# Patient Record
Sex: Female | Born: 1965 | Race: Black or African American | Hispanic: No | Marital: Single | Smoking: Former smoker
Health system: Southern US, Community
[De-identification: ages and names within clinical notes are randomized; demographics above are authoritative.]

## PROBLEM LIST (undated history)

## (undated) DIAGNOSIS — I10 Essential (primary) hypertension: Secondary | ICD-10-CM

## (undated) DIAGNOSIS — E039 Hypothyroidism, unspecified: Secondary | ICD-10-CM

## (undated) DIAGNOSIS — E785 Hyperlipidemia, unspecified: Secondary | ICD-10-CM

## (undated) DIAGNOSIS — I639 Cerebral infarction, unspecified: Secondary | ICD-10-CM

## (undated) DIAGNOSIS — T8859XA Other complications of anesthesia, initial encounter: Secondary | ICD-10-CM

## (undated) DIAGNOSIS — J449 Chronic obstructive pulmonary disease, unspecified: Secondary | ICD-10-CM

## (undated) DIAGNOSIS — N200 Calculus of kidney: Secondary | ICD-10-CM

## (undated) DIAGNOSIS — F329 Major depressive disorder, single episode, unspecified: Secondary | ICD-10-CM

## (undated) DIAGNOSIS — Z9889 Other specified postprocedural states: Secondary | ICD-10-CM

## (undated) DIAGNOSIS — F32A Depression, unspecified: Secondary | ICD-10-CM

## (undated) DIAGNOSIS — R112 Nausea with vomiting, unspecified: Secondary | ICD-10-CM

## (undated) HISTORY — PX: CHOLECYSTECTOMY: SHX55

## (undated) HISTORY — DX: Cerebral infarction, unspecified: I63.9

## (undated) HISTORY — DX: Hyperlipidemia, unspecified: E78.5

## (undated) HISTORY — DX: Depression, unspecified: F32.A

## (undated) HISTORY — DX: Major depressive disorder, single episode, unspecified: F32.9

## (undated) HISTORY — PX: TUBAL LIGATION: SHX77

## (undated) HISTORY — PX: ABDOMINAL HYSTERECTOMY: SHX81

---

## 2001-03-28 ENCOUNTER — Emergency Department (HOSPITAL_COMMUNITY): Admission: EM | Admit: 2001-03-28 | Discharge: 2001-03-28 | Payer: Self-pay | Admitting: Emergency Medicine

## 2001-04-05 ENCOUNTER — Emergency Department (HOSPITAL_COMMUNITY): Admission: EM | Admit: 2001-04-05 | Discharge: 2001-04-05 | Payer: Self-pay | Admitting: Emergency Medicine

## 2001-04-06 ENCOUNTER — Emergency Department (HOSPITAL_COMMUNITY): Admission: EM | Admit: 2001-04-06 | Discharge: 2001-04-06 | Payer: Self-pay | Admitting: Emergency Medicine

## 2001-04-20 ENCOUNTER — Encounter: Payer: Self-pay | Admitting: Emergency Medicine

## 2001-04-20 ENCOUNTER — Emergency Department (HOSPITAL_COMMUNITY): Admission: EM | Admit: 2001-04-20 | Discharge: 2001-04-20 | Payer: Self-pay | Admitting: Emergency Medicine

## 2001-08-01 ENCOUNTER — Encounter: Payer: Self-pay | Admitting: Emergency Medicine

## 2001-08-01 ENCOUNTER — Emergency Department (HOSPITAL_COMMUNITY): Admission: EM | Admit: 2001-08-01 | Discharge: 2001-08-01 | Payer: Self-pay | Admitting: Emergency Medicine

## 2001-11-20 ENCOUNTER — Emergency Department (HOSPITAL_COMMUNITY): Admission: EM | Admit: 2001-11-20 | Discharge: 2001-11-20 | Payer: Self-pay | Admitting: Emergency Medicine

## 2001-12-02 ENCOUNTER — Encounter: Payer: Self-pay | Admitting: Internal Medicine

## 2001-12-02 ENCOUNTER — Ambulatory Visit (HOSPITAL_COMMUNITY): Admission: RE | Admit: 2001-12-02 | Discharge: 2001-12-02 | Payer: Self-pay | Admitting: Internal Medicine

## 2002-03-22 ENCOUNTER — Encounter: Payer: Self-pay | Admitting: Emergency Medicine

## 2002-03-22 ENCOUNTER — Emergency Department (HOSPITAL_COMMUNITY): Admission: EM | Admit: 2002-03-22 | Discharge: 2002-03-22 | Payer: Self-pay | Admitting: Emergency Medicine

## 2003-03-17 ENCOUNTER — Emergency Department (HOSPITAL_COMMUNITY): Admission: EM | Admit: 2003-03-17 | Discharge: 2003-03-17 | Payer: Self-pay | Admitting: Emergency Medicine

## 2003-03-17 ENCOUNTER — Encounter: Payer: Self-pay | Admitting: Emergency Medicine

## 2003-04-13 ENCOUNTER — Encounter: Payer: Self-pay | Admitting: Emergency Medicine

## 2003-04-13 ENCOUNTER — Emergency Department (HOSPITAL_COMMUNITY): Admission: EM | Admit: 2003-04-13 | Discharge: 2003-04-13 | Payer: Self-pay | Admitting: Emergency Medicine

## 2006-08-26 ENCOUNTER — Emergency Department (HOSPITAL_COMMUNITY): Admission: EM | Admit: 2006-08-26 | Discharge: 2006-08-26 | Payer: Self-pay | Admitting: Advanced Practice Midwife

## 2006-08-30 ENCOUNTER — Ambulatory Visit (HOSPITAL_COMMUNITY): Admission: RE | Admit: 2006-08-30 | Discharge: 2006-08-30 | Payer: Self-pay | Admitting: Emergency Medicine

## 2008-02-20 ENCOUNTER — Emergency Department (HOSPITAL_COMMUNITY): Admission: EM | Admit: 2008-02-20 | Discharge: 2008-02-20 | Payer: Self-pay | Admitting: Emergency Medicine

## 2008-05-21 ENCOUNTER — Emergency Department (HOSPITAL_COMMUNITY): Admission: EM | Admit: 2008-05-21 | Discharge: 2008-05-21 | Payer: Self-pay | Admitting: Emergency Medicine

## 2009-10-11 ENCOUNTER — Emergency Department (HOSPITAL_COMMUNITY): Admission: EM | Admit: 2009-10-11 | Discharge: 2009-10-11 | Payer: Self-pay | Admitting: Emergency Medicine

## 2009-11-09 ENCOUNTER — Emergency Department (HOSPITAL_COMMUNITY): Admission: EM | Admit: 2009-11-09 | Discharge: 2009-11-09 | Payer: Self-pay | Admitting: Emergency Medicine

## 2010-05-15 ENCOUNTER — Emergency Department (HOSPITAL_COMMUNITY): Admission: EM | Admit: 2010-05-15 | Discharge: 2010-05-15 | Payer: Self-pay | Admitting: Emergency Medicine

## 2010-08-28 ENCOUNTER — Emergency Department (HOSPITAL_COMMUNITY)
Admission: EM | Admit: 2010-08-28 | Discharge: 2010-08-28 | Disposition: A | Discharge status: Home / Self Care | Payer: Self-pay | Attending: Emergency Medicine | Admitting: Emergency Medicine

## 2010-08-28 DIAGNOSIS — Z87891 Personal history of nicotine dependence: Secondary | ICD-10-CM | POA: Insufficient documentation

## 2010-08-28 DIAGNOSIS — K089 Disorder of teeth and supporting structures, unspecified: Secondary | ICD-10-CM | POA: Insufficient documentation

## 2010-09-09 LAB — URINALYSIS, ROUTINE W REFLEX MICROSCOPIC
Glucose, UA: NEGATIVE mg/dL
Hgb urine dipstick: NEGATIVE
Ketones, ur: NEGATIVE mg/dL
Specific Gravity, Urine: >1.03 — ABNORMAL HIGH (ref 1.005–1.030)
pH: 5.5 (ref 5.0–8.0)

## 2010-09-09 LAB — DIFFERENTIAL
Basophils Absolute: 0.1 10*3/uL (ref 0.0–0.1)
Basophils Relative: 1 % (ref 0–1)
Eosinophils Relative: 1 % (ref 0–5)
Lymphocytes Relative: 25 % (ref 12–46)
Lymphs Abs: 2.2 10*3/uL (ref 0.7–4.0)
Monocytes Absolute: 0.7 10*3/uL (ref 0.1–1.0)
Monocytes Relative: 8 % (ref 3–12)
Neutrophils Relative %: 65 % (ref 43–77)

## 2010-09-09 LAB — CBC
HCT: 20.6 % — ABNORMAL LOW (ref 36.0–46.0)
MCV: 58.5 fL — ABNORMAL LOW (ref 78.0–100.0)
RBC: 3.52 MIL/uL — ABNORMAL LOW (ref 3.87–5.11)
WBC: 8.7 10*3/uL (ref 4.0–10.5)

## 2010-09-09 LAB — BASIC METABOLIC PANEL
CO2: 25 mEq/L (ref 19–32)
Calcium: 9.4 mg/dL (ref 8.4–10.5)
Chloride: 104 mEq/L (ref 96–112)
Glucose, Bld: 98 mg/dL (ref 70–99)

## 2010-10-18 ENCOUNTER — Emergency Department (HOSPITAL_COMMUNITY)
Admission: EM | Admit: 2010-10-18 | Discharge: 2010-10-18 | Disposition: A | Discharge status: Home / Self Care | Payer: Self-pay | Attending: Emergency Medicine | Admitting: Emergency Medicine

## 2010-10-18 DIAGNOSIS — R197 Diarrhea, unspecified: Secondary | ICD-10-CM | POA: Insufficient documentation

## 2010-10-18 DIAGNOSIS — K5289 Other specified noninfective gastroenteritis and colitis: Secondary | ICD-10-CM | POA: Insufficient documentation

## 2010-10-18 DIAGNOSIS — R112 Nausea with vomiting, unspecified: Secondary | ICD-10-CM | POA: Insufficient documentation

## 2010-10-18 LAB — BASIC METABOLIC PANEL
CO2: 24 mEq/L (ref 19–32)
Calcium: 8.8 mg/dL (ref 8.4–10.5)
Chloride: 105 mEq/L (ref 96–112)
Creatinine, Ser: 0.66 mg/dL (ref 0.4–1.2)
GFR calc Af Amer: >60 mL/min (ref >60)
GFR calc non Af Amer: >60 mL/min (ref >60)
Glucose, Bld: 94 mg/dL (ref 70–99)
Sodium: 132 mEq/L — ABNORMAL LOW (ref 135–145)

## 2010-10-18 LAB — URINALYSIS, ROUTINE W REFLEX MICROSCOPIC
Bilirubin Urine: NEGATIVE
Ketones, ur: NEGATIVE mg/dL
Nitrite: NEGATIVE
Protein, ur: NEGATIVE mg/dL
Urobilinogen, UA: 0.2 mg/dL (ref 0.0–1.0)

## 2010-10-18 LAB — URINE MICROSCOPIC-ADD ON

## 2010-10-18 LAB — CBC
HCT: 21.7 % — ABNORMAL LOW (ref 36.0–46.0)
Hemoglobin: 5.6 g/dL — CL (ref 12.0–15.0)
MCH: 16.1 pg — ABNORMAL LOW (ref 26.0–34.0)
MCV: 62.5 fL — ABNORMAL LOW (ref 78.0–100.0)
RBC: 3.47 MIL/uL — ABNORMAL LOW (ref 3.87–5.11)
RDW: 20.9 % — ABNORMAL HIGH (ref 11.5–15.5)

## 2010-10-18 LAB — DIFFERENTIAL
Basophils Absolute: 0 10*3/uL (ref 0.0–0.1)
Lymphocytes Relative: 40 % (ref 12–46)

## 2010-10-19 LAB — IRON AND TIBC

## 2010-10-20 LAB — URINE CULTURE
Colony Count: NO GROWTH
Culture  Setup Time: 201204221937

## 2010-10-23 ENCOUNTER — Inpatient Hospital Stay (HOSPITAL_COMMUNITY)
Admission: RE | Admit: 2010-10-23 | Discharge: 2010-10-24 | DRG: 812 | Disposition: A | Discharge status: Home / Self Care | Payer: Self-pay | Source: Ambulatory Visit | Attending: Obstetrics & Gynecology | Admitting: Obstetrics & Gynecology

## 2010-10-23 ENCOUNTER — Other Ambulatory Visit: Payer: Self-pay | Admitting: Obstetrics & Gynecology

## 2010-10-23 ENCOUNTER — Other Ambulatory Visit (HOSPITAL_COMMUNITY)
Admission: RE | Admit: 2010-10-23 | Discharge: 2010-10-23 | Disposition: A | Discharge status: Home / Self Care | Payer: Self-pay | Source: Ambulatory Visit | Attending: Obstetrics & Gynecology | Admitting: Obstetrics & Gynecology

## 2010-10-23 DIAGNOSIS — D259 Leiomyoma of uterus, unspecified: Secondary | ICD-10-CM | POA: Diagnosis present

## 2010-10-23 DIAGNOSIS — R87810 Cervical high risk human papillomavirus (HPV) DNA test positive: Secondary | ICD-10-CM | POA: Insufficient documentation

## 2010-10-23 DIAGNOSIS — D649 Anemia, unspecified: Principal | ICD-10-CM | POA: Diagnosis present

## 2010-10-23 DIAGNOSIS — Z124 Encounter for screening for malignant neoplasm of cervix: Secondary | ICD-10-CM | POA: Insufficient documentation

## 2010-10-23 LAB — CBC
MCH: 15.9 pg — ABNORMAL LOW (ref 26.0–34.0)
MCHC: 25.4 g/dL — ABNORMAL LOW (ref 30.0–36.0)
MCV: 62.5 fL — ABNORMAL LOW (ref 78.0–100.0)
Platelets: 371 10*3/uL (ref 150–400)

## 2010-10-23 LAB — COMPREHENSIVE METABOLIC PANEL
AST: 15 U/L (ref 0–37)
Albumin: 3.5 g/dL (ref 3.5–5.2)
Alkaline Phosphatase: 66 U/L (ref 39–117)
CO2: 24 mEq/L (ref 19–32)
Creatinine, Ser: 0.67 mg/dL (ref 0.4–1.2)
GFR calc non Af Amer: >60 mL/min (ref >60)
Glucose, Bld: 112 mg/dL — ABNORMAL HIGH (ref 70–99)
Potassium: 4 mEq/L (ref 3.5–5.1)

## 2010-10-23 LAB — DIFFERENTIAL
Basophils Absolute: 0 10*3/uL (ref 0.0–0.1)
Eosinophils Absolute: 0.2 10*3/uL (ref 0.0–0.7)
Eosinophils Relative: 3 % (ref 0–5)
Lymphocytes Relative: 31 % (ref 12–46)
Lymphs Abs: 2.3 10*3/uL (ref 0.7–4.0)

## 2010-10-23 LAB — HCG, QUANTITATIVE, PREGNANCY

## 2010-10-25 LAB — CROSSMATCH
ABO/RH(D): B POS
Unit division: 0
Unit division: 0

## 2010-10-28 ENCOUNTER — Other Ambulatory Visit: Payer: Self-pay | Admitting: Obstetrics & Gynecology

## 2010-10-28 ENCOUNTER — Encounter (HOSPITAL_COMMUNITY): Payer: Self-pay

## 2010-10-28 LAB — URINALYSIS, ROUTINE W REFLEX MICROSCOPIC
Glucose, UA: NEGATIVE mg/dL
Hgb urine dipstick: NEGATIVE
Specific Gravity, Urine: 1.01 (ref 1.005–1.030)
pH: 6 (ref 5.0–8.0)

## 2010-10-28 LAB — COMPREHENSIVE METABOLIC PANEL
ALT: 11 U/L (ref 0–35)
AST: 15 U/L (ref 0–37)
Albumin: 3.9 g/dL (ref 3.5–5.2)
Alkaline Phosphatase: 88 U/L (ref 39–117)
CO2: 25 mEq/L (ref 19–32)
Chloride: 102 mEq/L (ref 96–112)
Creatinine, Ser: 0.6 mg/dL (ref 0.4–1.2)
GFR calc Af Amer: >60 mL/min (ref >60)
GFR calc non Af Amer: >60 mL/min (ref >60)
Potassium: 3.9 mEq/L (ref 3.5–5.1)
Sodium: 137 mEq/L (ref 135–145)
Total Bilirubin: 0.8 mg/dL (ref 0.3–1.2)

## 2010-10-28 LAB — CBC
HCT: 33.1 % — ABNORMAL LOW (ref 36.0–46.0)
MCH: 19.8 pg — ABNORMAL LOW (ref 26.0–34.0)
MCV: 69.8 fL — ABNORMAL LOW (ref 78.0–100.0)
RDW: 26.7 % — ABNORMAL HIGH (ref 11.5–15.5)
WBC: 6.3 10*3/uL (ref 4.0–10.5)

## 2010-10-29 ENCOUNTER — Other Ambulatory Visit: Payer: Self-pay | Admitting: Obstetrics & Gynecology

## 2010-10-29 ENCOUNTER — Ambulatory Visit (HOSPITAL_COMMUNITY)
Admission: RE | Admit: 2010-10-29 | Discharge: 2010-10-29 | Disposition: A | Discharge status: Home / Self Care | Payer: Self-pay | Source: Ambulatory Visit | Attending: Obstetrics & Gynecology | Admitting: Obstetrics & Gynecology

## 2010-10-29 DIAGNOSIS — D509 Iron deficiency anemia, unspecified: Secondary | ICD-10-CM | POA: Insufficient documentation

## 2010-10-29 DIAGNOSIS — D259 Leiomyoma of uterus, unspecified: Secondary | ICD-10-CM | POA: Insufficient documentation

## 2010-10-29 DIAGNOSIS — N920 Excessive and frequent menstruation with regular cycle: Secondary | ICD-10-CM | POA: Insufficient documentation

## 2010-10-29 DIAGNOSIS — N838 Other noninflammatory disorders of ovary, fallopian tube and broad ligament: Secondary | ICD-10-CM | POA: Insufficient documentation

## 2010-11-02 LAB — TYPE AND SCREEN
ABO/RH(D): B POS
Antibody Screen: NEGATIVE
Unit division: 0
Unit division: 0

## 2010-11-21 NOTE — Op Note (Signed)
Barbara Snow, Barbara Snow              ACCOUNT NO.:  [id number]  MEDICAL RECORD NO.:  [id number]           PATIENT TYPE:  O  LOCATION:  [facility]YP                          FACILITY:  [facility]  PHYSICIAN:  Lazaro Arms, M.D.   DATE OF BIRTH:  13-Jun-1966  DATE OF PROCEDURE:  10/29/2010 DATE OF DISCHARGE:  10/29/2010                              OPERATIVE REPORT   PREOPERATIVE DIAGNOSES: 1. Menometrorrhagia. 2. Severe anemia requiring hospitalization a couple weeks ago with     transfusion. 3. Fibroids.  POSTOPERATIVE DIAGNOSES: 1. Menometrorrhagia. 2. Severe anemia requiring hospitalization a couple weeks ago with     transfusion. 3. Fibroids. 4. Left paratubal cyst.  PROCEDURES:  Laparoscopic supracervical hysterectomy and removal of left paratubal cyst.  SURGEON:  Lazaro Arms, MD  ASSISTANT:  Tilda Burrow, MD  ANESTHESIA:  General endotracheal.  FINDINGS:  The patient had small fibroids that were known by ultrasound from the office.  She actually had not been to the office for quite some time.  She came in with a hemoglobin of 6 and was symptomatic and I put her in the hospital overnight and gave her actually 3 units of packed red blood cells to increase her hemoglobin to an appropriate level.  I also placed her on high dose of megestrol with iron to keep her from bleeding until we got her to surgery.  Because of need for transfusion and small fibroids, I decided to proceed with a supracervical hysterectomy.  At the time of surgery, there were no unusual findings at the left paratubal cyst.  Both ovaries were otherwise normal.  DESCRIPTION OF OPERATION:  The patient was taken to the operating room, placed in supine position, underwent general endotracheal anesthesia, placed in low lithotomy position, prepped and draped in usual sterile fashion for laparoscopic surgery.  Incision was made in the umbilicus and a Veress needle was used and placed into the peritoneal cavity  with one pass without difficulty and the peritoneal cavity was insufflated. The nonbladed video laparoscope was then placed into the peritoneal cavity with one pass under direct visualization without difficulty. Incisions were made in both lower quadrants and a 12-mm trocar was placed in the left lower quadrant and a 10-mm trocar was placed in right lower quadrant, both under direct visualization without difficulty.  The Harmonic scalpel was used and counter traction employed.  The right round ligament was ligated.  The utero-ovarian ligament was ligated. The peritoneum was incised and the bladder flap was created, both anteriorly and posteriorly.  The ureter was identified and found to be well away to dissection.  The left round ligament was suture ligated with Harmonic scalpel and the uterine vessels in left were skeletonized and the vesicouterine serosal flap was created.  The uterine vessels were transected using the Harmonic scalpel bilaterally without difficulty.  The bladder again was pushed well out the area of the dissection.  Two additional pedicles were taken down, the cardinal ligament medial to the uterine vessels on both sides and then the uterus and portion of the cervix removed with the Harmonic scalpel without difficulty.  A small probably 1.5  cm cervical stump remained.  The evening hemostasis was achieved in all coagulated pedicles.  The morcellator was then placed in the left lower quadrant and the uterus was then removed without difficulty.  There was also a paratubal cyst that was removed in strips, multiple passes were taken and all of the uterus was removed.  Both ovaries were found to be normal and left in situ again all pedicles were found to be hemostatic.  Pelvis was irrigated vigorously.  Pressure was taken in all pedicles for hemostatic and a small amount of fluid was left inside the peritoneal cavity to diminish postoperative adhesion formation.  The patient  tolerated the procedure well.  She received Ancef and Toradol prophylactically preoperatively.  The instruments were removed.  The fascia was closed of all 3 incisions without difficulty and the incisions were closed with skin staples.  The patient tolerated the procedure well.  She had experienced about 50 mL of blood loss and was taken to the recovery room in good and stable condition.  All counts were correct x3.     Lazaro Arms, M.D.     [name]  D:  11/19/2010  T:  11/19/2010  Job:  [id number]  Electronically Signed by Duane Lope M.D. on 11/21/2010 09:42:27 AM

## 2010-12-31 NOTE — H&P (Signed)
Barbara Snow NO.:  [id number]  MEDICAL RECORD NO.:  [id number]  LOCATION:                               FACILITY:  [facility]  PHYSICIAN:  Lazaro Arms, M.D.   DATE OF BIRTH:  [birth year]  DATE OF ADMISSION:  10/23/2010 DATE OF DISCHARGE:  04/26/2012LH                             HISTORY & PHYSICAL   HISTORY OF PRESENT ILLNESS:  Barbara Snow is a 45 year old lady who is known to have enlarged fibroids for some time actually been in very heavy menstrual period.  She was seen over at the health department and sent over to and had a hemoglobin, was symptomatic and had a hemoglobin here in the office of 5.9 as a result sent her over to the hospital to be transfused.  The patient has not been on Megace recently and has not had follow up with [place] in some time.  PAST MEDICAL HISTORY:  Anemia, fibroids, borderline hypertension, and review of systems otherwise negative.  PAST OB HISTORY:  Vaginal deliveries.  PAST SURGICAL HISTORY:  Negative.  ALLERGIES:  None.  MEDICATIONS:  Iron.  PHYSICAL EXAMINATION:  HEENT:  Unremarkable. NECK:  Thyroid is normal. LUNGS:  Clear. HEART:  Regular rhythm with 2/6 systolic ejection murmur. ABDOMEN:  Benign with fibroids palpable 18 weeks' size. EXTREMITIES:  Warm and no edema. NEUROLOGICAL:  Grossly intact. PELVIC:  Reveals enlarged fibroid uterus.  IMPRESSION: 1. Enlarged fibroid uterus. 2. Menometrorrhagia. 3. Severe anemia, symptomatic.  PLAN:  The patient is admitted for admission for 3 units of packed red blood cells followed by Lasix and preparations will be made for her to undergo a hysterectomy in the coming weeks.  The patient and her family understand the indication for the transfusion and will proceed.     Lazaro Arms, M.D.     [name]  D:  12/30/2010  T:  12/31/2010  Job:  [id number]

## 2010-12-31 NOTE — Discharge Summary (Signed)
  Barbara Snow, Barbara Snow              ACCOUNT NO.:  [id number]  MEDICAL RECORD NO.:  [id number]  LOCATION:  A330                          FACILITY:  [facility]  PHYSICIAN:  Lazaro Arms, M.D.   DATE OF BIRTH:  09-Aug-1965  DATE OF ADMISSION:  10/23/2010 DATE OF DISCHARGE:  04/27/2012LH                              DISCHARGE SUMMARY   DISCHARGE DIAGNOSES: 1. Status post transfusion of 3 units of packed red blood cells. 2. Symptomatic anemia.  Please refer to the history and physical details from the hospital.  HOSPITAL COURSE:  The patient was admitted for transfusion of 3 units of packed red blood cells because of symptomatic anemia down to 5.5 and we gave her 3 units.  She is up to 9 now.  She feels much better. Preparations were made to  undergo hysterectomy in the coming weeks. She was discharged home on iron and Megace.     Lazaro Arms, M.D.     [name]  D:  12/30/2010  T:  12/31/2010  Job:  [id number]  Electronically Signed by Duane Lope M.D. on 12/31/2010 04:45:02 PM

## 2011-01-16 ENCOUNTER — Encounter: Payer: Self-pay | Admitting: *Deleted

## 2011-01-16 ENCOUNTER — Emergency Department (HOSPITAL_COMMUNITY)
Admission: EM | Admit: 2011-01-16 | Discharge: 2011-01-16 | Disposition: A | Discharge status: Home / Self Care | Payer: Self-pay | Attending: Emergency Medicine | Admitting: Emergency Medicine

## 2011-01-16 DIAGNOSIS — K029 Dental caries, unspecified: Secondary | ICD-10-CM | POA: Insufficient documentation

## 2011-01-16 DIAGNOSIS — Z87891 Personal history of nicotine dependence: Secondary | ICD-10-CM | POA: Insufficient documentation

## 2011-01-16 DIAGNOSIS — K047 Periapical abscess without sinus: Secondary | ICD-10-CM | POA: Insufficient documentation

## 2011-01-16 MED ORDER — IBUPROFEN 800 MG PO TABS
800.0000 mg | ORAL_TABLET | Freq: Once | ORAL | Status: AC
  Administered 2011-01-16: 800 mg via ORAL
  Filled 2011-01-16: qty 1

## 2011-01-16 MED ORDER — ONDANSETRON HCL 4 MG PO TABS
4.0000 mg | ORAL_TABLET | Freq: Once | ORAL | Status: AC
  Administered 2011-01-16: 4 mg via ORAL
  Filled 2011-01-16: qty 1

## 2011-01-16 MED ORDER — HYDROCODONE-ACETAMINOPHEN 5-325 MG PO TABS
2.0000 | ORAL_TABLET | Freq: Once | ORAL | Status: AC
  Administered 2011-01-16: 2 via ORAL
  Filled 2011-01-16: qty 2

## 2011-01-16 MED ORDER — PENICILLIN V POTASSIUM 250 MG PO TABS
500.0000 mg | ORAL_TABLET | Freq: Once | ORAL | Status: AC
  Administered 2011-01-16: 500 mg via ORAL
  Filled 2011-01-16: qty 2

## 2011-01-16 MED ORDER — AMOXICILLIN 500 MG PO CAPS: ORAL_CAPSULE | ORAL | Status: DC

## 2011-01-16 MED ORDER — HYDROCODONE-ACETAMINOPHEN 5-325 MG PO TABS: ORAL_TABLET | ORAL | Status: DC

## 2011-01-16 NOTE — ED Provider Notes (Addendum)
History     Chief Complaint  Patient presents with  . Abscess   Patient is a 45 y.o. female presenting with tooth pain. The history is provided by the patient.  Dental PainThe primary symptoms include mouth pain and headaches. Primary symptoms do not include fever, shortness of breath or cough. The symptoms began 3 to 5 days ago. The symptoms are worsening. The symptoms occur constantly.  The headache is not associated with photophobia.  Additional symptoms include: dental sensitivity to temperature, gum swelling and gum tenderness. Additional symptoms do not include: trouble swallowing and nosebleeds.    History reviewed. No pertinent past medical history.  Past Surgical History  Procedure Date  . Abdominal hysterectomy   . Cholecystectomy   . Tubal ligation     History reviewed. No pertinent family history.  History  Substance Use Topics  . Smoking status: Former Games developer  . Smokeless tobacco: Not on file  . Alcohol Use: No    OB History    Grav Para Term Preterm Abortions TAB SAB Ect Mult Living                  Review of Systems  Constitutional: Negative for fever and activity change.       All ROS Neg except as noted in HPI  HENT: Positive for dental problem. Negative for nosebleeds, trouble swallowing and neck pain.   Eyes: Negative for photophobia and discharge.  Respiratory: Negative for cough, shortness of breath and wheezing.   Cardiovascular: Negative for chest pain and palpitations.  Gastrointestinal: Negative for abdominal pain and blood in stool.  Genitourinary: Negative for dysuria, frequency and hematuria.  Musculoskeletal: Negative for back pain and arthralgias.  Skin: Negative.   Neurological: Positive for headaches. Negative for dizziness, seizures and speech difficulty.  Psychiatric/Behavioral: Negative for hallucinations and confusion.    Physical Exam  BP 152/75  Pulse 60  Temp(Src) 98.6 F (37 C) (Oral)  Resp 20  Ht 5\' 6"  (1.676 m)  Wt  230 lb (104.327 kg)  BMI 37.12 kg/m2  SpO2 100%  Physical Exam  Nursing note and vitals reviewed. Constitutional: She is oriented to person, place, and time. She appears well-developed and well-nourished.  Non-toxic appearance.  HENT:  Head: Normocephalic.  Right Ear: Tympanic membrane and external ear normal.  Left Ear: Tympanic membrane and external ear normal.  Mouth/Throat: Dental abscesses and dental caries present.  Eyes: EOM and lids are normal. Pupils are equal, round, and reactive to light.  Neck: Normal range of motion. Neck supple. Carotid bruit is not present.  Cardiovascular: Normal rate, regular rhythm, normal heart sounds, intact distal pulses and normal pulses.   Pulmonary/Chest: Breath sounds normal. No respiratory distress.  Abdominal: Soft. Bowel sounds are normal. There is no tenderness. There is no guarding.  Musculoskeletal: Normal range of motion.  Lymphadenopathy:       Head (right side): No submandibular adenopathy present.       Head (left side): No submandibular adenopathy present.    She has no cervical adenopathy.  Neurological: She is alert and oriented to person, place, and time. She has normal strength. No cranial nerve deficit or sensory deficit.  Skin: Skin is warm and dry.  Psychiatric: She has a normal mood and affect. Her speech is normal.    ED Course  Procedures  MDM I have reviewed nursing notes, vital signs, and all appropriate lab and imaging results for this patient.      Kathie Dike, [place] 01/16/11  2321 Medical screening examination/treatment/procedure(s) were performed by non-physician practitioner and as supervising physician I was immediately available for consultation/collaboration. Nicoletta Dress. Colon Branch, MD 01/24/11 1610  Nicoletta Dress. Colon Branch, MD 02/19/11 [phone]

## 2011-01-16 NOTE — ED Notes (Signed)
Pt c/o abscess to left upper tooth that started yesterday;

## 2011-03-31 LAB — URINE MICROSCOPIC-ADD ON

## 2011-03-31 LAB — BASIC METABOLIC PANEL
Chloride: 107
GFR calc non Af Amer: >60
Potassium: 3.6
Sodium: 135

## 2011-03-31 LAB — PREGNANCY, URINE: Preg Test, Ur: NEGATIVE

## 2011-03-31 LAB — DIFFERENTIAL
Eosinophils Relative: 3
Lymphocytes Relative: 34
Lymphs Abs: 2
Monocytes Absolute: 0.5
Monocytes Relative: 9

## 2011-03-31 LAB — CBC
HCT: 19.8 — ABNORMAL LOW
Hemoglobin: 6.1 — CL
WBC: 5.9

## 2011-03-31 LAB — URINALYSIS, ROUTINE W REFLEX MICROSCOPIC
Ketones, ur: NEGATIVE
Leukocytes, UA: NEGATIVE
Nitrite: NEGATIVE
Specific Gravity, Urine: 1.025
pH: 5

## 2011-06-06 ENCOUNTER — Emergency Department (HOSPITAL_COMMUNITY): Payer: Self-pay

## 2011-06-06 ENCOUNTER — Encounter (HOSPITAL_COMMUNITY): Payer: Self-pay | Admitting: *Deleted

## 2011-06-06 ENCOUNTER — Emergency Department (HOSPITAL_COMMUNITY)
Admission: EM | Admit: 2011-06-06 | Discharge: 2011-06-06 | Disposition: A | Discharge status: Home / Self Care | Payer: Self-pay | Attending: Emergency Medicine | Admitting: Emergency Medicine

## 2011-06-06 DIAGNOSIS — M545 Low back pain, unspecified: Secondary | ICD-10-CM | POA: Insufficient documentation

## 2011-06-06 DIAGNOSIS — Y9269 Other specified industrial and construction area as the place of occurrence of the external cause: Secondary | ICD-10-CM | POA: Insufficient documentation

## 2011-06-06 DIAGNOSIS — Z87891 Personal history of nicotine dependence: Secondary | ICD-10-CM | POA: Insufficient documentation

## 2011-06-06 DIAGNOSIS — X500XXA Overexertion from strenuous movement or load, initial encounter: Secondary | ICD-10-CM | POA: Insufficient documentation

## 2011-06-06 DIAGNOSIS — S335XXA Sprain of ligaments of lumbar spine, initial encounter: Secondary | ICD-10-CM | POA: Insufficient documentation

## 2011-06-06 DIAGNOSIS — S39012A Strain of muscle, fascia and tendon of lower back, initial encounter: Secondary | ICD-10-CM

## 2011-06-06 MED ORDER — OXYCODONE-ACETAMINOPHEN 5-325 MG PO TABS: 1.0000 | ORAL_TABLET | ORAL | Status: AC | PRN

## 2011-06-06 MED ORDER — KETOROLAC TROMETHAMINE 60 MG/2ML IM SOLN
60.0000 mg | Freq: Once | INTRAMUSCULAR | Status: AC
  Administered 2011-06-06: 60 mg via INTRAMUSCULAR
  Filled 2011-06-06: qty 2

## 2011-06-06 MED ORDER — HYDROMORPHONE HCL PF 1 MG/ML IJ SOLN
1.0000 mg | Freq: Once | INTRAMUSCULAR | Status: AC
  Administered 2011-06-06: 1 mg via INTRAMUSCULAR
  Filled 2011-06-06: qty 1

## 2011-06-06 MED ORDER — CYCLOBENZAPRINE HCL 5 MG PO TABS: 5.0000 mg | ORAL_TABLET | Freq: Three times a day (TID) | ORAL | Status: AC | PRN

## 2011-06-06 MED ORDER — PREDNISONE 10 MG PO TABS: 60.0000 mg | ORAL_TABLET | Freq: Every day | ORAL | Status: DC

## 2011-06-06 NOTE — ED Notes (Signed)
Pt a/ox4. Resp even and unlabored. NAD at this time. D/C instructions reviewed with pt. Pt verbalized understanding. Pt ambulated to lobby with steady gate.  

## 2011-06-06 NOTE — ED Notes (Addendum)
Pt presents with low back pain that radiates down left leg. Pt states pain started approximately 2 weeks ago. Pt states she was at work and pushing/ pulling when pain started. Pt states she feels muscle cramping and is unable to lay on left side. Pt denies loss of bowel or bladder.

## 2011-06-06 NOTE — ED Provider Notes (Signed)
History     CSN: [id number] Arrival date & time: 06/06/2011 11:46 AM   First MD Initiated Contact with Patient 06/06/11 1245      Chief Complaint  Patient presents with  . Back Pain    (Consider location/radiation/quality/duration/timing/severity/associated sxs/prior treatment) Patient is a 45 y.o. female presenting with back pain. The history is provided by the patient.  Back Pain  This is a new problem. The current episode started more than 1 week ago. The problem occurs constantly. The problem has been gradually worsening. The pain is associated with no known injury (Denies specific injury,  but works as cna,  often  having to lift heavy patients.). The pain is present in the lumbar spine. The quality of the pain is described as shooting and aching. The pain radiates to the left thigh. The pain is at a severity of 10/10. The pain is severe. The symptoms are aggravated by bending, twisting and certain positions. The pain is the same all the time. Pertinent negatives include no chest pain, no fever, no numbness, no headaches, no abdominal pain, no abdominal swelling, no bowel incontinence, no perianal numbness, no dysuria, no pelvic pain, no leg pain, no paresthesias, no paresis, no tingling and no weakness. She has tried analgesics for the symptoms. The treatment provided no relief.    History reviewed. No pertinent past medical history.  Past Surgical History  Procedure Date  . Abdominal hysterectomy   . Cholecystectomy   . Tubal ligation     History reviewed. No pertinent family history.  History  Substance Use Topics  . Smoking status: Former Games developer  . Smokeless tobacco: Not on file  . Alcohol Use: No    OB History    Grav Para Term Preterm Abortions TAB SAB Ect Mult Living                  Review of Systems  Constitutional: Negative for fever.  HENT: Negative for congestion, sore throat and neck pain.   Eyes: Negative.   Respiratory: Negative for chest tightness  and shortness of breath.   Cardiovascular: Negative for chest pain.  Gastrointestinal: Negative for nausea, abdominal pain and bowel incontinence.  Genitourinary: Negative.  Negative for dysuria and pelvic pain.  Musculoskeletal: Positive for back pain and arthralgias. Negative for joint swelling.  Skin: Negative.  Negative for rash and wound.  Neurological: Negative for dizziness, tingling, weakness, light-headedness, numbness, headaches and paresthesias.  Hematological: Negative.   Psychiatric/Behavioral: Negative.     Allergies  Review of patient's allergies indicates no known allergies.  Home Medications   Current Outpatient Rx  Name Route Sig Dispense Refill  . CYCLOBENZAPRINE HCL 5 MG PO TABS Oral Take 1 tablet (5 mg total) by mouth 3 (three) times daily as needed for muscle spasms. 15 tablet 0  . IBUPROFEN 200 MG PO TABS Oral Take 200 mg by mouth every 6 (six) hours as needed. For pain    . OXYCODONE-ACETAMINOPHEN 5-325 MG PO TABS Oral Take 1 tablet by mouth every 4 (four) hours as needed for pain. 20 tablet 0  . PREDNISONE 10 MG PO TABS Oral Take 6 tablets (60 mg total) by mouth daily. 15 tablet 0    BP 156/84  Pulse 63  Temp(Src) 98.1 F (36.7 C) (Oral)  Resp 18  Ht 5\' 6"  (1.676 m)  Wt 210 lb (95.255 kg)  BMI 33.89 kg/m2  SpO2 100%  Physical Exam  Nursing note and vitals reviewed. Constitutional: She is oriented to person,  place, and time. She appears well-developed and well-nourished. She appears distressed.  HENT:  Head: Normocephalic and atraumatic.  Eyes: Conjunctivae are normal.  Neck: Normal range of motion. Neck supple.  Cardiovascular: Normal rate, regular rhythm, normal heart sounds and intact distal pulses.        Pedal pulses normal.  Pulmonary/Chest: Effort normal and breath sounds normal. She has no wheezes.  Abdominal: Soft. Bowel sounds are normal. She exhibits no distension and no mass. There is no tenderness.  Musculoskeletal: Normal range of  motion. She exhibits tenderness. She exhibits no edema.       Lumbar back: She exhibits tenderness. She exhibits no swelling, no edema and no spasm.  Neurological: She is alert and oriented to person, place, and time. She has normal strength. She displays no atrophy, no tremor and normal reflexes. No cranial nerve deficit or sensory deficit. She exhibits normal muscle tone. Gait normal.  Reflex Scores:      Patellar reflexes are 2+ on the right side and 2+ on the left side.      Achilles reflexes are 2+ on the right side and 2+ on the left side.      No strength deficit noted in hip and knee flexor and extensor muscle groups.  Ankle flexion and extension intact.  Gait is normal but slow,  No foot drop.  Skin: Skin is warm and dry.  Psychiatric: She has a normal mood and affect.    ED Course  Procedures (including critical care time)  Labs Reviewed - No data to display Dg Lumbar Spine Complete  06/06/2011  *RADIOLOGY REPORT*  Clinical Data: Low back pain  LUMBAR SPINE - COMPLETE 4+ VIEW  Comparison: CT abdomen pelvis dated 08/26/2006  Findings: Five lumbar-type vertebral bodies.  Normal lumbar lordosis.  No evidence of fracture dislocation.  Vertebral body heights and into to the spaces are preserved.  Cholecystectomy clips.  IMPRESSION: No fracture or dislocation is seen.  Original Report Authenticated By: Charline Bills, M.D.     1. Lumbar strain       MDM  Low back pain without neuro deficits on exam or by history.  Patients symptoms improving at time of dc.  Dilaudid 1mg  IM,  toradol 60 IM.  Ambulated at dc,  Friend driving.  Prescribed oxycodone,  Flexeril,  Prednisone         Candis Musa, [place] 06/06/11 2222

## 2011-06-06 NOTE — ED Notes (Signed)
C/o back pain x 2 weeks, patient not sure how she hurt her back, states that she has been working a lot more and does a lot of pulling on her job

## 2011-06-08 NOTE — ED Provider Notes (Signed)
Medical screening examination/treatment/procedure(s) were performed by non-physician practitioner and as supervising physician I was immediately available for consultation/collaboration.   Belicia Difatta L Kameo Bains, MD 06/08/11 0901 

## 2011-07-14 ENCOUNTER — Emergency Department (HOSPITAL_COMMUNITY): Payer: Self-pay

## 2011-07-14 ENCOUNTER — Encounter (HOSPITAL_COMMUNITY): Payer: Self-pay | Admitting: *Deleted

## 2011-07-14 ENCOUNTER — Emergency Department (HOSPITAL_COMMUNITY)
Admission: EM | Admit: 2011-07-14 | Discharge: 2011-07-14 | Disposition: A | Discharge status: Home / Self Care | Payer: Self-pay | Attending: Emergency Medicine | Admitting: Emergency Medicine

## 2011-07-14 DIAGNOSIS — Z87891 Personal history of nicotine dependence: Secondary | ICD-10-CM | POA: Insufficient documentation

## 2011-07-14 DIAGNOSIS — R51 Headache: Secondary | ICD-10-CM | POA: Insufficient documentation

## 2011-07-14 DIAGNOSIS — Z9851 Tubal ligation status: Secondary | ICD-10-CM | POA: Insufficient documentation

## 2011-07-14 DIAGNOSIS — Z9889 Other specified postprocedural states: Secondary | ICD-10-CM | POA: Insufficient documentation

## 2011-07-14 DIAGNOSIS — R700 Elevated erythrocyte sedimentation rate: Secondary | ICD-10-CM | POA: Insufficient documentation

## 2011-07-14 DIAGNOSIS — Z9079 Acquired absence of other genital organ(s): Secondary | ICD-10-CM | POA: Insufficient documentation

## 2011-07-14 MED ORDER — PROMETHAZINE HCL 25 MG/ML IJ SOLN
25.0000 mg | Freq: Once | INTRAMUSCULAR | Status: AC
  Administered 2011-07-14: 25 mg via INTRAMUSCULAR
  Filled 2011-07-14: qty 1

## 2011-07-14 MED ORDER — KETOROLAC TROMETHAMINE 30 MG/ML IJ SOLN
30.0000 mg | Freq: Once | INTRAMUSCULAR | Status: AC
  Administered 2011-07-14: 30 mg via INTRAMUSCULAR
  Filled 2011-07-14: qty 1

## 2011-07-14 MED ORDER — PROMETHAZINE HCL 25 MG PO TABS: 25.0000 mg | ORAL_TABLET | Freq: Four times a day (QID) | ORAL | Status: DC | PRN

## 2011-07-14 NOTE — ED Notes (Addendum)
Pt back from CT> sitting on side of bed. Pt still c/o headache to lt side. Denies vision changes. A/o x3. resp even/nonlabored. nad noted.

## 2011-07-14 NOTE — ED Provider Notes (Signed)
History    This chart was scribed for [employer]. Rubin Payor, MD, MD by Smitty Pluck. The patient was seen in room [facility] and the patient's care was started at 5:28PM.  CSN: [id number]  Arrival date & time 07/14/11  1702   First MD Initiated Contact with Patient 07/14/11 1723      Chief Complaint  Patient presents with  . Headache    (Consider location/radiation/quality/duration/timing/severity/associated sxs/prior treatment) Patient is a 46 y.o. female presenting with headaches. The history is provided by the patient.  Headache    Barbara Snow is a 46 y.o. female who presents to the Emergency Department complaining of moderate waxing and waning headache onset 2 months ago. Pt reports the headache starts on the left side of forehead and radiates towards left ear. Pt denies hearing problems, rashes nausea and vomiting. Pt reports visual problems. She denies injury. No visual changes. No trauma. Fevers. No relief with pain medications.  History reviewed. No pertinent past medical history.  Past Surgical History  Procedure Date  . Abdominal hysterectomy   . Cholecystectomy   . Tubal ligation     History reviewed. No pertinent family history.  History  Substance Use Topics  . Smoking status: Former Games developer  . Smokeless tobacco: Not on file  . Alcohol Use: No    OB History    Grav Para Term Preterm Abortions TAB SAB Ect Mult Living                  Review of Systems  Neurological: Positive for headaches.  All other systems reviewed and are negative.  10 Systems reviewed and are negative for acute change except as noted in the HPI.   Allergies  Review of patient's allergies indicates no known allergies.  Home Medications   Current Outpatient Rx  Name Route Sig Dispense Refill  . PROMETHAZINE HCL 25 MG PO TABS Oral Take 1 tablet (25 mg total) by mouth every 6 (six) hours as needed (headache). 15 tablet 0    BP 151/74  Pulse 68  Temp(Src) 98.5 F (36.9 C)  (Oral)  Resp 16  Ht 5\' 6"  (1.676 m)  Wt 220 lb (99.791 kg)  BMI 35.51 kg/m2  SpO2 100%  Physical Exam  Nursing note and vitals reviewed. Constitutional: She is oriented to person, place, and time. She appears well-developed and well-nourished. No distress.  HENT:  Head: Normocephalic and atraumatic.       Tenderness over temporal area  No fullness of temporal area  Eyes: Conjunctivae and EOM are normal. Pupils are equal, round, and reactive to light.  Neck: Normal range of motion. No tracheal deviation present. No thyromegaly present.  Cardiovascular: Normal rate, regular rhythm and normal heart sounds.   Pulmonary/Chest: Effort normal and breath sounds normal. No respiratory distress. She has no wheezes.  Abdominal: She exhibits no distension. There is no tenderness.  Musculoskeletal: Normal range of motion.  Neurological: She is alert and oriented to person, place, and time.  Skin: Skin is warm.  Psychiatric: She has a normal mood and affect. Her behavior is normal.    ED Course  Procedures (including critical care time)  DIAGNOSTIC STUDIES: Oxygen Saturation is 100% on room air, normal by my interpretation.    COORDINATION OF CARE:  7:45PM EDP order medication: Phenergan injection (25mg ) and Toradol injection (30mg /ml)   Labs Reviewed  SEDIMENTATION RATE - Abnormal; Notable for the following:    Sed Rate 29 (*)    All other components within  normal limits   Ct Head Wo Contrast  07/14/2011  *RADIOLOGY REPORT*  Clinical Data: Left sided headache for 2 months.  CT HEAD WITHOUT CONTRAST  Technique:  Contiguous axial images were obtained from the base of the skull through the vertex without contrast.  Comparison: None.  Findings: No intracranial hemorrhage.  Small hypodensity left lenticular nucleus/caudate head with an appearance most suggestive of remote infarct.  The appearance is not typical for a mass or result of demyelinating process.  If further delineation is  clinically desired, MR imaging can be performed.  No CT evidence of large acute infarct.  Small acute infarct cannot be excluded by CT.  No hydrocephalus.  Partially empty sella.  This may be an incidental finding although also described in patients with pseudotumor cerebri.  Calcified polypoid structure inferior aspect of the right maxillary sinus incompletely assessed on the present exam.  Remainder of visualized paranasal sinuses and mastoid air cells are clear.  IMPRESSION: No intracranial hemorrhage or CT evidence of large acute infarct.  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  Original Report Authenticated By: Fuller Canada, M.D.     1. Headache       MDM  Left-sided headache for the last couple months. Tenderness over left temporal area. Normal neural examination. Sedimentation rate is mildly elevated but not at the level where I be concerned about temporal arteritis. Also at her age she is of low risk. 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 This could be incidental but also with possible pseudotumor cerebri. She feels somewhat better after Phenergan and Toradol. We will have her follow with neurology.    I personally performed the services described in this documentation, which was scribed in my presence. The recorded information has been reviewed and considered.   Juliet Rude. Rubin Payor, MD 07/14/11 2010

## 2011-07-14 NOTE — ED Notes (Signed)
C/o left sided HA since Thanksgiving and pt states that pain is getting worse, denies N/V

## 2011-07-14 NOTE — ED Notes (Signed)
Pt denies needs at present time.  Denies relief from medication at this time.  Aware medication may need additional time to be effective.  Denies dizziness or vision difficulties at present.

## 2011-07-14 NOTE — ED Notes (Signed)
Dr. Rubin Payor aware of CT result

## 2011-08-26 ENCOUNTER — Emergency Department (HOSPITAL_COMMUNITY)
Admission: EM | Admit: 2011-08-26 | Discharge: 2011-08-27 | Discharge status: Against Medical Advice | Payer: Self-pay | Attending: Emergency Medicine | Admitting: Emergency Medicine

## 2011-08-26 DIAGNOSIS — R42 Dizziness and giddiness: Secondary | ICD-10-CM | POA: Insufficient documentation

## 2011-08-27 ENCOUNTER — Encounter (HOSPITAL_COMMUNITY): Payer: Self-pay | Admitting: *Deleted

## 2011-08-27 NOTE — ED Notes (Signed)
Pt reports she was at work tonight and got dizzy, pt reports staff took her b/p and it was very elevated

## 2011-09-09 ENCOUNTER — Emergency Department (HOSPITAL_COMMUNITY): Payer: Self-pay

## 2011-09-09 ENCOUNTER — Emergency Department (HOSPITAL_COMMUNITY)
Admission: EM | Admit: 2011-09-09 | Discharge: 2011-09-09 | Disposition: A | Discharge status: Home / Self Care | Payer: Self-pay | Attending: Emergency Medicine | Admitting: Emergency Medicine

## 2011-09-09 ENCOUNTER — Encounter (HOSPITAL_COMMUNITY): Payer: Self-pay

## 2011-09-09 DIAGNOSIS — J3489 Other specified disorders of nose and nasal sinuses: Secondary | ICD-10-CM | POA: Insufficient documentation

## 2011-09-09 DIAGNOSIS — R05 Cough: Secondary | ICD-10-CM | POA: Insufficient documentation

## 2011-09-09 DIAGNOSIS — F17200 Nicotine dependence, unspecified, uncomplicated: Secondary | ICD-10-CM | POA: Insufficient documentation

## 2011-09-09 DIAGNOSIS — R6883 Chills (without fever): Secondary | ICD-10-CM | POA: Insufficient documentation

## 2011-09-09 DIAGNOSIS — R062 Wheezing: Secondary | ICD-10-CM | POA: Insufficient documentation

## 2011-09-09 DIAGNOSIS — R070 Pain in throat: Secondary | ICD-10-CM | POA: Insufficient documentation

## 2011-09-09 DIAGNOSIS — R059 Cough, unspecified: Secondary | ICD-10-CM | POA: Insufficient documentation

## 2011-09-09 DIAGNOSIS — J209 Acute bronchitis, unspecified: Secondary | ICD-10-CM | POA: Insufficient documentation

## 2011-09-09 MED ORDER — ALBUTEROL SULFATE HFA 108 (90 BASE) MCG/ACT IN AERS
2.0000 | INHALATION_SPRAY | Freq: Once | RESPIRATORY_TRACT | Status: AC
  Administered 2011-09-09: 2 via RESPIRATORY_TRACT
  Filled 2011-09-09: qty 6.7

## 2011-09-09 MED ORDER — ALBUTEROL SULFATE (5 MG/ML) 0.5% IN NEBU
5.0000 mg | INHALATION_SOLUTION | Freq: Once | RESPIRATORY_TRACT | Status: AC
  Administered 2011-09-09: 5 mg via RESPIRATORY_TRACT
  Filled 2011-09-09: qty 1

## 2011-09-09 MED ORDER — IPRATROPIUM BROMIDE 0.02 % IN SOLN
0.5000 mg | Freq: Once | RESPIRATORY_TRACT | Status: AC
  Administered 2011-09-09: 0.5 mg via RESPIRATORY_TRACT

## 2011-09-09 MED ORDER — BENZONATATE 100 MG PO CAPS
200.0000 mg | ORAL_CAPSULE | Freq: Once | ORAL | Status: AC
  Administered 2011-09-09: 200 mg via ORAL
  Filled 2011-09-09: qty 2

## 2011-09-09 MED ORDER — BENZONATATE 200 MG PO CAPS: 200.0000 mg | ORAL_CAPSULE | Freq: Three times a day (TID) | ORAL | Status: AC | PRN

## 2011-09-09 MED ORDER — PREDNISONE 20 MG PO TABS: 60.0000 mg | ORAL_TABLET | Freq: Every day | ORAL | Status: AC

## 2011-09-09 MED ORDER — PREDNISONE 20 MG PO TABS
60.0000 mg | ORAL_TABLET | Freq: Every day | ORAL | Status: DC
  Administered 2011-09-09: 60 mg via ORAL
  Filled 2011-09-09: qty 3

## 2011-09-09 NOTE — ED Notes (Signed)
Complain of sore throat and congestion

## 2011-09-09 NOTE — ED Provider Notes (Signed)
Medical screening examination/treatment/procedure(s) were performed by non-physician practitioner and as supervising physician I was immediately available for consultation/collaboration. Carlas Vandyne Y.   Gavin Pound. Marrissa Dai, MD 09/09/11 1114

## 2011-09-09 NOTE — ED Provider Notes (Signed)
History     CSN: [id number]  Arrival date & time 09/09/11  0844   First MD Initiated Contact with Patient 09/09/11 0857      Chief Complaint  Patient presents with  . Sore Throat    (Consider location/radiation/quality/duration/timing/severity/associated sxs/prior treatment) Patient is a 46 y.o. female presenting with URI. The history is provided by the patient.  URI The primary symptoms include sore throat, cough and wheezing. Primary symptoms do not include fever, headaches, abdominal pain, nausea, arthralgias or rash. The current episode started 3 to 5 days ago. This is a new problem. The problem has been gradually worsening.  The sore throat began more than 2 days ago. The sore throat is moderate in intensity. The sore throat is not accompanied by drooling, hoarse voice or stridor.  The cough began 3 to 5 days ago. The cough is non-productive and hacking.  Wheezing began 2 days ago. Wheezing occurs intermittently. The wheezing has been unchanged since its onset. It is unknown what precipitated the wheezing. The patient's medical history does not include asthma or chronic lung disease.  Symptoms associated with the illness include chills and congestion. The illness is not associated with facial pain, sinus pressure or rhinorrhea. The following treatments were addressed: Acetaminophen was ineffective. A decongestant was ineffective.    History reviewed. No pertinent past medical history.  Past Surgical History  Procedure Date  . Abdominal hysterectomy   . Cholecystectomy   . Tubal ligation     No family history on file.  History  Substance Use Topics  . Smoking status: Current Some Day Smoker  . Smokeless tobacco: Not on file  . Alcohol Use: No    OB History    Grav Para Term Preterm Abortions TAB SAB Ect Mult Living                  Review of Systems  Constitutional: Positive for chills. Negative for fever.  HENT: Positive for congestion and sore throat. Negative  for hoarse voice, rhinorrhea, drooling, neck pain and sinus pressure.   Eyes: Negative.   Respiratory: Positive for cough and wheezing. Negative for chest tightness, shortness of breath and stridor.   Cardiovascular: Negative for chest pain.  Gastrointestinal: Negative for nausea and abdominal pain.  Genitourinary: Negative.   Musculoskeletal: Negative for joint swelling and arthralgias.  Skin: Negative.  Negative for rash and wound.  Neurological: Negative for dizziness, weakness, light-headedness, numbness and headaches.  Hematological: Negative.   Psychiatric/Behavioral: Negative.     Allergies  Review of patient's allergies indicates no known allergies.  Home Medications   Current Outpatient Rx  Name Route Sig Dispense Refill  . NYQUIL COLD & FLU PO Oral Take 2 capsules by mouth at bedtime as needed. Cold Symptoms    . BENZONATATE 200 MG PO CAPS Oral Take 1 capsule (200 mg total) by mouth 3 (three) times daily as needed for cough. 20 capsule 0    BP 121/76  Pulse 82  Temp(Src) 98.6 F (37 C) (Oral)  Resp 20  Ht 5\' 6"  (1.676 m)  Wt 225 lb (102.059 kg)  BMI 36.32 kg/m2  SpO2 96%  Physical Exam  Nursing note and vitals reviewed. Constitutional: She is oriented to person, place, and time. She appears well-developed and well-nourished.  HENT:  Head: Normocephalic and atraumatic.  Mouth/Throat: Uvula is midline, oropharynx is clear and moist and mucous membranes are normal.  Eyes: Conjunctivae are normal.  Neck: Normal range of motion. Neck supple.  Cardiovascular: Normal  rate, regular rhythm, normal heart sounds and intact distal pulses.   Pulmonary/Chest: Effort normal. She has wheezes.       Frequent non productive cough.  Abdominal: Soft. Bowel sounds are normal. There is no tenderness.  Musculoskeletal: Normal range of motion. She exhibits no edema.  Lymphadenopathy:    She has no cervical adenopathy.  Neurological: She is alert and oriented to person, place, and  time.  Skin: Skin is warm and dry.  Psychiatric: She has a normal mood and affect.    ED Course  Procedures (including critical care time)  Labs Reviewed - No data to display Dg Chest 2 View  09/09/2011  *RADIOLOGY REPORT*  Clinical Data: Cough, congestion and sore throat.  CHEST - 2 VIEW  Comparison: Chest x-ray 05/21/2008.  Findings: Lung volumes are normal.  No consolidative airspace disease.  No pleural effusions.  No pneumothorax.  No pulmonary nodule or mass noted.  Pulmonary vasculature and the cardiomediastinal silhouette are within normal limits.   Surgical clips projecting over the upper abdomen, possibly from prior cholecystectomy.  IMPRESSION: 1. No radiographic evidence of acute cardiopulmonary disease.  Original Report Authenticated By: Florencia Reasons, M.D.     1. Bronchitis with bronchospasm       MDM  Patient prescribed Tessalon Perles, five-day prednisone pulse dose and given albuterol MDI for wheezing.        Candis Musa, PA 09/09/11 1113

## 2011-09-09 NOTE — Discharge Instructions (Signed)

## 2011-09-25 ENCOUNTER — Emergency Department (HOSPITAL_COMMUNITY): Payer: Self-pay

## 2011-09-25 ENCOUNTER — Emergency Department (HOSPITAL_COMMUNITY)
Admission: EM | Admit: 2011-09-25 | Discharge: 2011-09-25 | Disposition: A | Discharge status: Home / Self Care | Payer: Self-pay | Attending: Emergency Medicine | Admitting: Emergency Medicine

## 2011-09-25 ENCOUNTER — Encounter (HOSPITAL_COMMUNITY): Payer: Self-pay

## 2011-09-25 DIAGNOSIS — R059 Cough, unspecified: Secondary | ICD-10-CM | POA: Insufficient documentation

## 2011-09-25 DIAGNOSIS — J40 Bronchitis, not specified as acute or chronic: Secondary | ICD-10-CM

## 2011-09-25 DIAGNOSIS — R197 Diarrhea, unspecified: Secondary | ICD-10-CM | POA: Insufficient documentation

## 2011-09-25 DIAGNOSIS — K529 Noninfective gastroenteritis and colitis, unspecified: Secondary | ICD-10-CM

## 2011-09-25 DIAGNOSIS — R112 Nausea with vomiting, unspecified: Secondary | ICD-10-CM | POA: Insufficient documentation

## 2011-09-25 DIAGNOSIS — J3489 Other specified disorders of nose and nasal sinuses: Secondary | ICD-10-CM | POA: Insufficient documentation

## 2011-09-25 DIAGNOSIS — K5289 Other specified noninfective gastroenteritis and colitis: Secondary | ICD-10-CM | POA: Insufficient documentation

## 2011-09-25 DIAGNOSIS — F17200 Nicotine dependence, unspecified, uncomplicated: Secondary | ICD-10-CM | POA: Insufficient documentation

## 2011-09-25 DIAGNOSIS — R05 Cough: Secondary | ICD-10-CM | POA: Insufficient documentation

## 2011-09-25 LAB — BASIC METABOLIC PANEL
BUN: 14 mg/dL (ref 6–23)
CO2: 27 mEq/L (ref 19–32)
Calcium: 9.9 mg/dL (ref 8.4–10.5)
Chloride: 104 mEq/L (ref 96–112)
Creatinine, Ser: 0.65 mg/dL (ref 0.50–1.10)

## 2011-09-25 LAB — CBC
HCT: 38.3 % (ref 36.0–46.0)
MCH: 28.2 pg (ref 26.0–34.0)
MCV: 84.4 fL (ref 78.0–100.0)
Platelets: 277 10*3/uL (ref 150–400)
RBC: 4.54 MIL/uL (ref 3.87–5.11)
RDW: 14.1 % (ref 11.5–15.5)

## 2011-09-25 MED ORDER — ONDANSETRON HCL 8 MG PO TABS: 8.0000 mg | ORAL_TABLET | ORAL | Status: AC | PRN

## 2011-09-25 MED ORDER — DIPHENOXYLATE-ATROPINE 2.5-0.025 MG PO TABS: 1.0000 | ORAL_TABLET | Freq: Four times a day (QID) | ORAL | Status: AC | PRN

## 2011-09-25 MED ORDER — HYDROCOD POLST-CHLORPHEN POLST 10-8 MG/5ML PO LQCR
5.0000 mL | Freq: Once | ORAL | Status: AC
  Administered 2011-09-25: 5 mL via ORAL
  Filled 2011-09-25: qty 5

## 2011-09-25 MED ORDER — AZITHROMYCIN 250 MG PO TABS: 250.0000 mg | ORAL_TABLET | Freq: Every day | ORAL | Status: AC

## 2011-09-25 MED ORDER — PANTOPRAZOLE SODIUM 40 MG IV SOLR
40.0000 mg | Freq: Once | INTRAVENOUS | Status: AC
  Administered 2011-09-25: 40 mg via INTRAVENOUS
  Filled 2011-09-25: qty 40

## 2011-09-25 MED ORDER — ONDANSETRON HCL 4 MG/2ML IJ SOLN
4.0000 mg | Freq: Once | INTRAMUSCULAR | Status: AC
  Administered 2011-09-25: 4 mg via INTRAVENOUS
  Filled 2011-09-25: qty 2

## 2011-09-25 MED ORDER — SODIUM CHLORIDE 0.9 % IV BOLUS (SEPSIS)
1000.0000 mL | Freq: Once | INTRAVENOUS | Status: AC
  Administered 2011-09-25: 1000 mL via INTRAVENOUS

## 2011-09-25 MED ORDER — ALBUTEROL SULFATE HFA 108 (90 BASE) MCG/ACT IN AERS: 1.0000 | INHALATION_SPRAY | Freq: Four times a day (QID) | RESPIRATORY_TRACT | Status: DC | PRN

## 2011-09-25 MED ORDER — ALBUTEROL SULFATE (5 MG/ML) 0.5% IN NEBU
5.0000 mg | INHALATION_SOLUTION | Freq: Once | RESPIRATORY_TRACT | Status: AC
  Administered 2011-09-25: 5 mg via RESPIRATORY_TRACT
  Filled 2011-09-25: qty 1

## 2011-09-25 MED ORDER — IPRATROPIUM BROMIDE 0.02 % IN SOLN
0.5000 mg | Freq: Once | RESPIRATORY_TRACT | Status: AC
  Administered 2011-09-25: 0.5 mg via RESPIRATORY_TRACT
  Filled 2011-09-25: qty 2.5

## 2011-09-25 MED ORDER — SODIUM CHLORIDE 0.9 % IV BOLUS (SEPSIS): 1000.0000 mL | Freq: Once | INTRAVENOUS | Status: DC

## 2011-09-25 MED ORDER — HYDROCOD POLST-CHLORPHEN POLST 10-8 MG/5ML PO LQCR: 5.0000 mL | Freq: Two times a day (BID) | ORAL | Status: DC

## 2011-09-25 NOTE — Discharge Instructions (Signed)
X-ray shows no pneumonia. Blood work was good.  Prescription for antibiotic, cough syrup, inhaler, medications for nausea and diarrhea. Increase fluids. Note for work given through Monday

## 2011-09-25 NOTE — ED Provider Notes (Signed)
This chart was scribed for Donnetta Hutching, MD by Williemae Natter. The patient was seen in room [facility] at 1:09 PM.  CSN: [id number]  Arrival date & time 09/25/11  1155   First MD Initiated Contact with Patient 09/25/11 1259      Chief Complaint  Patient presents with  . Emesis  . Diarrhea  . Cough    (Consider location/radiation/quality/duration/timing/severity/associated sxs/prior treatment) HPI Barbara Snow is a 46 y.o. female who presents to the Emergency Department complaining of a constant acute onset moderate chest cold for 3 weeks. Pt has associated vomiting and diarrhea that started last night. Pt has a productive cough. No hx of diabetes or htn. Pt was seen in ED for similar symptoms a few weeks ago. 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  PCP- Dr. Freida Busman   History reviewed. No pertinent past medical history.  Past Surgical History  Procedure Date  . Abdominal hysterectomy   . Cholecystectomy   . Tubal ligation     No family history on file.  History  Substance Use Topics  . Smoking status: Current Some Day Smoker  . Smokeless tobacco: Not on file  . Alcohol Use: No    OB History    Grav Para Term Preterm Abortions TAB SAB Ect Mult Living                  Review of Systems  Constitutional: Negative for fever.  HENT: Positive for congestion.   Respiratory: Positive for cough.   Gastrointestinal: Positive for nausea, vomiting and diarrhea.  Psychiatric/Behavioral: Negative for behavioral problems and confusion.  All other systems reviewed and are negative.    Allergies  Review of patient's allergies indicates no known allergies.  Home Medications   Current Outpatient Rx  Name Route Sig Dispense Refill  . NYQUIL COLD & FLU PO Oral Take 2 capsules by mouth at bedtime as needed. Cold Symptoms    . GUAIFENESIN ER 600 MG PO TB12 Oral Take 1,200 mg by mouth every 6 (six) hours as needed. For congestion and cough      BP 183/91   Pulse 89  Temp(Src) 98.5 F (36.9 C) (Oral)  Resp 20  Ht 5\' 6"  (1.676 m)  Wt 230 lb (104.327 kg)  BMI 37.12 kg/m2  SpO2 98%  Physical Exam  Nursing note and vitals reviewed. Constitutional: She is oriented to person, place, and time. She appears well-developed and well-nourished.       Coughing throughout exam.  HENT:  Head: Normocephalic and atraumatic.  Eyes: EOM are normal. Pupils are equal, round, and reactive to light.  Neck: Normal range of motion. Neck supple.  Cardiovascular: Normal rate.   Pulmonary/Chest: Effort normal. No respiratory distress. She has rhonchi.       Minimal rhonchi at bases of lungs  Abdominal: There is no tenderness.  Musculoskeletal: Normal range of motion.  Neurological: She is alert and oriented to person, place, and time.  Skin: Skin is warm and dry.  Psychiatric: She has a normal mood and affect. Her behavior is normal.    ED Course  Procedures (including critical care time) DIAGNOSTIC STUDIES: Oxygen Saturation is 98% on room air, normal by my interpretation.    COORDINATION OF CARE:  Medications  guaiFENesin (MUCINEX) 600 MG 12 hr tablet (not administered)  sodium chloride 0.9 % bolus 1,000 mL (not administered)  sodium chloride 0.9 % bolus 1,000 mL (not administered)  ondansetron (ZOFRAN) injection 4 mg (4 mg Intravenous  Given 09/25/11 1236)    Ordering IV fluids, cough medicine, x-ray, Zofran, Protonix, and breathing treatment. Labs Reviewed  BASIC METABOLIC PANEL - Abnormal; Notable for the following:    Glucose, Bld 108 (*)    All other components within normal limits  CBC  URINALYSIS, ROUTINE W REFLEX MICROSCOPIC  Dg Chest 2 View  09/25/2011  *RADIOLOGY REPORT*  Clinical Data: chest and cold for 3 weeks  CHEST - 2 VIEW  Comparison: 09/09/2011  Findings: The heart size and mediastinal contours are within normal limits.  Both lungs are clear.  The visualized skeletal structures are unremarkable.  IMPRESSION: Negative exam.   Original Report Authenticated By: Rosealee Albee, M.D.   No results found.   No diagnosis found.    MDM  Patient has had cough for 3 weeks. Chest x-ray negative. Additionally complains of n,v,d.  Will hydrate patient and check labs and chest x-ray 1430: Recheck prior to discharge. Patient feeling much better. Nontoxic. Well hydrated. I personally performed the services described in this documentation, which was scribed in my presence. The recorded information has been reviewed and considered.         Donnetta Hutching, MD 09/25/11 1447

## 2011-09-25 NOTE — ED Notes (Signed)
C/o emesis, diarrhea and coughing since last pm. 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

## 2013-09-16 ENCOUNTER — Emergency Department (HOSPITAL_COMMUNITY): Payer: BC Managed Care – PPO

## 2013-09-16 ENCOUNTER — Emergency Department (HOSPITAL_COMMUNITY)
Admission: EM | Admit: 2013-09-16 | Discharge: 2013-09-16 | Disposition: A | Discharge status: Home / Self Care | Payer: BC Managed Care – PPO | Attending: Emergency Medicine | Admitting: Emergency Medicine

## 2013-09-16 ENCOUNTER — Encounter (HOSPITAL_COMMUNITY): Payer: Self-pay | Admitting: Emergency Medicine

## 2013-09-16 DIAGNOSIS — S93402A Sprain of unspecified ligament of left ankle, initial encounter: Secondary | ICD-10-CM

## 2013-09-16 DIAGNOSIS — Y9301 Activity, walking, marching and hiking: Secondary | ICD-10-CM | POA: Insufficient documentation

## 2013-09-16 DIAGNOSIS — I10 Essential (primary) hypertension: Secondary | ICD-10-CM | POA: Insufficient documentation

## 2013-09-16 DIAGNOSIS — S93409A Sprain of unspecified ligament of unspecified ankle, initial encounter: Secondary | ICD-10-CM | POA: Insufficient documentation

## 2013-09-16 DIAGNOSIS — Y929 Unspecified place or not applicable: Secondary | ICD-10-CM | POA: Insufficient documentation

## 2013-09-16 DIAGNOSIS — F17200 Nicotine dependence, unspecified, uncomplicated: Secondary | ICD-10-CM | POA: Insufficient documentation

## 2013-09-16 DIAGNOSIS — X500XXA Overexertion from strenuous movement or load, initial encounter: Secondary | ICD-10-CM | POA: Insufficient documentation

## 2013-09-16 HISTORY — DX: Essential (primary) hypertension: I10

## 2013-09-16 MED ORDER — IBUPROFEN 600 MG PO TABS: 600.0000 mg | ORAL_TABLET | Freq: Four times a day (QID) | ORAL | Status: DC | PRN

## 2013-09-16 MED ORDER — HYDROCODONE-ACETAMINOPHEN 5-325 MG PO TABS: 1.0000 | ORAL_TABLET | ORAL | Status: DC | PRN

## 2013-09-16 MED ORDER — HYDROCODONE-ACETAMINOPHEN 5-325 MG PO TABS
1.0000 | ORAL_TABLET | Freq: Once | ORAL | Status: AC
  Administered 2013-09-16: 1 via ORAL
  Filled 2013-09-16: qty 1

## 2013-09-16 NOTE — Discharge Instructions (Signed)
Ankle Sprain °An ankle sprain is an injury to the strong, fibrous tissues (ligaments) that hold the bones of your ankle joint together.  °CAUSES °An ankle sprain is usually caused by a fall or by twisting your ankle. Ankle sprains most commonly occur when you step on the outer edge of your foot, and your ankle turns inward. People who participate in sports are more prone to these types of injuries.  °SYMPTOMS  °· Pain in your ankle. The pain may be present at rest or only when you are trying to stand or walk. °· Swelling. °· Bruising. Bruising may develop immediately or within 1 to 2 days after your injury. °· Difficulty standing or walking, particularly when turning corners or changing directions. °DIAGNOSIS  °Your caregiver will ask you details about your injury and perform a physical exam of your ankle to determine if you have an ankle sprain. During the physical exam, your caregiver will press on and apply pressure to specific areas of your foot and ankle. Your caregiver will try to move your ankle in certain ways. An X-ray exam may be done to be sure a bone was not broken or a ligament did not separate from one of the bones in your ankle (avulsion fracture).  °TREATMENT  °Certain types of braces can help stabilize your ankle. Your caregiver can make a recommendation for this. Your caregiver may recommend the use of medicine for pain. If your sprain is severe, your caregiver may refer you to a surgeon who helps to restore function to parts of your skeletal system (orthopedist) or a physical therapist. °HOME CARE INSTRUCTIONS  °· Apply ice to your injury for 1 2 days or as directed by your caregiver. Applying ice helps to reduce inflammation and pain. °· Put ice in a plastic bag. °· Place a towel between your skin and the bag. °· Leave the ice on for 15-20 minutes at a time, every 2 hours while you are awake. °· Only take over-the-counter or prescription medicines for pain, discomfort, or fever as directed by  your caregiver. °· Elevate your injured ankle above the level of your heart as much as possible for 2 3 days. °· If your caregiver recommends crutches, use them as instructed. Gradually put weight on the affected ankle. Continue to use crutches or a cane until you can walk without feeling pain in your ankle. °· If you have a plaster splint, wear the splint as directed by your caregiver. Do not rest it on anything harder than a pillow for the first 24 hours. Do not put weight on it. Do not get it wet. You may take it off to take a shower or bath. °· You may have been given an elastic bandage to wear around your ankle to provide support. If the elastic bandage is too tight (you have numbness or tingling in your foot or your foot becomes cold and blue), adjust the bandage to make it comfortable. °· If you have an air splint, you may blow more air into it or let air out to make it more comfortable. You may take your splint off at night and before taking a shower or bath. Wiggle your toes in the splint several times per day to decrease swelling. °SEEK MEDICAL CARE IF:  °· You have rapidly increasing bruising or swelling. °· Your toes feel extremely cold or you lose feeling in your foot. °· Your pain is not relieved with medicine. °SEEK IMMEDIATE MEDICAL CARE IF: °· Your toes are numb   or blue.  You have severe pain that is increasing. MAKE SURE YOU:   Understand these instructions.  Will watch your condition.  Will get help right away if you are not doing well or get worse. Document Released: 06/15/2005 Document Revised: 03/09/2012 Document Reviewed: 06/27/2011 [facility] Patient Information 2014 [place], [place].    Wear the ASO and use crutches to avoid weight bearing.  Use ice and elevation as much as possible for the next several days to help reduce the swelling.  Take the medications prescribed.  You may take the hydrocodone prescribed for pain relief.  This will make you drowsy - do not drive within 4  hours of taking this medication.  Use the ibuprofen also for inflammation.  Call your doctor for a recheck of your injury in 1 week.  You may benefit from physical therapy of your ankle if it is not getting better over the next week.

## 2013-09-16 NOTE — ED Notes (Signed)
nad noted prior to dc. Dc instructions reviewed and explained. Voiced understanding. nad noted prior to dc.

## 2013-09-16 NOTE — ED Notes (Signed)
Left ankle pain after twisting ankle last pm while walking. Slight swelling noted.

## 2013-09-19 NOTE — ED Provider Notes (Signed)
CSN: [id number]     Arrival date & time 09/16/13  1224 History   First MD Initiated Contact with Patient 09/16/13 1344     Chief Complaint  Patient presents with  . Ankle Pain     (Consider location/radiation/quality/duration/timing/severity/associated sxs/prior Treatment) HPI Comments: Barbara Snow is a 48 y.o. female   presenting with left ankle pain and swelling which occurred suddenly when the patient was walking and accidentally inverted the left ankle last night.  Pain is aching, constant and worse with palpation, movement and weight bearing.  The patient was able to weight bear immediately after the event.  There is no radiation of pain and the patient denies numbness distal to the injury site.  The patients treatment prior to arrival included ice and elevation, tylenol.   The history is provided by the patient.    Past Medical History  Diagnosis Date  . Hypertension    Past Surgical History  Procedure Laterality Date  . Abdominal hysterectomy    . Cholecystectomy    . Tubal ligation     No family history on file. History  Substance Use Topics  . Smoking status: Current Some Day Smoker  . Smokeless tobacco: Not on file  . Alcohol Use: No   OB History   Grav Para Term Preterm Abortions TAB SAB Ect Mult Living                 Review of Systems  Musculoskeletal: Positive for arthralgias and joint swelling.  Skin: Negative for wound.  Neurological: Negative for weakness and numbness.      Allergies  Review of patient's allergies indicates no known allergies.  Home Medications   Current Outpatient Rx  Name  Route  Sig  Dispense  Refill  . EXPIRED: albuterol (PROVENTIL HFA;VENTOLIN HFA) 108 (90 BASE) MCG/ACT inhaler   Inhalation   Inhale 1-2 puffs into the lungs every 6 (six) hours as needed for wheezing.   1 Inhaler   0   . chlorpheniramine-HYDROcodone (TUSSIONEX PENNKINETIC ER) 10-8 MG/5ML LQCR   Oral   Take 5 mLs by mouth every 12 (twelve) hours.  120 mL   0   . DM-Doxylamine-Acetaminophen (NYQUIL COLD & FLU PO)   Oral   Take 2 capsules by mouth at bedtime as needed. Cold Symptoms         . guaiFENesin (MUCINEX) 600 MG 12 hr tablet   Oral   Take 1,200 mg by mouth every 6 (six) hours as needed. For congestion and cough         . HYDROcodone-acetaminophen (NORCO/VICODIN) 5-325 MG per tablet   Oral   Take 1 tablet by mouth every 4 (four) hours as needed for moderate pain.   15 tablet   0   . ibuprofen (ADVIL,MOTRIN) 600 MG tablet   Oral   Take 1 tablet (600 mg total) by mouth every 6 (six) hours as needed.   30 tablet   0    BP 138/73  Pulse 73  Temp(Src) 98.4 F (36.9 C)  Resp 16  SpO2 100% Physical Exam  Nursing note and vitals reviewed. Constitutional: She appears well-developed and well-nourished.  HENT:  Head: Normocephalic.  Cardiovascular: Normal rate and intact distal pulses.  Exam reveals no decreased pulses.   Pulses:      Dorsalis pedis pulses are 2+ on the right side, and 2+ on the left side.       Posterior tibial pulses are 2+ on the right side, and 2+  on the left side.  Musculoskeletal: She exhibits edema and tenderness.       Left ankle: She exhibits decreased range of motion and swelling. She exhibits no ecchymosis, no deformity and normal pulse. Tenderness. Lateral malleolus tenderness found. No head of 5th metatarsal and no proximal fibula tenderness found. Achilles tendon normal.  Neurological: She is alert. No sensory deficit.  Skin: Skin is warm, dry and intact.    ED Course  Procedures (including critical care time) Labs Review Labs Reviewed - No data to display Imaging Review No results found.   EKG Interpretation None      MDM   Final diagnoses:  Left ankle sprain    Patients labs and/or radiological studies were viewed and considered during the medical decision making and disposition process. Pt prescribed hydrocodone,  Encouraged RICE, aso, crutches supplied.  Referred  to pcp for recheck in 7-10 days if not improved.  Patients labs and/or radiological studies were viewed and considered during the medical decision making and disposition process.   Evalee Jefferson, PA-C 09/19/13 2358

## 2013-09-20 NOTE — ED Provider Notes (Signed)
Medical screening examination/treatment/procedure(s) were performed by non-physician practitioner and as supervising physician I was immediately available for consultation/collaboration.   EKG Interpretation None       Nat Christen, MD 09/20/13 1237

## 2013-10-16 ENCOUNTER — Emergency Department (HOSPITAL_COMMUNITY)
Admission: EM | Admit: 2013-10-16 | Discharge: 2013-10-16 | Disposition: A | Discharge status: Home / Self Care | Payer: BC Managed Care – PPO

## 2013-11-10 ENCOUNTER — Emergency Department (HOSPITAL_COMMUNITY)
Admission: EM | Admit: 2013-11-10 | Discharge: 2013-11-10 | Disposition: A | Discharge status: Home / Self Care | Payer: BC Managed Care – PPO | Attending: Emergency Medicine | Admitting: Emergency Medicine

## 2013-11-10 ENCOUNTER — Emergency Department (HOSPITAL_COMMUNITY): Payer: BC Managed Care – PPO

## 2013-11-10 ENCOUNTER — Encounter (HOSPITAL_COMMUNITY): Payer: Self-pay | Admitting: Emergency Medicine

## 2013-11-10 DIAGNOSIS — S39012A Strain of muscle, fascia and tendon of lower back, initial encounter: Secondary | ICD-10-CM

## 2013-11-10 DIAGNOSIS — Z90710 Acquired absence of both cervix and uterus: Secondary | ICD-10-CM | POA: Insufficient documentation

## 2013-11-10 DIAGNOSIS — I10 Essential (primary) hypertension: Secondary | ICD-10-CM | POA: Insufficient documentation

## 2013-11-10 DIAGNOSIS — S335XXA Sprain of ligaments of lumbar spine, initial encounter: Secondary | ICD-10-CM | POA: Insufficient documentation

## 2013-11-10 DIAGNOSIS — Z791 Long term (current) use of non-steroidal anti-inflammatories (NSAID): Secondary | ICD-10-CM | POA: Insufficient documentation

## 2013-11-10 DIAGNOSIS — F17200 Nicotine dependence, unspecified, uncomplicated: Secondary | ICD-10-CM | POA: Insufficient documentation

## 2013-11-10 DIAGNOSIS — X503XXA Overexertion from repetitive movements, initial encounter: Secondary | ICD-10-CM | POA: Insufficient documentation

## 2013-11-10 DIAGNOSIS — Z792 Long term (current) use of antibiotics: Secondary | ICD-10-CM | POA: Insufficient documentation

## 2013-11-10 DIAGNOSIS — Y9389 Activity, other specified: Secondary | ICD-10-CM | POA: Insufficient documentation

## 2013-11-10 DIAGNOSIS — X500XXA Overexertion from strenuous movement or load, initial encounter: Secondary | ICD-10-CM | POA: Insufficient documentation

## 2013-11-10 DIAGNOSIS — K5900 Constipation, unspecified: Secondary | ICD-10-CM | POA: Insufficient documentation

## 2013-11-10 DIAGNOSIS — Z9851 Tubal ligation status: Secondary | ICD-10-CM | POA: Insufficient documentation

## 2013-11-10 DIAGNOSIS — Y929 Unspecified place or not applicable: Secondary | ICD-10-CM | POA: Insufficient documentation

## 2013-11-10 DIAGNOSIS — Z87442 Personal history of urinary calculi: Secondary | ICD-10-CM | POA: Insufficient documentation

## 2013-11-10 HISTORY — DX: Calculus of kidney: N20.0

## 2013-11-10 LAB — URINALYSIS, ROUTINE W REFLEX MICROSCOPIC
Bilirubin Urine: NEGATIVE
Glucose, UA: NEGATIVE mg/dL
HGB URINE DIPSTICK: NEGATIVE
Ketones, ur: NEGATIVE mg/dL
Leukocytes, UA: NEGATIVE
NITRITE: NEGATIVE
Protein, ur: NEGATIVE mg/dL
UROBILINOGEN UA: 0.2 mg/dL (ref 0.0–1.0)
pH: 6.5 (ref 5.0–8.0)

## 2013-11-10 MED ORDER — ONDANSETRON HCL 4 MG PO TABS
4.0000 mg | ORAL_TABLET | Freq: Once | ORAL | Status: AC
  Administered 2013-11-10: 4 mg via ORAL
  Filled 2013-11-10: qty 1

## 2013-11-10 MED ORDER — MAGNESIUM HYDROXIDE 400 MG/5ML PO SUSP
30.0000 mL | Freq: Once | ORAL | Status: AC
  Administered 2013-11-10: 30 mL via ORAL
  Filled 2013-11-10: qty 30

## 2013-11-10 MED ORDER — HYDROCODONE-ACETAMINOPHEN 5-325 MG PO TABS
2.0000 | ORAL_TABLET | Freq: Once | ORAL | Status: AC
  Administered 2013-11-10: 2 via ORAL
  Filled 2013-11-10: qty 2

## 2013-11-10 MED ORDER — METHOCARBAMOL 500 MG PO TABS: 500.0000 mg | ORAL_TABLET | Freq: Three times a day (TID) | ORAL | Status: DC

## 2013-11-10 MED ORDER — HYDROCODONE-ACETAMINOPHEN 5-325 MG PO TABS: ORAL_TABLET | ORAL | Status: DC

## 2013-11-10 MED ORDER — DIAZEPAM 5 MG PO TABS
5.0000 mg | ORAL_TABLET | Freq: Once | ORAL | Status: AC
  Administered 2013-11-10: 5 mg via ORAL
  Filled 2013-11-10: qty 1

## 2013-11-10 NOTE — Care Management Note (Signed)
ED/CM noted patient did not have health insurance and/or PCP listed in the computer.  Patient was given the [place] resource handout with information on the clinics, food pantries, and the handout for new health insurance sign-up.  Patient expressed appreciation for information received. 

## 2013-11-10 NOTE — Discharge Instructions (Signed)
Your urine test is negative for acute problem. Your chest and abd xrays reveal increase stool and gas, but are otherwise non-acute. Please increase fiber in your diet. Please increase water. Please apply heat to your back and rest your back as much as possible. Please use robaxin three times daily for spasm, use tylenol or ibuprofen for mild pain. Use norco for more severe pain. Robaxin and Norco may cause drowsiness, use with caution. Muscle Strain A muscle strain is an injury that occurs when a muscle is stretched beyond its normal length. Usually a small number of muscle fibers are torn when this happens. Muscle strain is rated in degrees. First-degree strains have the least amount of muscle fiber tearing and pain. Second-degree and third-degree strains have increasingly more tearing and pain.  Usually, recovery from muscle strain takes 1 2 weeks. Complete healing takes 5 6 weeks.  CAUSES  Muscle strain happens when a sudden, violent force placed on a muscle stretches it too far. This may occur with lifting, sports, or a fall.  RISK FACTORS Muscle strain is especially common in athletes.  SIGNS AND SYMPTOMS At the site of the muscle strain, there may be:  Pain.  Bruising.  Swelling.  Difficulty using the muscle due to pain or lack of normal function. DIAGNOSIS  Your health care provider will perform a physical exam and ask about your medical history. TREATMENT  Often, the best treatment for a muscle strain is resting, icing, and applying cold compresses to the injured area.  HOME CARE INSTRUCTIONS   Use the PRICE method of treatment to promote muscle healing during the first 2 3 days after your injury. The PRICE method involves:  Protecting the muscle from being injured again.  Restricting your activity and resting the injured body part.  Icing your injury. To do this, put ice in a plastic bag. Place a towel between your skin and the bag. Then, apply the ice and leave it on from 15  20 minutes each hour. After the third day, switch to moist heat packs.  Apply compression to the injured area with a splint or elastic bandage. Be careful not to wrap it too tightly. This may interfere with blood circulation or increase swelling.  Elevate the injured body part above the level of your heart as often as you can.  Only take over-the-counter or prescription medicines for pain, discomfort, or fever as directed by your health care provider.  Warming up prior to exercise helps to prevent future muscle strains. SEEK MEDICAL CARE IF:   You have increasing pain or swelling in the injured area.  You have numbness, tingling, or a significant loss of strength in the injured area. MAKE SURE YOU:   Understand these instructions.  Will watch your condition.  Will get help right away if you are not doing well or get worse. Document Released: 06/15/2005 Document Revised: 04/05/2013 Document Reviewed: 01/12/2013 [facility] Patient Information 2014 [place], [place].  Fiber Content in Foods Drinking plenty of fluids and consuming foods high in fiber can help with constipation. See the list below for the fiber content of some common foods. Starches and Grains / Dietary Fiber (g)  Cheerios, 1 cup / 3 g  Kellogg's Corn Flakes, 1 cup / 0.7 g  Rice Krispies, 1  cup / 0.3 g  Quaker Oat Life Cereal,  cup / 2.1 g  Oatmeal, instant (cooked),  cup / 2 g  Kellogg's Frosted Mini Wheats, 1 cup / 5.1 g  Rice, brown, long-grain (  cooked), 1 cup / 3.5 g  Rice, white, long-grain (cooked), 1 cup / 0.6 g  Macaroni, cooked, enriched, 1 cup / 2.5 g Legumes / Dietary Fiber (g)  Beans, baked, canned, plain or vegetarian,  cup / 5.2 g  Beans, kidney, canned,  cup / 6.8 g  Beans, pinto, dried (cooked),  cup / 7.7 g  Beans, pinto, canned,  cup / 5.5 g Breads and Crackers / Dietary Fiber (g)  Graham crackers, plain or honey, 2 squares / 0.7 g  Saltine crackers, 3 squares / 0.3  g  Pretzels, plain, salted, 10 pieces / 1.8 g  Bread, whole-wheat, 1 slice / 1.9 g  Bread, white, 1 slice / 0.7 g  Bread, raisin, 1 slice / 1.2 g  Bagel, plain, 3 oz / 2 g  Tortilla, flour, 1 oz / 0.9 g  Tortilla, corn, 1 small / 1.5 g  Bun, hamburger or hotdog, 1 small / 0.9 g Fruits / Dietary Fiber (g)  Apple, raw with skin, 1 medium / 4.4 g  Applesauce, sweetened,  cup / 1.5 g  Banana,  medium / 1.5 g  Grapes, 10 grapes / 0.4 g  Orange, 1 small / 2.3 g  Raisin, 1.5 oz / 1.6 g  Melon, 1 cup / 1.4 g Vegetables / Dietary Fiber (g)  Green beans, canned,  cup / 1.3 g  Carrots (cooked),  cup / 2.3 g  Broccoli (cooked),  cup / 2.8 g  Peas, frozen (cooked),  cup / 4.4 g  Potatoes, mashed,  cup / 1.6 g  Lettuce, 1 cup / 0.5 g  Corn, canned,  cup / 1.6 g  Tomato,  cup / 1.1 g Document Released: 11/01/2006 Document Revised: 09/07/2011 Document Reviewed: 12/27/2006 [employer] Patient Information 2014 [place], [place].

## 2013-11-10 NOTE — ED Notes (Signed)
Left flank pain radiating to back x 1 month.  Seen PCP last week and given abx - reports nausea, denies vomiting/Gu sx.

## 2013-11-10 NOTE — ED Notes (Signed)
Pt states lt flank pain that radiates toward back.

## 2013-11-10 NOTE — ED Provider Notes (Signed)
CSN: [id number]     Arrival date & time 11/10/13  0734 History   First MD Initiated Contact with Patient 11/10/13 [phone]     Chief Complaint  Patient presents with  . Flank Pain     (Consider location/radiation/quality/duration/timing/severity/associated sxs/prior Treatment) HPI Comments: The patient is a 48 year old female who presents to the emergency department with the complaint of left flank pain that radiates into her back. This is been going off and on for approximately a month. The patient was seen at the health department approximately a week ago and she was told that there was" something in her urine". The patient was treated with an antibiotic. There was also blood work obtained, but the patient has not received the results of the blood work as of yet. It is of note that the patient works as a Automotive engineer, she does a lot of pushing, pulling, lifting, bending. The patient has not had any high fevers. There's been no vomiting. There has been some nausea reported. There's been no hematuria reported.  Patient is a 48 y.o. female presenting with flank pain. The history is provided by the patient.  Flank Pain Pertinent negatives include no abdominal pain, arthralgias, chest pain, coughing or neck pain.    Past Medical History  Diagnosis Date  . Hypertension   . Kidney stone    Past Surgical History  Procedure Laterality Date  . Abdominal hysterectomy    . Cholecystectomy    . Tubal ligation     No family history on file. History  Substance Use Topics  . Smoking status: Light Tobacco Smoker    Types: Cigarettes  . Smokeless tobacco: Not on file  . Alcohol Use: No   OB History   Grav Para Term Preterm Abortions TAB SAB Ect Mult Living                 Review of Systems  Constitutional: Negative for activity change.       All ROS Neg except as noted in HPI  HENT: Negative for nosebleeds.   Eyes: Negative for photophobia and discharge.  Respiratory:  Negative for cough, shortness of breath and wheezing.   Cardiovascular: Negative for chest pain and palpitations.  Gastrointestinal: Negative for abdominal pain and blood in stool.  Genitourinary: Positive for flank pain. Negative for dysuria, frequency and hematuria.  Musculoskeletal: Positive for back pain. Negative for arthralgias and neck pain.  Skin: Negative.   Neurological: Negative for dizziness, seizures and speech difficulty.  Psychiatric/Behavioral: Negative for hallucinations and confusion.      Allergies  Review of patient's allergies indicates no known allergies.  Home Medications   Prior to Admission medications   Medication Sig Start Date End Date Taking? Authorizing Provider  ciprofloxacin (CIPRO) 250 MG tablet Take 250 mg by mouth 2 (two) times daily.   Yes Historical Provider, MD  ibuprofen (ADVIL,MOTRIN) 200 MG tablet Take 600 mg by mouth every 6 (six) hours as needed for moderate pain.   Yes Historical Provider, MD  lisinopril-hydrochlorothiazide (PRINZIDE,ZESTORETIC) 20-12.5 MG per tablet Take 1 tablet by mouth daily.   Yes Historical Provider, MD  HYDROcodone-acetaminophen (NORCO/VICODIN) 5-325 MG per tablet 1 or 2 po q4h prn pain 11/10/13   Lenox Ahr, PA-C  methocarbamol (ROBAXIN) 500 MG tablet Take 1 tablet (500 mg total) by mouth 3 (three) times daily. 11/10/13   Lenox Ahr, PA-C   BP 159/81  Pulse 54  Temp(Src) 98.1 F (36.7 C) (Oral)  Resp 20  Ht 5\' 6"  (1.676 m)  Wt 252 lb (114.306 kg)  BMI 40.69 kg/m2  SpO2 100% Physical Exam  Nursing note and vitals reviewed. Constitutional: She is oriented to person, place, and time. She appears well-developed and well-nourished.  Non-toxic appearance.  HENT:  Head: Normocephalic.  Right Ear: Tympanic membrane and external ear normal.  Left Ear: Tympanic membrane and external ear normal.  Eyes: EOM and lids are normal. Pupils are equal, round, and reactive to light.  Neck: Normal range of motion. Neck  supple. Carotid bruit is not present.  Cardiovascular: Normal rate, regular rhythm, normal heart sounds, intact distal pulses and normal pulses.   Pulmonary/Chest: Breath sounds normal. No respiratory distress.  Abdominal: Soft. Bowel sounds are normal. There is no tenderness. There is no guarding.  Musculoskeletal: Normal range of motion.  There is pain to palpation of the left paraspinous muscles of the lumbar region. This extends to the left flank just below the left breast area. There no hot areas appreciated. There is pain with attempted range of motion, and some mild spasm at times. There is no palpable step off of the lumbar spine area.  Lymphadenopathy:       Head (right side): No submandibular adenopathy present.       Head (left side): No submandibular adenopathy present.    She has no cervical adenopathy.  Neurological: She is alert and oriented to person, place, and time. She has normal strength. No cranial nerve deficit or sensory deficit. She exhibits normal muscle tone. Coordination normal.  Gait is slow but steady. There is no foot drop.  Skin: Skin is warm and dry.  Psychiatric: She has a normal mood and affect. Her speech is normal.    ED Course  Procedures (including critical care time) Labs Review Labs Reviewed  URINALYSIS, ROUTINE W REFLEX MICROSCOPIC - Abnormal; Notable for the following:    Specific Gravity, Urine <1.005 (*)    All other components within normal limits    Imaging Review Dg Abd Acute W/chest  11/10/2013   CLINICAL DATA:  Intermittent left flank pain, history of renal stones, nausea  EXAM: ACUTE ABDOMEN SERIES (ABDOMEN 2 VIEW & CHEST 1 VIEW)  COMPARISON:  Chest radiograph - 09/25/2011; abdominal CT -08/24/2006  FINDINGS: Grossly unchanged cardiac silhouette and mediastinal contours. There is mild elevation of the right hemidiaphragm. No focal airspace opacities. No pleural effusion or pneumothorax. No evidence of edema.  Moderate colonic stool burden  without evidence of obstruction. No pneumoperitoneum, pneumatosis or portal venous gas.  Multiple phleboliths overlie the lower pelvis bilaterally. No definite abnormal opacities overlie the expected location of either renal fossa, ureter or the urinary bladder.  Post cholecystectomy. There is an additional surgical clip overlying the left lower abdomen, possibly a displaced cholecystectomy clips.  No acute osseus abnormalities.  IMPRESSION: 1.  No acute cardiopulmonary disease. 2. Moderate colonic stool burden without evidence of obstruction.   Electronically Signed   By: Sandi Mariscal M.D.   On: 11/10/2013 09:08     EKG Interpretation None      MDM The urine analysis is negative for any acute changes or problems. No evidence for infection or kidney stone. The patient has had a hysterectomy, making pregnancy an impossibility. X-ray of the abdomen and chest are negative for any acute changes. There is a moderate colonic stool burden present otherwise within normal limits.  Suspect the patient has a lumbar strain. Patient also has some increased stool burden. The plan at this time is  for the patient to have an increase in the fiber and fluids liquids of her diet. A prescription for Norco, Robaxin, given to the patient. The patient is excused from work duty, to return to work on may 19.    Final diagnoses:  Lumbar strain  Constipation    *I have reviewed nursing notes, vital signs, and all appropriate lab and imaging results for this patient.Lenox Ahr, PA-C 11/10/13 1022

## 2013-11-16 NOTE — ED Provider Notes (Signed)
Medical screening examination/treatment/procedure(s) were performed by non-physician practitioner and as supervising physician I was immediately available for consultation/collaboration.   EKG Interpretation None        Alfonzia Woolum, MD 11/16/13 0035 

## 2013-12-29 ENCOUNTER — Encounter (HOSPITAL_COMMUNITY): Payer: Self-pay | Admitting: Emergency Medicine

## 2013-12-29 ENCOUNTER — Emergency Department (HOSPITAL_COMMUNITY)
Admission: EM | Admit: 2013-12-29 | Discharge: 2013-12-29 | Disposition: A | Discharge status: Home / Self Care | Payer: BC Managed Care – PPO | Attending: Emergency Medicine | Admitting: Emergency Medicine

## 2013-12-29 ENCOUNTER — Emergency Department (HOSPITAL_COMMUNITY): Payer: BC Managed Care – PPO

## 2013-12-29 DIAGNOSIS — Z9089 Acquired absence of other organs: Secondary | ICD-10-CM | POA: Diagnosis not present

## 2013-12-29 DIAGNOSIS — Z791 Long term (current) use of non-steroidal anti-inflammatories (NSAID): Secondary | ICD-10-CM | POA: Diagnosis not present

## 2013-12-29 DIAGNOSIS — I10 Essential (primary) hypertension: Secondary | ICD-10-CM | POA: Diagnosis not present

## 2013-12-29 DIAGNOSIS — F17200 Nicotine dependence, unspecified, uncomplicated: Secondary | ICD-10-CM | POA: Diagnosis not present

## 2013-12-29 DIAGNOSIS — Z792 Long term (current) use of antibiotics: Secondary | ICD-10-CM | POA: Diagnosis not present

## 2013-12-29 DIAGNOSIS — Z90710 Acquired absence of both cervix and uterus: Secondary | ICD-10-CM | POA: Insufficient documentation

## 2013-12-29 DIAGNOSIS — Z79899 Other long term (current) drug therapy: Secondary | ICD-10-CM | POA: Diagnosis not present

## 2013-12-29 DIAGNOSIS — Z87442 Personal history of urinary calculi: Secondary | ICD-10-CM | POA: Diagnosis not present

## 2013-12-29 DIAGNOSIS — Z9851 Tubal ligation status: Secondary | ICD-10-CM | POA: Diagnosis not present

## 2013-12-29 DIAGNOSIS — R109 Unspecified abdominal pain: Secondary | ICD-10-CM | POA: Diagnosis not present

## 2013-12-29 LAB — CBC WITH DIFFERENTIAL/PLATELET
Basophils Absolute: 0 10*3/uL (ref 0.0–0.1)
Basophils Relative: 1 % (ref 0–1)
Eosinophils Absolute: 0.2 10*3/uL (ref 0.0–0.7)
Eosinophils Relative: 4 % (ref 0–5)
HCT: 35.7 % — ABNORMAL LOW (ref 36.0–46.0)
Hemoglobin: 12.9 g/dL (ref 12.0–15.0)
LYMPHS PCT: 43 % (ref 12–46)
Lymphs Abs: 2.8 10*3/uL (ref 0.7–4.0)
MCH: 30.9 pg (ref 26.0–34.0)
MCHC: 36.1 g/dL — ABNORMAL HIGH (ref 30.0–36.0)
MCV: 85.6 fL (ref 78.0–100.0)
MONO ABS: 0.6 10*3/uL (ref 0.1–1.0)
Monocytes Relative: 9 % (ref 3–12)
Neutro Abs: 2.9 10*3/uL (ref 1.7–7.7)
Neutrophils Relative %: 45 % (ref 43–77)
Platelets: 246 10*3/uL (ref 150–400)
RBC: 4.17 MIL/uL (ref 3.87–5.11)
RDW: 12.8 % (ref 11.5–15.5)
WBC: 6.6 10*3/uL (ref 4.0–10.5)

## 2013-12-29 LAB — URINALYSIS, ROUTINE W REFLEX MICROSCOPIC
Bilirubin Urine: NEGATIVE
Glucose, UA: NEGATIVE mg/dL
HGB URINE DIPSTICK: NEGATIVE
Ketones, ur: NEGATIVE mg/dL
Nitrite: NEGATIVE
PROTEIN: NEGATIVE mg/dL
Specific Gravity, Urine: >1.03 — ABNORMAL HIGH (ref 1.005–1.030)
Urobilinogen, UA: 0.2 mg/dL (ref 0.0–1.0)
pH: 5.5 (ref 5.0–8.0)

## 2013-12-29 LAB — BASIC METABOLIC PANEL
ANION GAP: 12 (ref 5–15)
BUN: 16 mg/dL (ref 6–23)
CALCIUM: 9.2 mg/dL (ref 8.4–10.5)
CHLORIDE: 100 meq/L (ref 96–112)
CO2: 26 meq/L (ref 19–32)
CREATININE: 0.74 mg/dL (ref 0.50–1.10)
GFR calc Af Amer: >90 mL/min (ref >90)
GFR calc non Af Amer: >90 mL/min (ref >90)
GLUCOSE: 113 mg/dL — AB (ref 70–99)
Potassium: 3.9 mEq/L (ref 3.7–5.3)
Sodium: 138 mEq/L (ref 137–147)

## 2013-12-29 LAB — URINE MICROSCOPIC-ADD ON

## 2013-12-29 MED ORDER — IOHEXOL 300 MG/ML  SOLN
100.0000 mL | Freq: Once | INTRAMUSCULAR | Status: AC | PRN
Start: 2013-12-29 — End: 2013-12-29
  Administered 2013-12-29: 100 mL via INTRAVENOUS

## 2013-12-29 MED ORDER — SODIUM CHLORIDE 0.9 % IV SOLN: 1000.0000 mL | INTRAVENOUS | Status: DC

## 2013-12-29 MED ORDER — CEPHALEXIN 500 MG PO CAPS: 500.0000 mg | ORAL_CAPSULE | Freq: Four times a day (QID) | ORAL | Status: DC

## 2013-12-29 MED ORDER — CEPHALEXIN 500 MG PO CAPS
500.0000 mg | ORAL_CAPSULE | Freq: Once | ORAL | Status: AC
  Administered 2013-12-29: 500 mg via ORAL
  Filled 2013-12-29: qty 1

## 2013-12-29 MED ORDER — HYDROMORPHONE HCL PF 1 MG/ML IJ SOLN
1.0000 mg | Freq: Once | INTRAMUSCULAR | Status: AC
  Administered 2013-12-29: 1 mg via INTRAVENOUS
  Filled 2013-12-29: qty 1

## 2013-12-29 MED ORDER — ONDANSETRON HCL 4 MG/2ML IJ SOLN
4.0000 mg | Freq: Once | INTRAMUSCULAR | Status: AC
  Administered 2013-12-29: 4 mg via INTRAVENOUS
  Filled 2013-12-29: qty 2

## 2013-12-29 MED ORDER — OXYCODONE-ACETAMINOPHEN 5-325 MG PO TABS: 1.0000 | ORAL_TABLET | ORAL | Status: DC | PRN

## 2013-12-29 MED ORDER — SODIUM CHLORIDE 0.9 % IV SOLN
1000.0000 mL | Freq: Once | INTRAVENOUS | Status: AC
  Administered 2013-12-29: 1000 mL via INTRAVENOUS

## 2013-12-29 MED ORDER — IOHEXOL 300 MG/ML  SOLN
50.0000 mL | Freq: Once | INTRAMUSCULAR | Status: AC | PRN
  Administered 2013-12-29: 50 mL via ORAL

## 2013-12-29 NOTE — ED Provider Notes (Signed)
BP 127/83  Pulse 60  Temp(Src) 97.6 F (36.4 C) (Oral)  Resp 12  Ht 5\' 6"  (1.676 m)  Wt 260 lb (117.935 kg)  BMI 41.99 kg/m2  SpO2 100% CT scan reviewed, no acute findings Plan from checkout was to treat for UTI if negative CT scan Pt stable She reports dizziness, but no focal weakness noted and she is ambulatory without ataxia  Sharyon Cable, MD 12/29/13 [phone]

## 2013-12-29 NOTE — Discharge Instructions (Signed)

## 2013-12-29 NOTE — ED Notes (Signed)
Pain in left side with nausea since Tuesday

## 2013-12-29 NOTE — ED Notes (Signed)
NAD noted at time of d/c instruction.  

## 2013-12-29 NOTE — ED Notes (Signed)
Pt c/o left flank pain with nausea since Tuesday. Pt denies any urinary symptoms.

## 2013-12-29 NOTE — ED Provider Notes (Signed)
CSN: [id number]     Arrival date & time 12/29/13  0507 History   First MD Initiated Contact with Patient 12/29/13 [phone]     Chief Complaint  Patient presents with  . Flank Pain      HPI Patient presents emergency Department ongoing left-sided abdominal and flank pain over the past 3-4 days.  Her pain is been constant.  She's had nausea and vomiting.  She denies diarrhea.  No urinary complaints.  She's had no documented fever.  Her pain is moderate in severity.  She states her symptoms seem to be worsening.  Her pain is uncontrolled at home with over-the-counter medications.  No chest pain or shortness breath.  No other complaints. Denies diarrhea    Past Medical History  Diagnosis Date  . Hypertension   . Kidney stone    Past Surgical History  Procedure Laterality Date  . Abdominal hysterectomy    . Cholecystectomy    . Tubal ligation     No family history on file. History  Substance Use Topics  . Smoking status: Light Tobacco Smoker    Types: Cigarettes  . Smokeless tobacco: Not on file  . Alcohol Use: No   OB History   Grav Para Term Preterm Abortions TAB SAB Ect Mult Living                 Review of Systems  All other systems reviewed and are negative.     Allergies  Review of patient's allergies indicates no known allergies.  Home Medications   Prior to Admission medications   Medication Sig Start Date End Date Taking? Authorizing Provider  ibuprofen (ADVIL,MOTRIN) 200 MG tablet Take 600 mg by mouth every 6 (six) hours as needed for moderate pain.   Yes Historical Provider, MD  lisinopril-hydrochlorothiazide (PRINZIDE,ZESTORETIC) 20-12.5 MG per tablet Take 1 tablet by mouth daily.   Yes Historical Provider, MD  ciprofloxacin (CIPRO) 250 MG tablet Take 250 mg by mouth 2 (two) times daily.    Historical Provider, MD  HYDROcodone-acetaminophen (NORCO/VICODIN) 5-325 MG per tablet 1 or 2 po q4h prn pain 11/10/13   Lenox Ahr, PA-C  methocarbamol (ROBAXIN) 500  MG tablet Take 1 tablet (500 mg total) by mouth 3 (three) times daily. 11/10/13   Lenox Ahr, PA-C   BP 176/90  Pulse 70  Resp 20  Ht 5\' 6"  (1.676 m)  Wt 260 lb (117.935 kg)  BMI 41.99 kg/m2  SpO2 100% Physical Exam  Nursing note and vitals reviewed. Constitutional: She is oriented to person, place, and time. She appears well-developed and well-nourished. No distress.  HENT:  Head: Normocephalic and atraumatic.  Eyes: EOM are normal.  Neck: Normal range of motion.  Cardiovascular: Normal rate, regular rhythm and normal heart sounds.   Pulmonary/Chest: Effort normal and breath sounds normal.  Abdominal: Soft. She exhibits no distension.  Left-sided abdominal tenderness.  No guarding or rebound.  Mild left flank tenderness.  Musculoskeletal: Normal range of motion.  Neurological: She is alert and oriented to person, place, and time.  Skin: Skin is warm and dry.  Psychiatric: She has a normal mood and affect. Judgment normal.    ED Course  Procedures (including critical care time) Labs Review Labs Reviewed  URINALYSIS, ROUTINE W REFLEX MICROSCOPIC - Abnormal; Notable for the following:    Specific Gravity, Urine >1.030 (*)    Leukocytes, UA SMALL (*)    All other components within normal limits  CBC WITH DIFFERENTIAL - Abnormal; Notable for  the following:    HCT 35.7 (*)    MCHC 36.1 (*)    All other components within normal limits  BASIC METABOLIC PANEL - Abnormal; Notable for the following:    Glucose, Bld 113 (*)    All other components within normal limits  URINE MICROSCOPIC-ADD ON - Abnormal; Notable for the following:    Squamous Epithelial / LPF MANY (*)    Bacteria, UA MANY (*)    All other components within normal limits  URINE CULTURE    Imaging Review No results found.   EKG Interpretation None      MDM   Final diagnoses:  None    Suspicious for pyelonephritis versus infected stone versus left-sided diverticulitis.  CT scan pending at this  time.  Care to Dr. Christy Gentles to followup on CT scan.  Urine cultures been obtained.     Hoy Morn, MD 12/29/13 [phone]

## 2013-12-31 LAB — URINE CULTURE

## 2014-02-06 ENCOUNTER — Encounter (HOSPITAL_COMMUNITY): Payer: Self-pay | Admitting: Emergency Medicine

## 2014-02-06 ENCOUNTER — Emergency Department (HOSPITAL_COMMUNITY): Payer: BC Managed Care – PPO

## 2014-02-06 ENCOUNTER — Emergency Department (HOSPITAL_COMMUNITY)
Admission: EM | Admit: 2014-02-06 | Discharge: 2014-02-06 | Disposition: A | Discharge status: Home / Self Care | Payer: BC Managed Care – PPO | Attending: Emergency Medicine | Admitting: Emergency Medicine

## 2014-02-06 DIAGNOSIS — J029 Acute pharyngitis, unspecified: Secondary | ICD-10-CM | POA: Insufficient documentation

## 2014-02-06 DIAGNOSIS — I10 Essential (primary) hypertension: Secondary | ICD-10-CM | POA: Insufficient documentation

## 2014-02-06 DIAGNOSIS — R059 Cough, unspecified: Secondary | ICD-10-CM | POA: Insufficient documentation

## 2014-02-06 DIAGNOSIS — Z87442 Personal history of urinary calculi: Secondary | ICD-10-CM | POA: Insufficient documentation

## 2014-02-06 DIAGNOSIS — J209 Acute bronchitis, unspecified: Secondary | ICD-10-CM

## 2014-02-06 DIAGNOSIS — Z79899 Other long term (current) drug therapy: Secondary | ICD-10-CM | POA: Insufficient documentation

## 2014-02-06 DIAGNOSIS — R05 Cough: Secondary | ICD-10-CM | POA: Insufficient documentation

## 2014-02-06 DIAGNOSIS — Z87891 Personal history of nicotine dependence: Secondary | ICD-10-CM | POA: Insufficient documentation

## 2014-02-06 MED ORDER — IPRATROPIUM-ALBUTEROL 0.5-2.5 (3) MG/3ML IN SOLN
3.0000 mL | Freq: Once | RESPIRATORY_TRACT | Status: AC
  Administered 2014-02-06: 3 mL via RESPIRATORY_TRACT
  Filled 2014-02-06: qty 3

## 2014-02-06 MED ORDER — ALBUTEROL SULFATE (2.5 MG/3ML) 0.083% IN NEBU
2.5000 mg | INHALATION_SOLUTION | Freq: Once | RESPIRATORY_TRACT | Status: AC
  Administered 2014-02-06: 2.5 mg via RESPIRATORY_TRACT
  Filled 2014-02-06: qty 3

## 2014-02-06 MED ORDER — AZITHROMYCIN 250 MG PO TABS: 250.0000 mg | ORAL_TABLET | Freq: Every day | ORAL | Status: DC

## 2014-02-06 MED ORDER — PROMETHAZINE-CODEINE 6.25-10 MG/5ML PO SYRP: 5.0000 mL | ORAL_SOLUTION | ORAL | Status: DC | PRN

## 2014-02-06 MED ORDER — PROMETHAZINE-CODEINE 6.25-10 MG/5ML PO SYRP
5.0000 mL | ORAL_SOLUTION | Freq: Once | ORAL | Status: AC
  Administered 2014-02-06: 5 mL via ORAL
  Filled 2014-02-06: qty 5

## 2014-02-06 MED ORDER — HYDROCOD POLST-CHLORPHEN POLST 10-8 MG/5ML PO LQCR
5.0000 mL | Freq: Once | ORAL | Status: AC
  Administered 2014-02-06: 5 mL via ORAL
  Filled 2014-02-06: qty 5

## 2014-02-06 MED ORDER — BENZONATATE 100 MG PO CAPS: 200.0000 mg | ORAL_CAPSULE | Freq: Three times a day (TID) | ORAL | Status: DC | PRN

## 2014-02-06 MED ORDER — AZITHROMYCIN 250 MG PO TABS
500.0000 mg | ORAL_TABLET | Freq: Once | ORAL | Status: AC
  Administered 2014-02-06: 500 mg via ORAL
  Filled 2014-02-06: qty 2

## 2014-02-06 MED ORDER — ALBUTEROL SULFATE HFA 108 (90 BASE) MCG/ACT IN AERS
2.0000 | INHALATION_SPRAY | Freq: Once | RESPIRATORY_TRACT | Status: AC
  Administered 2014-02-06: 2 via RESPIRATORY_TRACT
  Filled 2014-02-06: qty 6.7

## 2014-02-06 MED ORDER — BENZONATATE 100 MG PO CAPS
200.0000 mg | ORAL_CAPSULE | Freq: Once | ORAL | Status: AC
  Administered 2014-02-06: 200 mg via ORAL
  Filled 2014-02-06: qty 2

## 2014-02-06 NOTE — ED Notes (Signed)
Productive cough x 3 days with chest and throat soreness.

## 2014-02-06 NOTE — Discharge Instructions (Signed)
Acute Bronchitis Bronchitis is inflammation of the airways that extend from the windpipe into the lungs (bronchi). The inflammation often causes mucus to develop. This leads to a cough, which is the most common symptom of bronchitis.  In acute bronchitis, the condition usually develops suddenly and goes away over time, usually in a couple weeks. Smoking, allergies, and asthma can make bronchitis worse. Repeated episodes of bronchitis may cause further lung problems.  CAUSES Acute bronchitis is most often caused by the same virus that causes a cold. The virus can spread from person to person (contagious) through coughing, sneezing, and touching contaminated objects. SIGNS AND SYMPTOMS   Cough.   Fever.   Coughing up mucus.   Body aches.   Chest congestion.   Chills.   Shortness of breath.   Sore throat.  DIAGNOSIS  Acute bronchitis is usually diagnosed through a physical exam. Your health care provider will also ask you questions about your medical history. Tests, such as chest X-rays, are sometimes done to rule out other conditions.  TREATMENT  Acute bronchitis usually goes away in a couple weeks. Oftentimes, no medical treatment is necessary. Medicines are sometimes given for relief of fever or cough. Antibiotic medicines are usually not needed but may be prescribed in certain situations. In some cases, an inhaler may be recommended to help reduce shortness of breath and control the cough. A cool mist vaporizer may also be used to help thin bronchial secretions and make it easier to clear the chest.  HOME CARE INSTRUCTIONS  Get plenty of rest.   Drink enough fluids to keep your urine clear or pale yellow (unless you have a medical condition that requires fluid restriction). Increasing fluids may help thin your respiratory secretions (sputum) and reduce chest congestion, and it will prevent dehydration.   Take medicines only as directed by your health care provider.  If  you were prescribed an antibiotic medicine, finish it all even if you start to feel better.  Avoid smoking and secondhand smoke. Exposure to cigarette smoke or irritating chemicals will make bronchitis worse. If you are a smoker, consider using nicotine gum or skin patches to help control withdrawal symptoms. Quitting smoking will help your lungs heal faster.   Reduce the chances of another bout of acute bronchitis by washing your hands frequently, avoiding people with cold symptoms, and trying not to touch your hands to your mouth, nose, or eyes.   Keep all follow-up visits as directed by your health care provider.  SEEK MEDICAL CARE IF: Your symptoms do not improve after 1 week of treatment.  SEEK IMMEDIATE MEDICAL CARE IF:  You develop an increased fever or chills.   You have chest pain.   You have severe shortness of breath.  You have bloody sputum.   You develop dehydration.  You faint or repeatedly feel like you are going to pass out.  You develop repeated vomiting.  You develop a severe headache. MAKE SURE YOU:   Understand these instructions.  Will watch your condition.  Will get help right away if you are not doing well or get worse. Document Released: 07/23/2004 Document Revised: 10/30/2013 Document Reviewed: 12/06/2012 [facility] Patient Information 2015 [place], [place]. This information is not intended to replace advice given to you by your health care provider. Make sure you discuss any questions you have with your health care provider.    Take your next dose of Zithromax tomorrow morning.  Use the inhaler taking 2 puffs every 4 hours if you are  wheezing or coughing.  Use the spacer as instructed with this medication.  You may use the Tessalon and/or the codeine cough syrup to help control your cough symptoms.  Rest and make sure you drinking plenty of fluids.  Return here for any worsened symptoms.

## 2014-02-07 NOTE — ED Provider Notes (Signed)
Medical screening examination/treatment/procedure(s) were performed by non-physician practitioner and as supervising physician I was immediately available for consultation/collaboration.   EKG Interpretation None        Maudry Diego, MD 02/07/14 1537

## 2014-02-07 NOTE — ED Provider Notes (Signed)
CSN: [id number]     Arrival date & time 02/06/14  1045 History   First MD Initiated Contact with Patient 02/06/14 1119     Chief Complaint  Patient presents with  . Cough     (Consider location/radiation/quality/duration/timing/severity/associated sxs/prior Treatment) The history is provided by the patient.   Barbara Snow is a 48 y.o. female presenting with a 3 days history of uri type symptoms which includes nasal congestion with clear rhinorrhea, sore throat, low grade fever, cough productive of yellow, nonbloody sputum.  She's also had intermittent wheezing with this associated shortness of breath and generalized fatigue.  Symptoms due to not include chest pain,  Nausea, vomiting or diarrhea.  The patient has taken Mucinex and Robitussin prior to arrival with no significant improvement in symptoms.  She denies fevers or chills.  She is a former smoker.      Past Medical History  Diagnosis Date  . Hypertension   . Kidney stone    Past Surgical History  Procedure Laterality Date  . Abdominal hysterectomy    . Cholecystectomy    . Tubal ligation     No family history on file. History  Substance Use Topics  . Smoking status: Former Smoker    Types: Cigarettes    Quit date: 02/07/2012  . Smokeless tobacco: Not on file  . Alcohol Use: No   OB History   Grav Para Term Preterm Abortions TAB SAB Ect Mult Living                 Review of Systems  Constitutional: Positive for fatigue. Negative for fever.  HENT: Positive for congestion, postnasal drip and sore throat.   Eyes: Negative.   Respiratory: Positive for cough, chest tightness, shortness of breath and wheezing.   Cardiovascular: Negative for chest pain.  Gastrointestinal: Negative for nausea and abdominal pain.  Genitourinary: Negative.   Musculoskeletal: Negative for arthralgias, joint swelling and neck pain.  Skin: Negative.  Negative for rash and wound.  Neurological: Negative for dizziness, weakness,  light-headedness, numbness and headaches.  Psychiatric/Behavioral: Negative.       Allergies  Review of patient's allergies indicates no known allergies.  Home Medications   Prior to Admission medications   Medication Sig Start Date End Date Taking? Authorizing Provider  guaiFENesin (MUCINEX) 600 MG 12 hr tablet Take 600 mg by mouth 2 (two) times daily.   Yes Historical Provider, MD  guaifenesin (ROBITUSSIN) 100 MG/5ML syrup Take 200 mg by mouth 3 (three) times daily as needed for cough.   Yes Historical Provider, MD  lisinopril-hydrochlorothiazide (PRINZIDE,ZESTORETIC) 20-12.5 MG per tablet Take 1 tablet by mouth daily.   Yes Historical Provider, MD  azithromycin (ZITHROMAX Z-PAK) 250 MG tablet Take 1 tablet (250 mg total) by mouth daily. 02/07/14   Evalee Jefferson, PA-C  benzonatate (TESSALON) 100 MG capsule Take 2 capsules (200 mg total) by mouth 3 (three) times daily as needed for cough. 02/06/14   Evalee Jefferson, PA-C  promethazine-codeine (PHENERGAN WITH CODEINE) 6.25-10 MG/5ML syrup Take 5 mLs by mouth every 4 (four) hours as needed for cough. 02/06/14   Evalee Jefferson, PA-C   BP 143/76  Pulse 77  Temp(Src) 99 F (37.2 C) (Oral)  Resp 20  Ht 5\' 6"  (1.676 m)  Wt 230 lb (104.327 kg)  BMI 37.14 kg/m2  SpO2 96% Physical Exam  Constitutional: She is oriented to person, place, and time. She appears well-developed and well-nourished.  HENT:  Head: Normocephalic and atraumatic.  Right Ear: Tympanic  membrane and ear canal normal.  Left Ear: Tympanic membrane and ear canal normal.  Nose: Mucosal edema and rhinorrhea present.  Mouth/Throat: Uvula is midline, oropharynx is clear and moist and mucous membranes are normal. No oropharyngeal exudate, posterior oropharyngeal edema, posterior oropharyngeal erythema or tonsillar abscesses.  Mild posterior pharyngeal erythema.  Eyes: Conjunctivae are normal.  Cardiovascular: Normal rate and normal heart sounds.   Pulmonary/Chest: Effort normal. No  respiratory distress. She has wheezes. She has no rales.  Frequent coarse cough throughout exam.  Expiratory wheeze with prolonged expirations throughout all lung fields.  Abdominal: Soft. There is no tenderness.  Musculoskeletal: Normal range of motion.  Lymphadenopathy:    She has no cervical adenopathy.  Neurological: She is alert and oriented to person, place, and time.  Skin: Skin is warm and dry. No rash noted.  Psychiatric: She has a normal mood and affect.    ED Course  Procedures (including critical care time) Labs Review Labs Reviewed - No data to display  Imaging Review Dg Chest 2 View  02/06/2014   CLINICAL DATA:  Cough and wheezing for 3 days.  EXAM: CHEST  2 VIEW  COMPARISON:  PA and lateral chest 09/25/2011. Single view of the chest 11/10/2013.  FINDINGS: There is a small focus of airspace opacity in the left lower lobe. The right lung is clear. Heart size is normal. No pneumothorax or pleural effusion. Heart size is normal.  IMPRESSION: Small focus of left basilar airspace opacity could be due to atelectasis or infection.   Electronically Signed   By: Inge Rise M.D.   On: 02/06/2014 11:28     EKG Interpretation None      MDM   Final diagnoses:  Bronchitis, acute, with bronchospasm    Patient was given an albuterol and Atrovent nebulizer treatment which gave complete resolution of wheezing and improved aeration of all lung fields.  Given x-ray findings of suggestive possible early infection she was placed on Zithromax with first dose given here.  She was given an dose of Tussionex which did not greatly improve her  coughing, therefore was given Tessalon and also prescribed Phenergan with codeine for hopeful improvement in her cough symptoms.  She was encouraged rest, increase fluid intake, recheck here or by PCP for any worsened or persistent symptoms.  Patients labs and/or radiological studies were viewed and considered during the medical decision making and  disposition process.    Evalee Jefferson, PA-C 02/07/14 [phone]

## 2014-02-12 ENCOUNTER — Emergency Department (HOSPITAL_COMMUNITY)
Admission: EM | Admit: 2014-02-12 | Discharge: 2014-02-12 | Disposition: A | Discharge status: Home / Self Care | Payer: BC Managed Care – PPO | Attending: Emergency Medicine | Admitting: Emergency Medicine

## 2014-02-12 ENCOUNTER — Encounter (HOSPITAL_COMMUNITY): Payer: Self-pay | Admitting: Emergency Medicine

## 2014-02-12 DIAGNOSIS — J40 Bronchitis, not specified as acute or chronic: Secondary | ICD-10-CM | POA: Diagnosis not present

## 2014-02-12 DIAGNOSIS — Z79899 Other long term (current) drug therapy: Secondary | ICD-10-CM | POA: Diagnosis not present

## 2014-02-12 DIAGNOSIS — R059 Cough, unspecified: Secondary | ICD-10-CM | POA: Insufficient documentation

## 2014-02-12 DIAGNOSIS — IMO0002 Reserved for concepts with insufficient information to code with codable children: Secondary | ICD-10-CM | POA: Diagnosis not present

## 2014-02-12 DIAGNOSIS — Z87442 Personal history of urinary calculi: Secondary | ICD-10-CM | POA: Diagnosis not present

## 2014-02-12 DIAGNOSIS — R05 Cough: Secondary | ICD-10-CM | POA: Insufficient documentation

## 2014-02-12 DIAGNOSIS — Z792 Long term (current) use of antibiotics: Secondary | ICD-10-CM | POA: Insufficient documentation

## 2014-02-12 DIAGNOSIS — Z87891 Personal history of nicotine dependence: Secondary | ICD-10-CM | POA: Insufficient documentation

## 2014-02-12 DIAGNOSIS — I10 Essential (primary) hypertension: Secondary | ICD-10-CM | POA: Insufficient documentation

## 2014-02-12 DIAGNOSIS — E663 Overweight: Secondary | ICD-10-CM | POA: Insufficient documentation

## 2014-02-12 MED ORDER — HYDROCODONE-ACETAMINOPHEN 7.5-325 MG/15ML PO SOLN
10.0000 mL | Freq: Once | ORAL | Status: AC
  Administered 2014-02-12: 10 mL via ORAL
  Filled 2014-02-12: qty 15

## 2014-02-12 MED ORDER — PREDNISONE 20 MG PO TABS: 60.0000 mg | ORAL_TABLET | Freq: Every day | ORAL | Status: DC

## 2014-02-12 MED ORDER — HYDROCODONE-ACETAMINOPHEN 7.5-325 MG/15ML PO SOLN: 10.0000 mL | Freq: Four times a day (QID) | ORAL | Status: DC | PRN

## 2014-02-12 MED ORDER — PREDNISONE 50 MG PO TABS
60.0000 mg | ORAL_TABLET | Freq: Once | ORAL | Status: AC
  Administered 2014-02-12: 60 mg via ORAL
  Filled 2014-02-12 (×2): qty 1

## 2014-02-12 NOTE — ED Notes (Signed)
Pt seen in ED for cough last week, pt states she feels like cough has worsened

## 2014-02-12 NOTE — ED Notes (Signed)
Pt alert & oriented x4, stable gait. Patient given discharge instructions, paperwork & prescription(s). Patient  instructed to stop at the registration desk to finish any additional paperwork. Patient verbalized understanding. Pt left department w/ no further questions. 

## 2014-02-12 NOTE — ED Provider Notes (Signed)
CSN: [id number]     Arrival date & time 02/12/14  0702 History   First MD Initiated Contact with Patient 02/12/14 [phone]     Chief Complaint  Patient presents with  . Cough    x 1 week     (Consider location/radiation/quality/duration/timing/severity/associated sxs/prior Treatment) HPI  This is a 48 year old female with history of hypertension and kidney stones who presents with persistent cough. Patient was evaluated last week and diagnosed with bronchitis. She was given a course of Zithromax and Tessalon Perles. Patient reports persistent nonproductive cough since that time. She states that it is keeping her up at night. She states that nothing is helping including Tessalon Perles, Tussionex, her albuterol inhaler. She is a former smoker.  And is exposed daily to secondhand smoke. She denies any fevers, chest pain, shortness of breath. She reports that she took a full course of antibiotics as directed.  Patient otherwise just endorses rhinorrhea.  Past Medical History  Diagnosis Date  . Hypertension   . Kidney stone    Past Surgical History  Procedure Laterality Date  . Abdominal hysterectomy    . Cholecystectomy    . Tubal ligation     No family history on file. History  Substance Use Topics  . Smoking status: Former Smoker    Types: Cigarettes    Quit date: 02/07/2012  . Smokeless tobacco: Not on file  . Alcohol Use: No   OB History   Grav Para Term Preterm Abortions TAB SAB Ect Mult Living                 Review of Systems  Constitutional: Negative for fever and chills.  HENT: Positive for rhinorrhea.   Respiratory: Positive for cough. Negative for chest tightness and shortness of breath.   Cardiovascular: Negative for chest pain.  Gastrointestinal: Negative for nausea, vomiting and abdominal pain.  Genitourinary: Negative for dysuria.  Neurological: Negative for headaches.  All other systems reviewed and are negative.     Allergies  Review of patient's  allergies indicates no known allergies.  Home Medications   Prior to Admission medications   Medication Sig Start Date End Date Taking? Authorizing Provider  azithromycin (ZITHROMAX Z-PAK) 250 MG tablet Take 1 tablet (250 mg total) by mouth daily. 02/07/14   Evalee Jefferson, PA-C  benzonatate (TESSALON) 100 MG capsule Take 2 capsules (200 mg total) by mouth 3 (three) times daily as needed for cough. 02/06/14   Evalee Jefferson, PA-C  guaiFENesin (MUCINEX) 600 MG 12 hr tablet Take 600 mg by mouth 2 (two) times daily.    Historical Provider, MD  guaifenesin (ROBITUSSIN) 100 MG/5ML syrup Take 200 mg by mouth 3 (three) times daily as needed for cough.    Historical Provider, MD  HYDROcodone-acetaminophen (HYCET) 7.5-325 mg/15 ml solution Take 10 mLs by mouth every 6 (six) hours as needed (cough). 02/12/14   Merryl Hacker, MD  lisinopril-hydrochlorothiazide (PRINZIDE,ZESTORETIC) 20-12.5 MG per tablet Take 1 tablet by mouth daily.    Historical Provider, MD  predniSONE (DELTASONE) 20 MG tablet Take 3 tablets (60 mg total) by mouth daily with breakfast. 02/12/14   Merryl Hacker, MD  promethazine-codeine (PHENERGAN WITH CODEINE) 6.25-10 MG/5ML syrup Take 5 mLs by mouth every 4 (four) hours as needed for cough. 02/06/14   Evalee Jefferson, PA-C   BP 142/75  Pulse 67  Temp(Src) 99.1 F (37.3 C) (Oral)  Resp 18  Ht 5\' 5"  (1.651 m)  Wt 230 lb (104.327 kg)  BMI 38.27 kg/m2  SpO2 98% Physical Exam  Nursing note and vitals reviewed. Constitutional: She is oriented to person, place, and time.  Overweight, active cough  HENT:  Head: Normocephalic and atraumatic.  Mouth/Throat: Oropharynx is clear and moist.  Cardiovascular: Normal rate, regular rhythm and normal heart sounds.   No murmur heard. Pulmonary/Chest: Effort normal and breath sounds normal. No respiratory distress. She has no wheezes.  Scant wheeze  Abdominal: Soft. There is no tenderness.  Musculoskeletal: She exhibits no edema.  Neurological: She  is alert and oriented to person, place, and time.  Skin: Skin is warm and dry.  Psychiatric: She has a normal mood and affect.    ED Course  Procedures (including critical care time) Labs Review Labs Reviewed - No data to display  Imaging Review No results found.   EKG Interpretation None      MDM   Final diagnoses:  Bronchitis    Patient presents with continued cough. Nontoxic on exam. Has been afebrile and cough is nonproductive. She does have extensive exposure to secondhand smoke and is a former smoker. Scant wheezing on exam. Will add steroids to patient's regimen. Patient was given Hycet with some improvement of her cough. Discuss with patient continued symptomatic support. At this time I feel repeat imaging would be low yield. Will send patient home with a short course of Hycet given that she has exhausted all other over-the-counter medications for cough.  Patient also given 4 more days of prednisone. Discussed the patient this is a narcotic pain medication and she should not drive. Patient stated understanding.  After history, exam, and medical workup I feel the patient has been appropriately medically screened and is safe for discharge home. Pertinent diagnoses were discussed with the patient. Patient was given return precautions.    Merryl Hacker, MD 02/12/14 [phone]

## 2014-02-12 NOTE — Discharge Instructions (Signed)
Acute Bronchitis  He needs to avoid smoking if at all possible. He will be given a course of steroids and veryt short course of Hycet.  Will use for cough, also has pain medication. You should not drive or operate heavy machinery.  Bronchitis is inflammation of the airways that extend from the windpipe into the lungs (bronchi). The inflammation often causes mucus to develop. This leads to a cough, which is the most common symptom of bronchitis.  In acute bronchitis, the condition usually develops suddenly and goes away over time, usually in a couple weeks. Smoking, allergies, and asthma can make bronchitis worse. Repeated episodes of bronchitis may cause further lung problems.  CAUSES Acute bronchitis is most often caused by the same virus that causes a cold. The virus can spread from person to person (contagious) through coughing, sneezing, and touching contaminated objects. SIGNS AND SYMPTOMS   Cough.   Fever.   Coughing up mucus.   Body aches.   Chest congestion.   Chills.   Shortness of breath.   Sore throat.  DIAGNOSIS  Acute bronchitis is usually diagnosed through a physical exam. Your health care provider will also ask you questions about your medical history. Tests, such as chest X-rays, are sometimes done to rule out other conditions.  TREATMENT  Acute bronchitis usually goes away in a couple weeks. Oftentimes, no medical treatment is necessary. Medicines are sometimes given for relief of fever or cough. Antibiotic medicines are usually not needed but may be prescribed in certain situations. In some cases, an inhaler may be recommended to help reduce shortness of breath and control the cough. A cool mist vaporizer may also be used to help thin bronchial secretions and make it easier to clear the chest.  HOME CARE INSTRUCTIONS  Get plenty of rest.   Drink enough fluids to keep your urine clear or pale yellow (unless you have a medical condition that requires fluid  restriction). Increasing fluids may help thin your respiratory secretions (sputum) and reduce chest congestion, and it will prevent dehydration.   Take medicines only as directed by your health care provider.  If you were prescribed an antibiotic medicine, finish it all even if you start to feel better.  Avoid smoking and secondhand smoke. Exposure to cigarette smoke or irritating chemicals will make bronchitis worse. If you are a smoker, consider using nicotine gum or skin patches to help control withdrawal symptoms. Quitting smoking will help your lungs heal faster.   Reduce the chances of another bout of acute bronchitis by washing your hands frequently, avoiding people with cold symptoms, and trying not to touch your hands to your mouth, nose, or eyes.   Keep all follow-up visits as directed by your health care provider.  SEEK MEDICAL CARE IF: Your symptoms do not improve after 1 week of treatment.  SEEK IMMEDIATE MEDICAL CARE IF:  You develop an increased fever or chills.   You have chest pain.   You have severe shortness of breath.  You have bloody sputum.   You develop dehydration.  You faint or repeatedly feel like you are going to pass out.  You develop repeated vomiting.  You develop a severe headache. MAKE SURE YOU:   Understand these instructions.  Will watch your condition.  Will get help right away if you are not doing well or get worse. Document Released: 07/23/2004 Document Revised: 10/30/2013 Document Reviewed: 12/06/2012 [facility] Patient Information 2015 [place], [place]. This information is not intended to replace advice given to you  by your health care provider. Make sure you discuss any questions you have with your health care provider. ° °

## 2015-08-14 ENCOUNTER — Emergency Department (HOSPITAL_COMMUNITY)
Admission: EM | Admit: 2015-08-14 | Discharge: 2015-08-14 | Disposition: A | Discharge status: Home / Self Care | Payer: Self-pay | Attending: Emergency Medicine | Admitting: Emergency Medicine

## 2015-08-14 ENCOUNTER — Emergency Department (HOSPITAL_COMMUNITY): Payer: Self-pay

## 2015-08-14 ENCOUNTER — Encounter (HOSPITAL_COMMUNITY): Payer: Self-pay

## 2015-08-14 DIAGNOSIS — R197 Diarrhea, unspecified: Secondary | ICD-10-CM | POA: Insufficient documentation

## 2015-08-14 DIAGNOSIS — R6889 Other general symptoms and signs: Secondary | ICD-10-CM

## 2015-08-14 DIAGNOSIS — M791 Myalgia: Secondary | ICD-10-CM | POA: Insufficient documentation

## 2015-08-14 DIAGNOSIS — R0602 Shortness of breath: Secondary | ICD-10-CM | POA: Insufficient documentation

## 2015-08-14 DIAGNOSIS — Z87891 Personal history of nicotine dependence: Secondary | ICD-10-CM | POA: Insufficient documentation

## 2015-08-14 DIAGNOSIS — R6883 Chills (without fever): Secondary | ICD-10-CM | POA: Insufficient documentation

## 2015-08-14 DIAGNOSIS — Z87442 Personal history of urinary calculi: Secondary | ICD-10-CM | POA: Insufficient documentation

## 2015-08-14 DIAGNOSIS — R05 Cough: Secondary | ICD-10-CM | POA: Insufficient documentation

## 2015-08-14 DIAGNOSIS — R51 Headache: Secondary | ICD-10-CM | POA: Insufficient documentation

## 2015-08-14 DIAGNOSIS — J3489 Other specified disorders of nose and nasal sinuses: Secondary | ICD-10-CM | POA: Insufficient documentation

## 2015-08-14 DIAGNOSIS — R062 Wheezing: Secondary | ICD-10-CM | POA: Insufficient documentation

## 2015-08-14 DIAGNOSIS — I10 Essential (primary) hypertension: Secondary | ICD-10-CM | POA: Insufficient documentation

## 2015-08-14 DIAGNOSIS — Z79899 Other long term (current) drug therapy: Secondary | ICD-10-CM | POA: Insufficient documentation

## 2015-08-14 MED ORDER — LOPERAMIDE HCL 2 MG PO TABS: 2.0000 mg | ORAL_TABLET | Freq: Four times a day (QID) | ORAL | Status: DC | PRN

## 2015-08-14 MED ORDER — DM-GUAIFENESIN ER 30-600 MG PO TB12: 1.0000 | ORAL_TABLET | Freq: Two times a day (BID) | ORAL | Status: DC

## 2015-08-14 NOTE — ED Provider Notes (Signed)
CSN: [id number]     Arrival date & time 08/14/15  [id number] History  By signing my name below, I, Eustaquio Maize, attest that this documentation has been prepared under the direction and in the presence of Fredia Sorrow, MD. Electronically Signed: Eustaquio Maize, ED Scribe. 08/14/2015. 9:23 AM.   Chief Complaint  Patient presents with  . Cough  . Emesis   Patient is a 50 y.o. female presenting with cough and vomiting. The history is provided by the patient. No language interpreter was used.  Cough Cough characteristics:  Productive Sputum characteristics:  Yellow Severity:  Moderate Onset quality:  Gradual Duration:  3 days Timing:  Constant Progression:  Unchanged Chronicity:  New Ineffective treatments: inhaler. Associated symptoms: chills, headaches, myalgias, rhinorrhea, shortness of breath and wheezing   Associated symptoms: no chest pain, no fever, no rash and no sore throat   Emesis Associated symptoms: chills, diarrhea, headaches and myalgias   Associated symptoms: no abdominal pain and no sore throat      HPI Comments: Barbara Snow is a 50 y.o. female who presents to the Emergency Department complaining of gradual onset, constant, productive cough with yellow sputum x 3 days. Pt also complains of nasal congestion, wheezing, chills, myalgias, headache, shortness of breath, and rhinorrhea. She mentions that she began having diarrhea last night and reports 3 episodes since onset. Pt has been using her inhaler without relief. Denies fever, hematochezia, nausea, vomiting, or any other associated symptoms.   Past Medical History  Diagnosis Date  . Hypertension   . Kidney stone    Past Surgical History  Procedure Laterality Date  . Abdominal hysterectomy    . Cholecystectomy    . Tubal ligation     No family history on file. Social History  Substance Use Topics  . Smoking status: Former Smoker    Types: Cigarettes    Quit date: 02/07/2012  . Smokeless tobacco: None  .  Alcohol Use: No   OB History    No data available     Review of Systems  Constitutional: Positive for chills. Negative for fever.  HENT: Positive for congestion and rhinorrhea. Negative for sore throat.   Eyes: Negative for visual disturbance.  Respiratory: Positive for cough, shortness of breath and wheezing.   Cardiovascular: Negative for chest pain and leg swelling.  Gastrointestinal: Positive for diarrhea. Negative for nausea, vomiting, abdominal pain and blood in stool.  Genitourinary: Negative for dysuria and hematuria.  Musculoskeletal: Positive for myalgias. Negative for back pain and neck pain.  Skin: Negative for rash.  Neurological: Positive for headaches.  Hematological: Does not bruise/bleed easily.  Psychiatric/Behavioral: Negative for confusion.      Allergies  Review of patient's allergies indicates no known allergies.  Home Medications   Prior to Admission medications   Medication Sig Start Date End Date Taking? Authorizing Provider  lisinopril-hydrochlorothiazide (PRINZIDE,ZESTORETIC) 20-12.5 MG per tablet Take 1 tablet by mouth daily.   Yes Historical Provider, MD  dextromethorphan-guaiFENesin (MUCINEX DM) 30-600 MG 12hr tablet Take 1 tablet by mouth 2 (two) times daily. 08/14/15   Fredia Sorrow, MD  loperamide (IMODIUM A-D) 2 MG tablet Take 1 tablet (2 mg total) by mouth 4 (four) times daily as needed for diarrhea or loose stools. 08/14/15   Fredia Sorrow, MD   BP 211/102 mmHg  Pulse 85  Temp(Src) 98.6 F (37 C) (Oral)  Resp 20  Ht 5\' 2"  (1.575 m)  Wt 99.791 kg  BMI 40.23 kg/m2  SpO2 97%   Physical  Exam  Constitutional: She is oriented to person, place, and time. She appears well-developed and well-nourished. No distress.  HENT:  Head: Normocephalic and atraumatic.  Mouth/Throat: Uvula is midline, oropharynx is clear and moist and mucous membranes are normal. No oropharyngeal exudate.  Eyes: Conjunctivae and EOM are normal. Pupils are equal,  round, and reactive to light. Right eye exhibits no discharge. Left eye exhibits no discharge.  Neck: Neck supple. No tracheal deviation present.  Cardiovascular: Normal rate, regular rhythm and normal heart sounds.   Pulmonary/Chest: Effort normal and breath sounds normal. No respiratory distress. She has no wheezes. She has no rales.  Abdominal: Soft. Bowel sounds are normal. There is no tenderness.  Musculoskeletal: Normal range of motion. She exhibits no edema.  Neurological: She is alert and oriented to person, place, and time.  Skin: Skin is warm and dry.  Psychiatric: She has a normal mood and affect. Her behavior is normal.  Nursing note and vitals reviewed.   ED Course  Procedures (including critical care time)  DIAGNOSTIC STUDIES: Oxygen Saturation is 97% on RA, normal by my interpretation.    COORDINATION OF CARE: 9:12 AM-Discussed treatment plan which includes CXR with pt at bedside and pt agreed to plan.   Labs Review Labs Reviewed - No data to display  Imaging Review Dg Chest 2 View  08/14/2015  CLINICAL DATA:  Cough, fever, congestion and chills. Three days duration. EXAM: CHEST  2 VIEW COMPARISON:  02/06/2014 FINDINGS: Heart size is normal. Mediastinal shadows are normal. The lungs are clear. No bronchial thickening. No infiltrate, mass, effusion or collapse. Pulmonary vascularity is normal. No bony abnormality. IMPRESSION: Normal chest Electronically Signed   By: Nelson Chimes M.D.   On: 08/14/2015 09:53   I have personally reviewed and evaluated these images as part of my medical decision-making.   EKG Interpretation None      MDM   Final diagnoses:  Flu-like symptoms    Patient with flulike symptoms. Chest x-ray negative for pneumonia we'll treat symptomatically. Patient nontoxic no acute distress. Patient states she does have her blood pressure medicines at home. She will take them.     Fredia Sorrow, MD 08/14/15 1016

## 2015-08-14 NOTE — ED Notes (Signed)
Pt reports productive cough x 3 days with yellow sputum.  Also c/o n/v/d.   Unknown if has had fever.

## 2015-08-14 NOTE — Discharge Instructions (Signed)
Chest x-ray negative for pneumonia. Take Mucinex DM as directed. Work note provided. Imodium right ear as for diarrhea should persist. Return for any new or worse symptoms

## 2016-02-25 ENCOUNTER — Encounter (HOSPITAL_COMMUNITY): Payer: Self-pay

## 2016-02-25 ENCOUNTER — Emergency Department (HOSPITAL_COMMUNITY)
Admission: EM | Admit: 2016-02-25 | Discharge: 2016-02-25 | Disposition: A | Discharge status: Home / Self Care | Payer: Self-pay | Attending: Emergency Medicine | Admitting: Emergency Medicine

## 2016-02-25 DIAGNOSIS — Y93F2 Activity, caregiving, lifting: Secondary | ICD-10-CM | POA: Insufficient documentation

## 2016-02-25 DIAGNOSIS — Z79899 Other long term (current) drug therapy: Secondary | ICD-10-CM | POA: Insufficient documentation

## 2016-02-25 DIAGNOSIS — Y929 Unspecified place or not applicable: Secondary | ICD-10-CM | POA: Insufficient documentation

## 2016-02-25 DIAGNOSIS — X500XXA Overexertion from strenuous movement or load, initial encounter: Secondary | ICD-10-CM | POA: Insufficient documentation

## 2016-02-25 DIAGNOSIS — Z87891 Personal history of nicotine dependence: Secondary | ICD-10-CM | POA: Insufficient documentation

## 2016-02-25 DIAGNOSIS — Y999 Unspecified external cause status: Secondary | ICD-10-CM | POA: Insufficient documentation

## 2016-02-25 DIAGNOSIS — R0789 Other chest pain: Secondary | ICD-10-CM | POA: Insufficient documentation

## 2016-02-25 DIAGNOSIS — I10 Essential (primary) hypertension: Secondary | ICD-10-CM | POA: Insufficient documentation

## 2016-02-25 LAB — I-STAT TROPONIN, ED: Troponin i, poc: 0 ng/mL (ref 0.00–0.08)

## 2016-02-25 MED ORDER — CYCLOBENZAPRINE HCL 10 MG PO TABS: 10.0000 mg | ORAL_TABLET | Freq: Two times a day (BID) | ORAL | 0 refills | Status: DC | PRN

## 2016-02-25 MED ORDER — NAPROXEN 500 MG PO TABS: 500.0000 mg | ORAL_TABLET | Freq: Two times a day (BID) | ORAL | 0 refills | Status: DC | PRN

## 2016-02-25 MED ORDER — KETOROLAC TROMETHAMINE 60 MG/2ML IM SOLN
60.0000 mg | Freq: Once | INTRAMUSCULAR | Status: AC
  Administered 2016-02-25: 60 mg via INTRAMUSCULAR
  Filled 2016-02-25: qty 2

## 2016-02-25 NOTE — Discharge Instructions (Signed)
If you were given medicines take as directed.  If you are on coumadin or contraceptives realize their levels and effectiveness is altered by many different medicines.  If you have any reaction (rash, tongues swelling, other) to the medicines stop taking and see a physician.    If your blood pressure was elevated in the ER make sure you follow up for management with a primary doctor or return for chest pain, shortness of breath or stroke symptoms.  Please follow up as directed and return to the ER or see a physician for new or worsening symptoms.  Thank you. Vitals:   02/25/16 0758 02/25/16 0759 02/25/16 0801  BP:   (!) 193/112  Pulse:  89   Resp:  18   Temp:  98.1 F (36.7 C)   TempSrc:  Oral   SpO2:  98%   Weight: 230 lb (104.3 kg)    Height: 5\' 6"  (1.676 m)

## 2016-02-25 NOTE — ED Notes (Signed)
Pt has worsening chest pain upon movement. 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 Pain is reproduce able with movement.

## 2016-02-25 NOTE — ED Provider Notes (Signed)
[facility] DEPT Provider Note   CSN: [id number] Arrival date & time: 02/25/16  [id number]  By signing my name below, I, Soijett Blue, attest that this documentation has been prepared under the direction and in the presence of Elnora Morrison, MD. Electronically Signed: Soijett Blue, ED Scribe. 02/25/16. 8:17 AM.    History   Chief Complaint Chief Complaint  Patient presents with  . Chest Pain    HPI  Barbara Snow is a 50 y.o. female with a medical hx of HTN, who presents to the Emergency Department complaining of left sided CP onset 2 days. 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 Pt also reports that she works 10 days consecutively and 10 hour shifts with 4 days off at a time. Pt notes that her left sided CP is worsened with movement and deep breathing. Pt denies any alleviating factors. She states that she has tried tylenol and ibuprofen for the relief of her symptoms. She denies fever, diaphoresis, cough, SOB, abdominal pain, and any other symptoms. Pt denies PMHx of DM, MI, recent surgery, DVT, PE, kidney issues, or smoking cigarettes. Pt states that her mother had a MI last year and is 44.     The history is provided by the patient. No language interpreter was used.  Chest Pain   This is a new problem. The current episode started yesterday. The problem occurs rarely. The problem has not changed since onset.The pain is associated with lifting, movement and breathing. Pain location: left sided. The pain is mild. Quality: pulling  The pain does not radiate. Duration of episode(s) is 2 days. The symptoms are aggravated by deep breathing (heavy lifting). Pertinent negatives include no abdominal pain, no cough, no diaphoresis, no fever and no shortness of breath.  Her past medical history is significant for hypertension.    Past Medical  History:  Diagnosis Date  . Hypertension   . Kidney stone     There are no active problems to display for this patient.   Past Surgical History:  Procedure Laterality Date  . ABDOMINAL HYSTERECTOMY    . CHOLECYSTECTOMY    . TUBAL LIGATION      OB History    No data available       Home Medications    Prior to Admission medications   Medication Sig Start Date End Date Taking? Authorizing Provider  acetaminophen (TYLENOL) 500 MG tablet Take 1,000 mg by mouth every 6 (six) hours as needed for moderate pain.   Yes Historical Provider, MD  albuterol (PROVENTIL HFA;VENTOLIN HFA) 108 (90 Base) MCG/ACT inhaler Inhale 2 puffs into the lungs every 6 (six) hours as needed for wheezing or shortness of breath.   Yes Historical Provider, MD  ibuprofen (ADVIL,MOTRIN) 200 MG tablet Take 400 mg by mouth every 6 (six) hours as needed for moderate pain.   Yes Historical Provider, MD  lisinopril-hydrochlorothiazide (PRINZIDE,ZESTORETIC) 20-12.5 MG per tablet Take 1 tablet by mouth at bedtime.    Yes Historical Provider, MD  cyclobenzaprine (FLEXERIL) 10 MG tablet Take 1 tablet (10 mg total) by mouth 2 (two) times daily as needed for muscle spasms. 02/25/16   Elnora Morrison, MD  naproxen (NAPROSYN) 500 MG tablet Take 1 tablet (500 mg total) by mouth 2 (two) times daily as needed. 02/25/16   Elnora Morrison, MD  Family History No family history on file.  Social History Social History  Substance Use Topics  . Smoking status: Former Smoker    Types: Cigarettes    Quit date: 02/07/2012  . Smokeless tobacco: Never Used  . Alcohol use No     Allergies   Review of patient's allergies indicates no known allergies.   Review of Systems Review of Systems  Constitutional: Negative for diaphoresis and fever.  Respiratory: Negative for cough and shortness of breath.   Cardiovascular: Positive for chest pain.  Gastrointestinal: Negative for abdominal pain.  All other systems reviewed and are  negative.    Physical Exam Updated Vital Signs BP (!) 193/112 (BP Location: Right Arm)   Pulse 89   Temp 98.1 F (36.7 C) (Oral)   Resp 18   Ht 5\' 6"  (1.676 m)   Wt 230 lb (104.3 kg)   SpO2 98%   BMI 37.12 kg/m   Physical Exam  Constitutional: She is oriented to person, place, and time. She appears well-developed and well-nourished. No distress.  HENT:  Head: Normocephalic and atraumatic.  Eyes: EOM are normal.  Neck: Neck supple.  Cardiovascular: Normal rate, regular rhythm and normal heart sounds.  Exam reveals no gallop and no friction rub.   No murmur heard. Pulses:      Radial pulses are 2+ on the right side, and 2+ on the left side.  2+ bilateral radial pulses.  Pulmonary/Chest: Effort normal and breath sounds normal. No respiratory distress. She has no wheezes. She has no rales. She exhibits tenderness.  Anterior lung fields clear. TTP left anterior upper chest. Tenderness reproduced with flexion of pectoralis muscle on the left.   Abdominal: Soft. She exhibits no distension. There is no tenderness.  Musculoskeletal: Normal range of motion.  Neurological: She is alert and oriented to person, place, and time.  Skin: Skin is warm and dry.  Psychiatric: She has a normal mood and affect. Her behavior is normal.  Nursing note and vitals reviewed.    ED Treatments / Results  DIAGNOSTIC STUDIES: Oxygen Saturation is 98% on RA, nl by my interpretation.    COORDINATION OF CARE: 8:15 AM Discussed treatment plan with pt at bedside which includes toradol injection, labs, EKG, follow up with PCP, and pt agreed to plan.   Labs (all labs ordered are listed, but only abnormal results are displayed) Labs Reviewed  Randolm Idol, ED    EKG  EKG Interpretation  Date/Time:  Tuesday February 25 2016 07:59:05 EDT Ventricular Rate:  85 PR Interval:    QRS Duration: 97 QT Interval:  360 QTC Calculation: 428 R Axis:   55 Text Interpretation:  Sinus rhythm Confirmed by  Pernell Dikes MD, Vonna Kotyk [phone]) on 02/25/2016 8:14:14 AM        Procedures Procedures (including critical care time)  Medications Ordered in ED Medications  ketorolac (TORADOL) injection 60 mg (60 mg Intramuscular Given 02/25/16 0846)     Initial Impression / Assessment and Plan / ED Course  I have reviewed the triage vital signs and the nursing notes.  Pertinent labs & imaging results that were available during my care of the patient were reviewed by me and considered in my medical decision making (see chart for details).  Clinical Course   Pt with MSK CP.  Screening ekg and troponin neg.  Discussed reasons to return.  Work note and muscle relaxant.  Pt needs outpt bp management.   Results and differential diagnosis were discussed with the patient/parent/guardian. Xrays were  independently reviewed by myself.  Close follow up outpatient was discussed, comfortable with the plan.   Medications  ketorolac (TORADOL) injection 60 mg (60 mg Intramuscular Given 02/25/16 0846)    Vitals:   02/25/16 0758 02/25/16 0759 02/25/16 0801  BP:   (!) 193/112  Pulse:  89   Resp:  18   Temp:  98.1 F (36.7 C)   TempSrc:  Oral   SpO2:  98%   Weight: 230 lb (104.3 kg)    Height: 5\' 6"  (1.676 m)      Final diagnoses:  Chest wall pain  HTN   Final Clinical Impressions(s) / ED Diagnoses   Final diagnoses:  Chest wall pain  HTN  New Prescriptions New Prescriptions   CYCLOBENZAPRINE (FLEXERIL) 10 MG TABLET    Take 1 tablet (10 mg total) by mouth 2 (two) times daily as needed for muscle spasms.   NAPROXEN (NAPROSYN) 500 MG TABLET    Take 1 tablet (500 mg total) by mouth 2 (two) times daily as needed.       Elnora Morrison, MD 02/25/16 [phone]

## 2016-02-25 NOTE — ED Triage Notes (Signed)
Pt reports pain in left chest since yesterday.  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  Pt says the pain in her chest feels like a "pulling sensation" and is worse with movement and deep breaths.

## 2016-05-06 ENCOUNTER — Encounter (HOSPITAL_COMMUNITY): Payer: Self-pay | Admitting: Emergency Medicine

## 2016-05-06 ENCOUNTER — Emergency Department (HOSPITAL_COMMUNITY)
Admission: EM | Admit: 2016-05-06 | Discharge: 2016-05-06 | Disposition: A | Discharge status: Home / Self Care | Payer: Self-pay | Attending: Emergency Medicine | Admitting: Emergency Medicine

## 2016-05-06 DIAGNOSIS — R1110 Vomiting, unspecified: Secondary | ICD-10-CM

## 2016-05-06 DIAGNOSIS — Z791 Long term (current) use of non-steroidal anti-inflammatories (NSAID): Secondary | ICD-10-CM | POA: Insufficient documentation

## 2016-05-06 DIAGNOSIS — Z79899 Other long term (current) drug therapy: Secondary | ICD-10-CM | POA: Insufficient documentation

## 2016-05-06 DIAGNOSIS — K529 Noninfective gastroenteritis and colitis, unspecified: Secondary | ICD-10-CM | POA: Insufficient documentation

## 2016-05-06 DIAGNOSIS — Z87891 Personal history of nicotine dependence: Secondary | ICD-10-CM | POA: Insufficient documentation

## 2016-05-06 DIAGNOSIS — I10 Essential (primary) hypertension: Secondary | ICD-10-CM | POA: Insufficient documentation

## 2016-05-06 LAB — CBC
HEMATOCRIT: 37.4 % (ref 36.0–46.0)
Hemoglobin: 12.9 g/dL (ref 12.0–15.0)
MCH: 30 pg (ref 26.0–34.0)
MCHC: 34.5 g/dL (ref 30.0–36.0)
MCV: 87 fL (ref 78.0–100.0)
Platelets: 222 10*3/uL (ref 150–400)
RBC: 4.3 MIL/uL (ref 3.87–5.11)
RDW: 13.5 % (ref 11.5–15.5)
WBC: 7.3 10*3/uL (ref 4.0–10.5)

## 2016-05-06 LAB — COMPREHENSIVE METABOLIC PANEL
ALT: 18 U/L (ref 14–54)
AST: 19 U/L (ref 15–41)
Albumin: 3.9 g/dL (ref 3.5–5.0)
Alkaline Phosphatase: 73 U/L (ref 38–126)
Anion gap: 5 (ref 5–15)
BUN: 14 mg/dL (ref 6–20)
CHLORIDE: 103 mmol/L (ref 101–111)
CO2: 28 mmol/L (ref 22–32)
Calcium: 9.2 mg/dL (ref 8.9–10.3)
Creatinine, Ser: 0.73 mg/dL (ref 0.44–1.00)
Glucose, Bld: 101 mg/dL — ABNORMAL HIGH (ref 65–99)
POTASSIUM: 3.8 mmol/L (ref 3.5–5.1)
SODIUM: 136 mmol/L (ref 135–145)
Total Bilirubin: 0.9 mg/dL (ref 0.3–1.2)
Total Protein: 8 g/dL (ref 6.5–8.1)

## 2016-05-06 LAB — URINALYSIS, ROUTINE W REFLEX MICROSCOPIC
Bilirubin Urine: NEGATIVE
GLUCOSE, UA: NEGATIVE mg/dL
Hgb urine dipstick: NEGATIVE
Ketones, ur: NEGATIVE mg/dL
LEUKOCYTES UA: NEGATIVE
Nitrite: NEGATIVE
PH: 6 (ref 5.0–8.0)
Protein, ur: NEGATIVE mg/dL
SPECIFIC GRAVITY, URINE: 1.01 (ref 1.005–1.030)

## 2016-05-06 MED ORDER — KETOROLAC TROMETHAMINE 30 MG/ML IJ SOLN
30.0000 mg | Freq: Once | INTRAMUSCULAR | Status: AC
  Administered 2016-05-06: 30 mg via INTRAVENOUS
  Filled 2016-05-06: qty 1

## 2016-05-06 MED ORDER — SODIUM CHLORIDE 0.9 % IV BOLUS (SEPSIS)
2000.0000 mL | Freq: Once | INTRAVENOUS | Status: AC
  Administered 2016-05-06: 2000 mL via INTRAVENOUS

## 2016-05-06 MED ORDER — ONDANSETRON HCL 4 MG/2ML IJ SOLN
4.0000 mg | Freq: Once | INTRAMUSCULAR | Status: AC
  Administered 2016-05-06: 4 mg via INTRAVENOUS
  Filled 2016-05-06: qty 2

## 2016-05-06 MED ORDER — ONDANSETRON 4 MG PO TBDP: ORAL_TABLET | ORAL | 0 refills | Status: DC

## 2016-05-06 NOTE — ED Notes (Signed)
Pt given d/c instructions at this time. Prescription reviewed. Pt denied any further needs or concerns at this time.

## 2016-05-06 NOTE — ED Notes (Signed)
Pt resting with eyes closed, appears to be in no distress. Respirations are even and unlabored.  

## 2016-05-06 NOTE — ED Triage Notes (Signed)
Pt reports nausea,emesis,body aches, dizziness since Sunday. Pt reports diarrhea started this am. 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

## 2016-05-06 NOTE — ED Provider Notes (Signed)
[facility] DEPT Provider Note   CSN: [id number] Arrival date & time: 05/06/16  [id number]   By signing my name below, I, Delton Prairie, attest that this documentation has been prepared under the direction and in the presence of Milton Ferguson, MD  Electronically Signed: Delton Prairie, ED Scribe. 05/06/16. 11:06 AM.   History   Chief Complaint Chief Complaint  Patient presents with  . Emesis   The history is provided by the patient. No language interpreter was used.  Emesis   The current episode started 2 days ago. The problem has not changed since onset.There has been no fever. Associated symptoms include abdominal pain and diarrhea. Pertinent negatives include no cough and no headaches. Risk factors include ill contacts.   HPI Comments:  Barbara Snow is a 50 y.o. female who presents to the Emergency Department complaining of sudden onset, intermittent episodes of vomiting x 3 days. Pt notes associated abdominal pain and diarrhea which began in the AM today. 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 Pt denies hematochezia and hematemesis. No alleviating factors noted. Pt denies any other symptoms or complaints at this time.  Past Medical History:  Diagnosis Date  . Hypertension   . Kidney stone     There are no active problems to display for this patient.   Past Surgical History:  Procedure Laterality Date  . ABDOMINAL HYSTERECTOMY    . CHOLECYSTECTOMY    . TUBAL LIGATION      OB History    No data available       Home Medications    Prior to Admission medications   Medication Sig Start Date End Date Taking? Authorizing Provider  acetaminophen (TYLENOL) 500 MG tablet Take 1,000 mg by mouth every 6 (six) hours as needed for moderate pain.    Historical Provider, MD  albuterol (PROVENTIL HFA;VENTOLIN HFA) 108 (90 Base) MCG/ACT inhaler Inhale 2 puffs into the lungs every 6 (six) hours as needed for wheezing or shortness of breath.    Historical  Provider, MD  cyclobenzaprine (FLEXERIL) 10 MG tablet Take 1 tablet (10 mg total) by mouth 2 (two) times daily as needed for muscle spasms. 02/25/16   Elnora Morrison, MD  ibuprofen (ADVIL,MOTRIN) 200 MG tablet Take 400 mg by mouth every 6 (six) hours as needed for moderate pain.    Historical Provider, MD  lisinopril-hydrochlorothiazide (PRINZIDE,ZESTORETIC) 20-12.5 MG per tablet Take 1 tablet by mouth at bedtime.     Historical Provider, MD  naproxen (NAPROSYN) 500 MG tablet Take 1 tablet (500 mg total) by mouth 2 (two) times daily as needed. 02/25/16   Elnora Morrison, MD    Family History History reviewed. No pertinent family history.  Social History Social History  Substance Use Topics  . Smoking status: Former Smoker    Types: Cigarettes    Quit date: 02/07/2012  . Smokeless tobacco: Never Used  . Alcohol use No     Allergies   Patient has no known allergies.   Review of Systems Review of Systems  Constitutional: Negative for appetite change and fatigue.  HENT: Negative for congestion, ear discharge and sinus pressure.   Eyes: Negative for discharge.  Respiratory: Negative for cough.   Cardiovascular: Negative for chest pain.  Gastrointestinal: Positive for abdominal pain, diarrhea, nausea and vomiting.  Genitourinary: Negative for frequency and hematuria.  Musculoskeletal: Negative for back pain.  Skin: Negative for rash.  Neurological: Negative for seizures and headaches.  Psychiatric/Behavioral: Negative for hallucinations.  Physical Exam Updated Vital Signs BP 164/68 (BP Location: Right Arm)   Pulse 71   Temp 98.1 F (36.7 C) (Oral)   Resp 18   Ht 5\' 6"  (1.676 m)   Wt 230 lb (104.3 kg)   SpO2 100%   BMI 37.12 kg/m   Physical Exam  Constitutional: She is oriented to person, place, and time. She appears well-developed.  HENT:  Head: Normocephalic.  Mouth/Throat: Mucous membranes are dry.  Eyes: Conjunctivae and EOM are normal. No scleral icterus.  Neck:  Neck supple. No thyromegaly present.  Cardiovascular: Normal rate and regular rhythm.  Exam reveals no gallop and no friction rub.   No murmur heard. Pulmonary/Chest: No stridor. She has no wheezes. She has no rales. She exhibits no tenderness.  Abdominal: She exhibits no distension. There is tenderness. There is no rebound.  Mild tenderness throughout abdomen.  Musculoskeletal: Normal range of motion. She exhibits no edema.  Lymphadenopathy:    She has no cervical adenopathy.  Neurological: She is oriented to person, place, and time. She exhibits normal muscle tone. Coordination normal.  Skin: No rash noted. No erythema.  Psychiatric: She has a normal mood and affect. Her behavior is normal.  Nursing note and vitals reviewed.  ED Treatments / Results  DIAGNOSTIC STUDIES:  Oxygen Saturation is 100% on RA, normal by my interpretation.    COORDINATION OF CARE:  10:59 AM Discussed treatment plan with pt at bedside and pt agreed to plan.  Labs (all labs ordered are listed, but only abnormal results are displayed) Labs Reviewed  COMPREHENSIVE METABOLIC PANEL - Abnormal; Notable for the following:       Result Value   Glucose, Bld 101 (*)    All other components within normal limits  CBC  URINALYSIS, ROUTINE W REFLEX MICROSCOPIC (NOT AT [facility])    EKG  EKG Interpretation None       Radiology No results found.  Procedures Procedures (including critical care time)  Medications Ordered in ED Medications - No data to display   Initial Impression / Assessment and Plan / ED Course  I have reviewed the triage vital signs and the nursing notes.  Pertinent labs & imaging results that were available during my care of the patient were reviewed by me and considered in my medical decision making (see chart for details).  Clinical Course     Patient with gastroenteritis. Patient will be treated with Zofran and Motrin or Tylenol plenty of fluids and follow-up as needed  Final  Clinical Impressions(s) / ED Diagnoses   Final diagnoses:  None    New Prescriptions New Prescriptions   No medications on file  The chart was scribed for me under my direct supervision.  I personally performed the history, physical, and medical decision making and all procedures in the evaluation of this patient.Milton Ferguson, MD 05/06/16 [phone]

## 2016-05-06 NOTE — Discharge Instructions (Signed)
Drink plenty of fluids take Tylenol or Motrin for pain and follow-up next week if not improving

## 2017-06-23 ENCOUNTER — Other Ambulatory Visit: Payer: Self-pay

## 2017-06-23 ENCOUNTER — Emergency Department (HOSPITAL_COMMUNITY)
Admission: EM | Admit: 2017-06-23 | Discharge: 2017-06-23 | Disposition: A | Discharge status: Home / Self Care | Payer: Self-pay | Attending: Emergency Medicine | Admitting: Emergency Medicine

## 2017-06-23 ENCOUNTER — Encounter (HOSPITAL_COMMUNITY): Payer: Self-pay | Admitting: *Deleted

## 2017-06-23 DIAGNOSIS — R42 Dizziness and giddiness: Secondary | ICD-10-CM | POA: Insufficient documentation

## 2017-06-23 DIAGNOSIS — R197 Diarrhea, unspecified: Secondary | ICD-10-CM | POA: Insufficient documentation

## 2017-06-23 DIAGNOSIS — Z79899 Other long term (current) drug therapy: Secondary | ICD-10-CM | POA: Insufficient documentation

## 2017-06-23 DIAGNOSIS — Z87891 Personal history of nicotine dependence: Secondary | ICD-10-CM | POA: Insufficient documentation

## 2017-06-23 DIAGNOSIS — R112 Nausea with vomiting, unspecified: Secondary | ICD-10-CM | POA: Insufficient documentation

## 2017-06-23 DIAGNOSIS — I10 Essential (primary) hypertension: Secondary | ICD-10-CM | POA: Insufficient documentation

## 2017-06-23 LAB — URINALYSIS, ROUTINE W REFLEX MICROSCOPIC
Bilirubin Urine: NEGATIVE
Glucose, UA: NEGATIVE mg/dL
Hgb urine dipstick: NEGATIVE
KETONES UR: NEGATIVE mg/dL
Leukocytes, UA: NEGATIVE
Nitrite: NEGATIVE
Protein, ur: 30 mg/dL — AB
Specific Gravity, Urine: 1.028 (ref 1.005–1.030)
pH: 5 (ref 5.0–8.0)

## 2017-06-23 LAB — CBC
HCT: 39.2 % (ref 36.0–46.0)
HEMOGLOBIN: 13.3 g/dL (ref 12.0–15.0)
MCH: 29.9 pg (ref 26.0–34.0)
MCHC: 33.9 g/dL (ref 30.0–36.0)
MCV: 88.1 fL (ref 78.0–100.0)
PLATELETS: 265 10*3/uL (ref 150–400)
RBC: 4.45 MIL/uL (ref 3.87–5.11)
RDW: 13.1 % (ref 11.5–15.5)
WBC: 6 10*3/uL (ref 4.0–10.5)

## 2017-06-23 LAB — COMPREHENSIVE METABOLIC PANEL
ALBUMIN: 4.1 g/dL (ref 3.5–5.0)
ALT: 20 U/L (ref 14–54)
AST: 18 U/L (ref 15–41)
Alkaline Phosphatase: 83 U/L (ref 38–126)
Anion gap: 11 (ref 5–15)
BUN: 17 mg/dL (ref 6–20)
CO2: 24 mmol/L (ref 22–32)
CREATININE: 0.87 mg/dL (ref 0.44–1.00)
Calcium: 9.3 mg/dL (ref 8.9–10.3)
Chloride: 101 mmol/L (ref 101–111)
GFR calc non Af Amer: >60 mL/min (ref >60)
GLUCOSE: 113 mg/dL — AB (ref 65–99)
Potassium: 4 mmol/L (ref 3.5–5.1)
SODIUM: 136 mmol/L (ref 135–145)
Total Bilirubin: 0.9 mg/dL (ref 0.3–1.2)
Total Protein: 8.6 g/dL — ABNORMAL HIGH (ref 6.5–8.1)

## 2017-06-23 LAB — LIPASE, BLOOD: Lipase: 32 U/L (ref 11–51)

## 2017-06-23 MED ORDER — ACETAMINOPHEN 325 MG PO TABS
650.0000 mg | ORAL_TABLET | Freq: Once | ORAL | Status: AC
Start: 2017-06-23 — End: 2017-06-23
  Administered 2017-06-23: 650 mg via ORAL
  Filled 2017-06-23: qty 2

## 2017-06-23 MED ORDER — LISINOPRIL 10 MG PO TABS
20.0000 mg | ORAL_TABLET | Freq: Once | ORAL | Status: AC
  Administered 2017-06-23: 20 mg via ORAL
  Filled 2017-06-23: qty 2

## 2017-06-23 MED ORDER — ONDANSETRON HCL 4 MG/2ML IJ SOLN
4.0000 mg | Freq: Once | INTRAMUSCULAR | Status: AC
  Administered 2017-06-23: 4 mg via INTRAVENOUS
  Filled 2017-06-23: qty 2

## 2017-06-23 MED ORDER — SODIUM CHLORIDE 0.9 % IV BOLUS (SEPSIS)
1000.0000 mL | Freq: Once | INTRAVENOUS | Status: AC
  Administered 2017-06-23: 1000 mL via INTRAVENOUS

## 2017-06-23 MED ORDER — HYDROCHLOROTHIAZIDE 12.5 MG PO CAPS
12.5000 mg | ORAL_CAPSULE | Freq: Every day | ORAL | Status: DC
  Administered 2017-06-23: 12.5 mg via ORAL
  Filled 2017-06-23 (×3): qty 1

## 2017-06-23 MED ORDER — ONDANSETRON HCL 4 MG PO TABS: 4.0000 mg | ORAL_TABLET | Freq: Four times a day (QID) | ORAL | 0 refills | Status: DC

## 2017-06-23 NOTE — ED Provider Notes (Signed)
[facility] EMERGENCY DEPARTMENT Provider Note   CSN: [id number] Arrival date & time: 06/23/17  1124     History   Chief Complaint Chief Complaint  Patient presents with  . Nausea    HPI Barbara Snow is a 51 y.o. female.  HPI   Barbara Snow is a 51 y.o. female who presents to the Emergency Department complaining of nausea, vomiting and diarrhea for several days.  She reports multiple episodes of each and unable to tolerate liquids until today.  States that she attempted to go to work today, but felt weak all over and room spinning upon standing.  She was also admits that she hasn't taken her blood pressure medications in 3 days due to the vomiting.  She denies chest pain, shortness of breath, headache, facial or extremity weakness. 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    Past Medical History:  Diagnosis Date  . Hypertension   . Kidney stone     There are no active problems to display for this patient.   Past Surgical History:  Procedure Laterality Date  . ABDOMINAL HYSTERECTOMY    . CHOLECYSTECTOMY    . TUBAL LIGATION      OB History    No data available       Home Medications    Prior to Admission medications   Medication Sig Start Date End Date Taking? Authorizing Provider  acetaminophen (TYLENOL) 500 MG tablet Take 1,000 mg by mouth every 6 (six) hours as needed for moderate pain.    [provider]  albuterol (PROVENTIL HFA;VENTOLIN HFA) 108 (90 Base) MCG/ACT inhaler Inhale 2 puffs into the lungs every 6 (six) hours as needed for wheezing or shortness of breath.    [provider]  cyclobenzaprine (FLEXERIL) 10 MG tablet Take 1 tablet (10 mg total) by mouth 2 (two) times daily as needed for muscle spasms. Patient not taking: Reported on 05/06/2016 02/25/16   Elnora Morrison, MD  ibuprofen (ADVIL,MOTRIN) 200 MG tablet Take 400 mg by mouth every 6 (six) hours as needed for moderate pain.    [provider]    lisinopril-hydrochlorothiazide (PRINZIDE,ZESTORETIC) 20-12.5 MG per tablet Take 1 tablet by mouth at bedtime.     [provider]  naproxen (NAPROSYN) 500 MG tablet Take 1 tablet (500 mg total) by mouth 2 (two) times daily as needed. Patient not taking: Reported on 05/06/2016 02/25/16   Elnora Morrison, MD  ondansetron (ZOFRAN ODT) 4 MG disintegrating tablet 4mg  ODT q4 hours prn nausea/vomit 05/06/16   Milton Ferguson, MD    Family History No family history on file.  Social History Social History   Tobacco Use  . Smoking status: Former Smoker    Types: Cigarettes    Last attempt to quit: 02/07/2012    Years since quitting: 5.3  . Smokeless tobacco: Never Used  Substance Use Topics  . Alcohol use: No  . Drug use: No     Allergies   Patient has no known allergies.   Review of Systems Review of Systems  Constitutional: Negative for appetite change, chills and fever.  HENT: Negative for congestion.   Eyes: Negative for visual disturbance.  Respiratory: Negative for shortness of breath.   Cardiovascular: Negative for chest pain.  Gastrointestinal: Positive for diarrhea, nausea and vomiting. Negative for abdominal pain and blood in stool.  Genitourinary: Negative for decreased urine volume, difficulty urinating, dysuria and flank pain.  Musculoskeletal: Negative for back pain.  Skin:  Negative for color change and rash.  Neurological: Positive for dizziness ("room spinning" ). Negative for syncope, facial asymmetry, speech difficulty, weakness and numbness.  Hematological: Negative for adenopathy.  Psychiatric/Behavioral: Negative for confusion.  All other systems reviewed and are negative.    Physical Exam Updated Vital Signs BP (!) 180/92 (BP Location: Left Wrist)   Pulse 62   Temp 98.8 F (37.1 C) (Oral)   Resp 16   Ht 5\' 6"  (1.676 m)   Wt 102.1 kg (225 lb)   SpO2 100%   BMI 36.32 kg/m   Physical Exam  Constitutional: She is oriented to person, place, and  time. She appears well-developed and well-nourished. No distress.  HENT:  Head: Normocephalic and atraumatic.  Mouth/Throat: Uvula is midline and oropharynx is clear and moist. Mucous membranes are dry.  Neck: Normal range of motion and phonation normal. Neck supple.  Cardiovascular: Normal rate, regular rhythm and intact distal pulses.  No murmur heard. Pulmonary/Chest: Effort normal and breath sounds normal. No respiratory distress.  Abdominal: Soft. Normal appearance and bowel sounds are normal. She exhibits no distension and no mass. There is no tenderness. There is no rebound and no guarding.  Musculoskeletal: Normal range of motion. She exhibits no edema.  Neurological: She is alert and oriented to person, place, and time. No sensory deficit. She exhibits normal muscle tone. Coordination normal. GCS eye subscore is 4. GCS verbal subscore is 5. GCS motor subscore is 6.  CN III-XII grossly intact.  No facial droop, dysarthria, or pronator drift.    Skin: Skin is warm and dry. Capillary refill takes less than 2 seconds. No rash noted.  Psychiatric: She has a normal mood and affect. Thought content normal.  Nursing note and vitals reviewed.    ED Treatments / Results  Labs (all labs ordered are listed, but only abnormal results are displayed) Labs Reviewed  COMPREHENSIVE METABOLIC PANEL - Abnormal; Notable for the following components:      Result Value   Glucose, Bld 113 (*)    Total Protein 8.6 (*)    All other components within normal limits  URINALYSIS, ROUTINE W REFLEX MICROSCOPIC - Abnormal; Notable for the following components:   APPearance CLOUDY (*)    Protein, ur 30 (*)    Bacteria, UA RARE (*)    Squamous Epithelial / LPF 6-30 (*)    All other components within normal limits  LIPASE, BLOOD  CBC    EKG ED ECG REPORT   Date: 06/23/2017  Rate: 62  Rhythm: normal sinus rhythm  QRS Axis: normal  Intervals: normal  ST/T Wave abnormalities: normal  Conduction  Disutrbances:none  Narrative Interpretation:   Old EKG Reviewed: no significant change from 01/2016   EKG reviewed by Dr. Lindajo Royal   Radiology No results found.  Procedures Procedures (including critical care time)  Medications Ordered in ED Medications  hydrochlorothiazide (MICROZIDE) capsule 12.5 mg (12.5 mg Oral Given 06/23/17 1507)  acetaminophen (TYLENOL) tablet 650 mg (not administered)  sodium chloride 0.9 % bolus 1,000 mL (1,000 mLs Intravenous New Bag/Given 06/23/17 1504)  ondansetron (ZOFRAN) injection 4 mg (4 mg Intravenous Given 06/23/17 1504)  lisinopril (PRINIVIL,ZESTRIL) tablet 20 mg (20 mg Oral Given 06/23/17 1507)     Initial Impression / Assessment and Plan / ED Course  I have reviewed the triage vital signs and the nursing notes.  Pertinent labs & imaging results that were available during my care of the patient were reviewed by me and considered in my medical  decision making (see chart for details).    Pt is non-toxic appearing,  mucous membranes are dry.  Labs reassuring, no concerning sx's for acute abdomen, sx's felt to be viral.  Will tx with IVF's, anti emetic and reassess.   Pt is feeling better after IVF's and antiemetic.  Pt was hypertensive on arrival, admits to not taking her BP meds for 3 days due to vomiting.  BP improved here after IVF's and lisinopril/HCTZ.  Now tolerated oral fluids.  No vomiting here, talking with friends at bedside.  Vertiginous symptoms also improved.  No focal neuro deficits.    Pt also seen by Dr. Lacinda Axon and care plan discussed.    Final Clinical Impressions(s) / ED Diagnoses   Final diagnoses:  Nausea vomiting and diarrhea  Essential hypertension    ED Discharge Orders    None       Kem Parkinson, PA-C 06/23/17 1718    Nat Christen, MD 06/26/17 [phone]

## 2017-06-23 NOTE — ED Triage Notes (Signed)
C/o nausea, vomiting  For several days, needs note to return to work

## 2017-06-23 NOTE — Discharge Instructions (Signed)
Frequent, small amts of fluids today, then bland diet as tolerated tomorrow.  It's important to take your BP meds daily as directed.  Follow-up with your primary doctor for recheck or return here for any worsening symptoms.

## 2017-08-26 ENCOUNTER — Encounter (HOSPITAL_COMMUNITY): Payer: Self-pay

## 2017-08-26 ENCOUNTER — Emergency Department (HOSPITAL_COMMUNITY)
Admission: EM | Admit: 2017-08-26 | Discharge: 2017-08-26 | Disposition: A | Discharge status: Home / Self Care | Payer: Self-pay | Attending: Emergency Medicine | Admitting: Emergency Medicine

## 2017-08-26 DIAGNOSIS — Z79899 Other long term (current) drug therapy: Secondary | ICD-10-CM | POA: Insufficient documentation

## 2017-08-26 DIAGNOSIS — M7521 Bicipital tendinitis, right shoulder: Secondary | ICD-10-CM | POA: Insufficient documentation

## 2017-08-26 DIAGNOSIS — M7581 Other shoulder lesions, right shoulder: Secondary | ICD-10-CM

## 2017-08-26 DIAGNOSIS — M150 Primary generalized (osteo)arthritis: Secondary | ICD-10-CM | POA: Insufficient documentation

## 2017-08-26 DIAGNOSIS — F17210 Nicotine dependence, cigarettes, uncomplicated: Secondary | ICD-10-CM | POA: Insufficient documentation

## 2017-08-26 DIAGNOSIS — M778 Other enthesopathies, not elsewhere classified: Secondary | ICD-10-CM

## 2017-08-26 DIAGNOSIS — I10 Essential (primary) hypertension: Secondary | ICD-10-CM | POA: Insufficient documentation

## 2017-08-26 DIAGNOSIS — M159 Polyosteoarthritis, unspecified: Secondary | ICD-10-CM

## 2017-08-26 DIAGNOSIS — G5601 Carpal tunnel syndrome, right upper limb: Secondary | ICD-10-CM | POA: Insufficient documentation

## 2017-08-26 MED ORDER — TRAMADOL HCL 50 MG PO TABS
100.0000 mg | ORAL_TABLET | Freq: Once | ORAL | Status: AC
Start: 2017-08-26 — End: 2017-08-26
  Administered 2017-08-26: 100 mg via ORAL
  Filled 2017-08-26: qty 2

## 2017-08-26 MED ORDER — KETOROLAC TROMETHAMINE 30 MG/ML IJ SOLN
30.0000 mg | Freq: Once | INTRAMUSCULAR | Status: AC
  Administered 2017-08-26: 30 mg via INTRAMUSCULAR
  Filled 2017-08-26: qty 1

## 2017-08-26 MED ORDER — TRAMADOL HCL 50 MG PO TABS: 50.0000 mg | ORAL_TABLET | Freq: Four times a day (QID) | ORAL | 0 refills | Status: DC | PRN

## 2017-08-26 MED ORDER — IBUPROFEN 600 MG PO TABS: 600.0000 mg | ORAL_TABLET | Freq: Four times a day (QID) | ORAL | 0 refills | Status: DC

## 2017-08-26 MED ORDER — DEXAMETHASONE SODIUM PHOSPHATE 10 MG/ML IJ SOLN
10.0000 mg | Freq: Once | INTRAMUSCULAR | Status: AC
  Administered 2017-08-26: 10 mg via INTRAMUSCULAR
  Filled 2017-08-26: qty 1

## 2017-08-26 MED ORDER — PROMETHAZINE HCL 12.5 MG PO TABS
12.5000 mg | ORAL_TABLET | Freq: Once | ORAL | Status: AC
  Administered 2017-08-26: 12.5 mg via ORAL
  Filled 2017-08-26: qty 1

## 2017-08-26 MED ORDER — DEXAMETHASONE 4 MG PO TABS: 4.0000 mg | ORAL_TABLET | Freq: Two times a day (BID) | ORAL | 0 refills | Status: DC

## 2017-08-26 NOTE — ED Notes (Signed)
Pt with shooting pains to right upper arm, tingling in her hand/fingers of right, started 3 days ago.  Pt admits to heavy lifting with client.  Pt admits to hx of tendonitis as well but not this bad.

## 2017-08-26 NOTE — Discharge Instructions (Signed)
Your examination is limited today because of your pain level, but your examination suggests tendinitis involving your shoulder and upper arm, as well as possible carpal tunnel syndrome on the right.  There is also signs of arthritis of your upper extremity.  Please use Decadron 2 times daily with food, use ibuprofen with breakfast, lunch, dinner, and at bedtime.  May use Ultram for more severe pain. This medication may cause drowsiness. Please do not drink, drive, or participate in activity that requires concentration while taking this medication.  Please call Dr. Aline Brochure for orthopedic evaluation/appointment  of your right upper extremity and also evaluation of possible carpal tunnel.

## 2017-08-26 NOTE — ED Triage Notes (Signed)
Pt c/o right arm pain x 3 days. Denies injury. Limited movement, unable to raise arm.

## 2017-08-26 NOTE — ED Provider Notes (Signed)
[facility] EMERGENCY DEPARTMENT Provider Note   CSN: [id number] Arrival date & time: 08/26/17  1440     History   Chief Complaint Chief Complaint  Patient presents with  . Arm Pain    HPI Barbara Snow is a 52 y.o. female.  Patient is a 52 year old female who presents to the emergency department with complaint of right arm pain.  The patient states she has had 3 days of tingling in the fingers and right hand.  She has some shooting pains in this right hand.  It is of note that she does some repetitive motion with the right hand.  She does some heavy lifting.  She has a history of previous tendinitis.  No previous operations or procedures involving the right hand.  No injury to the right arm.  No injury to the head or to the neck recently.      Past Medical History:  Diagnosis Date  . Hypertension   . Kidney stone     There are no active problems to display for this patient.   Past Surgical History:  Procedure Laterality Date  . ABDOMINAL HYSTERECTOMY    . CHOLECYSTECTOMY    . TUBAL LIGATION      OB History    No data available       Home Medications    Prior to Admission medications   Medication Sig Start Date End Date Taking? Authorizing Provider  acetaminophen (TYLENOL) 500 MG tablet Take 1,000 mg by mouth every 6 (six) hours as needed for moderate pain.    [provider]  albuterol (PROVENTIL HFA;VENTOLIN HFA) 108 (90 Base) MCG/ACT inhaler Inhale 2 puffs into the lungs every 6 (six) hours as needed for wheezing or shortness of breath.    [provider]  cyclobenzaprine (FLEXERIL) 10 MG tablet Take 1 tablet (10 mg total) by mouth 2 (two) times daily as needed for muscle spasms. Patient not taking: Reported on 05/06/2016 02/25/16   Elnora Morrison, MD  ibuprofen (ADVIL,MOTRIN) 200 MG tablet Take 400 mg by mouth every 6 (six) hours as needed for moderate pain.    [provider]  lisinopril-hydrochlorothiazide (PRINZIDE,ZESTORETIC)  20-12.5 MG per tablet Take 1 tablet by mouth at bedtime.     [provider]  naproxen (NAPROSYN) 500 MG tablet Take 1 tablet (500 mg total) by mouth 2 (two) times daily as needed. Patient not taking: Reported on 05/06/2016 02/25/16   Elnora Morrison, MD  ondansetron (ZOFRAN ODT) 4 MG disintegrating tablet 4mg  ODT q4 hours prn nausea/vomit 05/06/16   Milton Ferguson, MD  ondansetron (ZOFRAN) 4 MG tablet Take 1 tablet (4 mg total) by mouth every 6 (six) hours. 06/23/17   Kem Parkinson, PA-C    Family History No family history on file.  Social History Social History   Tobacco Use  . Smoking status: Current Some Day Smoker    Types: Cigarettes    Last attempt to quit: 02/07/2012    Years since quitting: 5.5  . Smokeless tobacco: Never Used  . Tobacco comment: 3 cigarettes a week  Substance Use Topics  . Alcohol use: No  . Drug use: No     Allergies   Patient has no known allergies.   Review of Systems Review of Systems  Constitutional: Negative for activity change.       All ROS Neg except as noted in HPI  HENT: Negative for nosebleeds.   Eyes: Negative for photophobia and discharge.  Respiratory: Negative for cough, shortness of  breath and wheezing.   Cardiovascular: Negative for chest pain and palpitations.  Gastrointestinal: Negative for abdominal pain and blood in stool.  Genitourinary: Negative for dysuria, frequency and hematuria.  Musculoskeletal: Positive for arthralgias. Negative for back pain and neck pain.  Skin: Negative.   Neurological: Positive for numbness. Negative for dizziness, seizures and speech difficulty.       Tingling  Psychiatric/Behavioral: Negative for confusion and hallucinations.     Physical Exam Updated Vital Signs BP (!) 162/88 (BP Location: Left Arm)   Pulse 71   Temp 98.8 F (37.1 C) (Oral)   Resp 18   Ht 5\' 7"  (1.702 m)   Wt 104.3 kg (230 lb)   SpO2 100%   BMI 36.02 kg/m   Physical Exam  Constitutional: She is oriented  to person, place, and time. She appears well-developed and well-nourished.  Non-toxic appearance.  HENT:  Head: Normocephalic.  Right Ear: Tympanic membrane and external ear normal.  Left Ear: Tympanic membrane and external ear normal.  Eyes: EOM and lids are normal. Pupils are equal, round, and reactive to light.  Neck: Normal range of motion. Neck supple. Carotid bruit is not present.  No carotid bruits appreciated.  Cardiovascular: Normal rate, regular rhythm, normal heart sounds, intact distal pulses and normal pulses.  Pulmonary/Chest: Breath sounds normal. No respiratory distress.  Abdominal: Soft. Bowel sounds are normal. There is no tenderness. There is no guarding.  Musculoskeletal: Normal range of motion.  There is tightness and tenseness of the upper trapezius on the right.  There is no tenderness or deformity of the clavicle.  There is no dislocation or deformity of the scapula on the right.  There is no hot areas noted of the right shoulder.  There is pain of the upper bicep tricep area along the humeral area.  The patient would not cooperate for range of motion exercises.  And I cannot assess crepitus.  There is some soreness of the olecranon process of the elbow.  There is no hot areas appreciated and limited examination does not suggest effusion involving the elbow.  There is no deformity or hematoma or other abnormality of the forearm.  However there is pain from the hand to the forearm area with flexion and extension of the wrist.  There is no pain in the anatomical snuffbox.  There is full range of motion of the fingers, however there is great deal of pain with flexion of the fingers of the right hand that extends from the fingers up to the elbow forearm area.  The capillary refill is less than 2 seconds.  Radial pulses are 2+ and symmetrical.  There are degenerative changes noted of the MP areas of the right and left hand.  No temperature changes noted of the right compared to the  left upper extremity.  Lymphadenopathy:       Head (right side): No submandibular adenopathy present.       Head (left side): No submandibular adenopathy present.    She has no cervical adenopathy.  Neurological: She is alert and oriented to person, place, and time. She has normal strength. No cranial nerve deficit or sensory deficit.  Grip is intact.  Patient would not cooperate for Tinel's testing or other carpal tunnel testing.  There is no atrophy of the thenar eminence.  Skin: Skin is warm and dry.  Psychiatric: She has a normal mood and affect. Her speech is normal.  Nursing note and vitals reviewed.    ED Treatments / Results  Labs (all labs ordered are listed, but only abnormal results are displayed) Labs Reviewed - No data to display  EKG  EKG Interpretation None       Radiology No results found.  Procedures Procedures (including critical care time)  Medications Ordered in ED Medications - No data to display   Initial Impression / Assessment and Plan / ED Course  I have reviewed the triage vital signs and the nursing notes.  Pertinent labs & imaging results that were available during my care of the patient were reviewed by me and considered in my medical decision making (see chart for details).       Final Clinical Impressions(s) / ED Diagnoses MDM  Blood pressure is elevated at 162/88, otherwise vital signs within normal limits.  No gross deformity noted of the shoulder or the right upper extremity.  No neurovascular deficit appreciated at this time.  No hot joints appreciated.  Doubt septic joint.  No evidence of deformity or dislocation of the right upper extremity.  Patient has a history of carpal tunnel, and does a lot of repetitive motion.  She has some tingling of her little finger ring finger and part of her long finger on the right hand.  I am unable to do a detailed carpal tunnel examination due to the patient's pain.  The patient will be placed in  a wrist splint until she can be seen by orthopedics for additional evaluation of carpal tunnel.  There are no hot joints noted about the shoulder.  There is no deformity noted of the bicep tricep area.  There is pain to palpation, and there is pain to what limited movement the patient will cooperate with.  I suspect that the patient has a tendinitis, and/or arthritis involving the right shoulder and upper extremity.  The patient will be treated with oral steroids, anti-inflammatory pain medication, and Ultram for more severe pain.  She is referred to orthopedics for additional evaluation.  I have given the patient a sling to use as well as a wrist splint to use until she is seen by orthopedics.  Questions were answered.  The patient is in agreement with this plan.   Final diagnoses:  Tendonitis of shoulder, right  Carpal tunnel syndrome on right  Primary osteoarthritis involving multiple joints    ED Discharge Orders        Ordered    traMADol (ULTRAM) 50 MG tablet  Every 6 hours PRN     08/26/17 1546    dexamethasone (DECADRON) 4 MG tablet  2 times daily with meals     08/26/17 1546    ibuprofen (ADVIL,MOTRIN) 600 MG tablet  4 times daily     08/26/17 1546       Lily Kocher, PA-C 08/26/17 1556    Milton Ferguson, MD 08/27/17 0830

## 2017-10-27 DIAGNOSIS — I639 Cerebral infarction, unspecified: Secondary | ICD-10-CM

## 2017-10-27 HISTORY — DX: Cerebral infarction, unspecified: I63.9

## 2017-11-05 ENCOUNTER — Other Ambulatory Visit: Payer: Self-pay

## 2017-11-05 ENCOUNTER — Encounter (HOSPITAL_COMMUNITY): Payer: Self-pay

## 2017-11-05 ENCOUNTER — Observation Stay (HOSPITAL_COMMUNITY)
Admission: EM | Admit: 2017-11-05 | Discharge: 2017-11-08 | Disposition: A | Discharge status: Home / Self Care | Payer: Self-pay | Attending: Internal Medicine | Admitting: Internal Medicine

## 2017-11-05 DIAGNOSIS — Z79899 Other long term (current) drug therapy: Secondary | ICD-10-CM | POA: Insufficient documentation

## 2017-11-05 DIAGNOSIS — R2990 Unspecified symptoms and signs involving the nervous system: Secondary | ICD-10-CM

## 2017-11-05 DIAGNOSIS — F17210 Nicotine dependence, cigarettes, uncomplicated: Secondary | ICD-10-CM | POA: Insufficient documentation

## 2017-11-05 DIAGNOSIS — Z720 Tobacco use: Secondary | ICD-10-CM

## 2017-11-05 DIAGNOSIS — G43519 Persistent migraine aura without cerebral infarction, intractable, without status migrainosus: Secondary | ICD-10-CM

## 2017-11-05 DIAGNOSIS — I10 Essential (primary) hypertension: Secondary | ICD-10-CM | POA: Insufficient documentation

## 2017-11-05 DIAGNOSIS — G459 Transient cerebral ischemic attack, unspecified: Principal | ICD-10-CM | POA: Insufficient documentation

## 2017-11-05 DIAGNOSIS — J449 Chronic obstructive pulmonary disease, unspecified: Secondary | ICD-10-CM | POA: Insufficient documentation

## 2017-11-05 HISTORY — DX: Chronic obstructive pulmonary disease, unspecified: J44.9

## 2017-11-05 NOTE — ED Triage Notes (Signed)
Pt reports right sided numbness and tingling that started the end of February when she was seen here and diagnosed with tendonitis.  Pt reports these episode are intermittent, but today have persisted, so she got worried. Pt sig other reports pt had right sided facial droop earlier tonight, however not present at this time. Pt ambulatory to room, alert and oriented x 4. Speech is normal.

## 2017-11-05 NOTE — ED Notes (Signed)
Pt complaining of headache  

## 2017-11-05 NOTE — ED Notes (Signed)
Patient ambulated to bathroom with tingling in right leg

## 2017-11-06 ENCOUNTER — Observation Stay (HOSPITAL_BASED_OUTPATIENT_CLINIC_OR_DEPARTMENT_OTHER): Payer: Self-pay

## 2017-11-06 ENCOUNTER — Observation Stay (HOSPITAL_COMMUNITY): Payer: Self-pay

## 2017-11-06 ENCOUNTER — Encounter (HOSPITAL_COMMUNITY): Payer: Self-pay | Admitting: Internal Medicine

## 2017-11-06 ENCOUNTER — Emergency Department (HOSPITAL_COMMUNITY): Payer: Self-pay

## 2017-11-06 DIAGNOSIS — G459 Transient cerebral ischemic attack, unspecified: Secondary | ICD-10-CM

## 2017-11-06 DIAGNOSIS — Z720 Tobacco use: Secondary | ICD-10-CM

## 2017-11-06 DIAGNOSIS — R2990 Unspecified symptoms and signs involving the nervous system: Secondary | ICD-10-CM

## 2017-11-06 DIAGNOSIS — J449 Chronic obstructive pulmonary disease, unspecified: Secondary | ICD-10-CM | POA: Diagnosis present

## 2017-11-06 DIAGNOSIS — I10 Essential (primary) hypertension: Secondary | ICD-10-CM | POA: Diagnosis present

## 2017-11-06 LAB — RAPID URINE DRUG SCREEN, HOSP PERFORMED
AMPHETAMINES: NOT DETECTED
BARBITURATES: NOT DETECTED
BENZODIAZEPINES: NOT DETECTED
Cocaine: NOT DETECTED
Opiates: NOT DETECTED
TETRAHYDROCANNABINOL: NOT DETECTED

## 2017-11-06 LAB — LIPID PANEL
CHOL/HDL RATIO: 3.9 ratio
CHOLESTEROL: 157 mg/dL (ref 0–200)
HDL: 40 mg/dL — ABNORMAL LOW (ref >40)
LDL Cholesterol: 97 mg/dL (ref 0–99)
Triglycerides: 100 mg/dL (ref <150)
VLDL: 20 mg/dL (ref 0–40)

## 2017-11-06 LAB — ETHANOL: Alcohol, Ethyl (B): 31 mg/dL — ABNORMAL HIGH (ref <10)

## 2017-11-06 LAB — COMPREHENSIVE METABOLIC PANEL
ALT: 25 U/L (ref 14–54)
ANION GAP: 8 (ref 5–15)
AST: 22 U/L (ref 15–41)
Albumin: 4 g/dL (ref 3.5–5.0)
Alkaline Phosphatase: 72 U/L (ref 38–126)
BUN: 12 mg/dL (ref 6–20)
CO2: 27 mmol/L (ref 22–32)
Calcium: 9.9 mg/dL (ref 8.9–10.3)
Chloride: 104 mmol/L (ref 101–111)
Creatinine, Ser: 0.75 mg/dL (ref 0.44–1.00)
GFR calc Af Amer: >60 mL/min (ref >60)
GFR calc non Af Amer: >60 mL/min (ref >60)
Glucose, Bld: 94 mg/dL (ref 65–99)
POTASSIUM: 3.6 mmol/L (ref 3.5–5.1)
SODIUM: 139 mmol/L (ref 135–145)
Total Bilirubin: 0.7 mg/dL (ref 0.3–1.2)
Total Protein: 8.3 g/dL — ABNORMAL HIGH (ref 6.5–8.1)

## 2017-11-06 LAB — TSH: TSH: 9.016 u[IU]/mL — ABNORMAL HIGH (ref 0.350–4.500)

## 2017-11-06 LAB — ECHOCARDIOGRAM COMPLETE
HEIGHTINCHES: 66 in
Weight: 3680 oz

## 2017-11-06 LAB — CBC
HCT: 36.1 % (ref 36.0–46.0)
Hemoglobin: 12.2 g/dL (ref 12.0–15.0)
MCH: 29.8 pg (ref 26.0–34.0)
MCHC: 33.8 g/dL (ref 30.0–36.0)
MCV: 88 fL (ref 78.0–100.0)
PLATELETS: 234 10*3/uL (ref 150–400)
RBC: 4.1 MIL/uL (ref 3.87–5.11)
RDW: 13.4 % (ref 11.5–15.5)
WBC: 6.4 10*3/uL (ref 4.0–10.5)

## 2017-11-06 LAB — VITAMIN B12: Vitamin B-12: 348 pg/mL (ref 180–914)

## 2017-11-06 LAB — DIFFERENTIAL
BASOS ABS: 0 10*3/uL (ref 0.0–0.1)
Basophils Relative: 1 %
EOS ABS: 0.2 10*3/uL (ref 0.0–0.7)
EOS PCT: 3 %
LYMPHS PCT: 45 %
Lymphs Abs: 3 10*3/uL (ref 0.7–4.0)
Monocytes Absolute: 0.5 10*3/uL (ref 0.1–1.0)
Monocytes Relative: 8 %
NEUTROS PCT: 43 %
Neutro Abs: 2.8 10*3/uL (ref 1.7–7.7)

## 2017-11-06 LAB — URINALYSIS, ROUTINE W REFLEX MICROSCOPIC
BILIRUBIN URINE: NEGATIVE
Glucose, UA: NEGATIVE mg/dL
Hgb urine dipstick: NEGATIVE
Ketones, ur: NEGATIVE mg/dL
Leukocytes, UA: NEGATIVE
Nitrite: NEGATIVE
PROTEIN: NEGATIVE mg/dL
Specific Gravity, Urine: 1.002 — ABNORMAL LOW (ref 1.005–1.030)
pH: 6 (ref 5.0–8.0)

## 2017-11-06 LAB — PROTIME-INR
INR: 1
PROTHROMBIN TIME: 13.1 s (ref 11.4–15.2)

## 2017-11-06 LAB — HEMOGLOBIN A1C
HEMOGLOBIN A1C: 5 % (ref 4.8–5.6)
MEAN PLASMA GLUCOSE: 96.8 mg/dL

## 2017-11-06 LAB — TROPONIN I

## 2017-11-06 LAB — APTT: APTT: 27 s (ref 24–36)

## 2017-11-06 MED ORDER — ONDANSETRON HCL 4 MG PO TABS: 4.0000 mg | ORAL_TABLET | Freq: Four times a day (QID) | ORAL | Status: DC | PRN

## 2017-11-06 MED ORDER — LORAZEPAM 2 MG/ML IJ SOLN
1.0000 mg | Freq: Once | INTRAMUSCULAR | Status: AC
  Administered 2017-11-07: 1 mg via INTRAVENOUS
  Filled 2017-11-06: qty 1

## 2017-11-06 MED ORDER — ENOXAPARIN SODIUM 60 MG/0.6ML ~~LOC~~ SOLN
0.5000 mg/kg | SUBCUTANEOUS | Status: DC
  Administered 2017-11-06 – 2017-11-08 (×3): 50 mg via SUBCUTANEOUS
  Filled 2017-11-06 (×2): qty 0.5
  Filled 2017-11-06: qty 0.6

## 2017-11-06 MED ORDER — STROKE: EARLY STAGES OF RECOVERY BOOK
Freq: Once | Status: DC
Start: 2017-11-06 — End: 2017-11-08
  Filled 2017-11-06: qty 1

## 2017-11-06 MED ORDER — ASPIRIN 81 MG PO CHEW
324.0000 mg | CHEWABLE_TABLET | Freq: Once | ORAL | Status: AC
  Administered 2017-11-06: 324 mg via ORAL
  Filled 2017-11-06: qty 4

## 2017-11-06 MED ORDER — ONDANSETRON HCL 4 MG/2ML IJ SOLN: 4.0000 mg | Freq: Four times a day (QID) | INTRAMUSCULAR | Status: DC | PRN

## 2017-11-06 MED ORDER — ENOXAPARIN SODIUM 40 MG/0.4ML ~~LOC~~ SOLN: 40.0000 mg | SUBCUTANEOUS | Status: DC

## 2017-11-06 MED ORDER — ACETAMINOPHEN 325 MG PO TABS
650.0000 mg | ORAL_TABLET | Freq: Once | ORAL | Status: AC
  Administered 2017-11-06: 650 mg via ORAL
  Filled 2017-11-06: qty 2

## 2017-11-06 MED ORDER — ATORVASTATIN CALCIUM 40 MG PO TABS
40.0000 mg | ORAL_TABLET | Freq: Every day | ORAL | Status: DC
  Administered 2017-11-07: 40 mg via ORAL
  Filled 2017-11-06: qty 1

## 2017-11-06 MED ORDER — ACETAMINOPHEN 650 MG RE SUPP: 650.0000 mg | RECTAL | Status: DC | PRN

## 2017-11-06 MED ORDER — ACETAMINOPHEN 325 MG PO TABS
650.0000 mg | ORAL_TABLET | ORAL | Status: DC | PRN
  Administered 2017-11-06 – 2017-11-07 (×2): 650 mg via ORAL
  Filled 2017-11-06 (×2): qty 2

## 2017-11-06 MED ORDER — ACETAMINOPHEN 160 MG/5ML PO SOLN: 650.0000 mg | ORAL | Status: DC | PRN

## 2017-11-06 MED ORDER — ZOLPIDEM TARTRATE 5 MG PO TABS
5.0000 mg | ORAL_TABLET | Freq: Every evening | ORAL | Status: DC | PRN
  Administered 2017-11-06 – 2017-11-07 (×2): 5 mg via ORAL
  Filled 2017-11-06 (×2): qty 1

## 2017-11-06 NOTE — ED Notes (Signed)
Patient transported to CT 

## 2017-11-06 NOTE — Therapy (Signed)
Pt received in room with significant other at bedside and was agreeable to PT eval. Pt from home with significant other in first-level apartment with 1 STE. PLOF independent with all, still driving, no AD and amb unlimited community distances. Currently pt demo'ing deficits in RLE strength AEB dragging her RLE during gait with HHA; able to correct with cueing. She was able to amb 21ft with HHA, slowed, labored cadence and min to no unsteadiness. PT recommending HHPT once ready for d/c due to deficits in strength, balance, and overall gait. Pt and significant other agreeable to this plan.   Geraldine Solar PT, DPT

## 2017-11-06 NOTE — ED Notes (Signed)
Meal tray given 

## 2017-11-06 NOTE — Progress Notes (Signed)
Patient admitted to the hospital earlier this morning by Dr. Olevia Bowens  Patient seen and examined. She continues to complain of numbness and tingling on right face and entire right side of body.  She says she has a headache on the right side of her head. She also feels weak on that side. On exam she appears to have global weakness, but also seems to have poor effort with exam.  She says she normally ambulate independently.  Patient presents with acute right sided numbness and tingling. Etiologies include TIA/CVA vs. Complicated migraine. She was noted to be hypertensive on arrival with sbp >200. Plan is to send patient to [facility] for further work up with MRI brain and neurology evaluation. Will add B12 and TSH to labs drawn.  [employer]

## 2017-11-06 NOTE — ED Provider Notes (Signed)
[facility] EMERGENCY DEPARTMENT Provider Note   CSN: [id number] Arrival date & time: 11/05/17  2159     History   Chief Complaint Chief Complaint  Patient presents with  . Numbness    right sided    HPI Barbara Snow is a 52 y.o. female.  The history is provided by the patient and a significant other.  Neurologic Problem  This is a chronic problem. The current episode started more than 1 week ago. The problem occurs constantly. The problem has been gradually worsening. Associated symptoms include headaches. Pertinent negatives include no chest pain and no abdominal pain. Nothing aggravates the symptoms. Nothing relieves the symptoms.   She reports she has had right facial numbness, right arm numbness, right leg numbness since February.  She reports today it seemed to be worsening at around 9am.  Significant other reports that her face appeared weak, but that is improved.  Patient reports she has  symptoms 24 hours a day since February.  She reports mild headache.  No acute visual changes.  No fever.  No new neck or back pain.  No syncope.  She reports a history of high blood pressure.  Past Medical History:  Diagnosis Date  . Hypertension   . Kidney stone     There are no active problems to display for this patient.   Past Surgical History:  Procedure Laterality Date  . ABDOMINAL HYSTERECTOMY    . CHOLECYSTECTOMY    . TUBAL LIGATION       OB History   None      Home Medications    Prior to Admission medications   Medication Sig Start Date End Date Taking? Authorizing Provider  acetaminophen (TYLENOL) 500 MG tablet Take 1,000 mg by mouth every 6 (six) hours as needed for moderate pain.    [provider]  albuterol (PROVENTIL HFA;VENTOLIN HFA) 108 (90 Base) MCG/ACT inhaler Inhale 2 puffs into the lungs every 6 (six) hours as needed for wheezing or shortness of breath.    [provider]  cyclobenzaprine (FLEXERIL) 10 MG tablet Take 1 tablet (10  mg total) by mouth 2 (two) times daily as needed for muscle spasms. Patient not taking: Reported on 05/06/2016 02/25/16   Elnora Morrison, MD  dexamethasone (DECADRON) 4 MG tablet Take 1 tablet (4 mg total) by mouth 2 (two) times daily with a meal. 08/26/17   Lily Kocher, PA-C  ibuprofen (ADVIL,MOTRIN) 600 MG tablet Take 1 tablet (600 mg total) by mouth 4 (four) times daily. 08/26/17   Lily Kocher, PA-C  lisinopril-hydrochlorothiazide (PRINZIDE,ZESTORETIC) 20-12.5 MG per tablet Take 1 tablet by mouth at bedtime.     [provider]  naproxen (NAPROSYN) 500 MG tablet Take 1 tablet (500 mg total) by mouth 2 (two) times daily as needed. Patient not taking: Reported on 05/06/2016 02/25/16   Elnora Morrison, MD  ondansetron (ZOFRAN ODT) 4 MG disintegrating tablet 4mg  ODT q4 hours prn nausea/vomit 05/06/16   Milton Ferguson, MD  ondansetron (ZOFRAN) 4 MG tablet Take 1 tablet (4 mg total) by mouth every 6 (six) hours. 06/23/17   Triplett, Tammy, PA-C  traMADol (ULTRAM) 50 MG tablet Take 1 tablet (50 mg total) by mouth every 6 (six) hours as needed. 08/26/17   Lily Kocher, PA-C    Family History No family history on file.  Social History Social History   Tobacco Use  . Smoking status: Current Some Day Smoker    Packs/day: 0.00    Types: Cigarettes  Last attempt to quit: 02/07/2012    Years since quitting: 5.7  . Smokeless tobacco: Never Used  . Tobacco comment: 3 cigarettes a week  Substance Use Topics  . Alcohol use: Yes    Comment: occ  . Drug use: No     Allergies   Patient has no known allergies.   Review of Systems Review of Systems  Constitutional: Negative for fever.  Eyes:       No acute visual changes  Cardiovascular: Negative for chest pain.  Gastrointestinal: Negative for abdominal pain.  Musculoskeletal: Negative for back pain and neck pain.  Neurological: Positive for weakness, numbness and headaches. Negative for syncope and speech difficulty.  All other  systems reviewed and are negative.    Physical Exam Updated Vital Signs BP (!) 173/108   Pulse 60   Temp 98.4 F (36.9 C) (Oral)   Resp 18   Ht 1.676 m (5\' 6" )   Wt 104.3 kg (230 lb)   SpO2 100%   BMI 37.12 kg/m   Physical Exam CONSTITUTIONAL: Well developed/well nourished HEAD: Normocephalic/atraumatic EYES: EOMI/PERRL, no nystagmus,  no ptosis ENMT: Mucous membranes moist NECK: supple no meningeal signs, no bruits CV: S1/S2 noted, no murmurs/rubs/gallops noted LUNGS: Lungs are clear to auscultation bilaterally, no apparent distress ABDOMEN: soft, nontender, no rebound or guarding GU:no cva tenderness NEURO:Awake/alert, face symmetric, no arm or leg drift is noted Equal 5/5 strength with shoulder abduction, elbow flex/extension, wrist flex/extension in upper extremities  Equal 5/5 strength with hip flexion,knee flex/extension Cranial nerves 3/4/5/6/01/04/09/11/12 tested and intact No past pointing She reports numbness to right face, right arm, right thigh EXTREMITIES: pulses normalx4, full ROM SKIN: warm, color normal PSYCH: no abnormalities of mood noted    ED Treatments / Results  Labs (all labs ordered are listed, but only abnormal results are displayed) Labs Reviewed  ETHANOL - Abnormal; Notable for the following components:      Result Value   Alcohol, Ethyl (B) 31 (*)    All other components within normal limits  COMPREHENSIVE METABOLIC PANEL - Abnormal; Notable for the following components:   Total Protein 8.3 (*)    All other components within normal limits  URINALYSIS, ROUTINE W REFLEX MICROSCOPIC - Abnormal; Notable for the following components:   Color, Urine COLORLESS (*)    Specific Gravity, Urine 1.002 (*)    All other components within normal limits  PROTIME-INR  APTT  CBC  DIFFERENTIAL  RAPID URINE DRUG SCREEN, HOSP PERFORMED  TROPONIN I    EKG EKG Interpretation  Date/Time:  Friday Nov 05 2017 22:09:58 EDT Ventricular Rate:  68 PR  Interval:    QRS Duration: 101 QT Interval:  414 QTC Calculation: 441 R Axis:   66 Text Interpretation:  Sinus rhythm Probable left atrial enlargement No significant change since last tracing Confirmed by Ripley Fraise [phone]) on 11/06/2017 12:32:08 AM   Radiology Ct Head Wo Contrast  Result Date: 11/06/2017 CLINICAL DATA:  Right-sided numbness and tingling starting the end of February. EXAM: CT HEAD WITHOUT CONTRAST TECHNIQUE: Contiguous axial images were obtained from the base of the skull through the vertex without intravenous contrast. COMPARISON:  07/14/2011 FINDINGS: Brain: No acute intracranial hemorrhage, midline shift or edema. Chronic left caudate head and a ticket are nucleus lacunar infarct. Minimal small vessel ischemic disease of periventricular white matter, chronic in appearance. No large vascular territory infarction, intra-axial mass nor extra-axial fluid collections. Chronic mild asymmetry of the lateral ventricles without evidence of hydrocephalus. Midline fourth ventricle  and basal cisterns. Vascular: No hyperdense vessel sign. Skull: No acute skull fracture or suspicious osseous lesions. Sinuses/Orbits: Stable calcification in the right maxillary sinus possibly small osteoma or calcified polyp incompletely included. Intact orbits and globes. Other: None IMPRESSION: No acute intracranial abnormality. Chronic minimal small vessel ischemic disease. 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 Electronically Signed   By: Ashley Royalty M.D.   On: 11/06/2017 02:00    Procedures Procedures   Medications Ordered in ED Medications  acetaminophen (TYLENOL) tablet 650 mg (650 mg Oral Given 11/06/17 0104)  aspirin chewable tablet 324 mg (324 mg Oral Given 11/06/17 0243)     Initial Impression / Assessment and Plan / ED Course  I have reviewed the triage vital signs and the nursing notes.  Pertinent labs & imaging results that were available during my care  of the patient were reviewed by me and considered in my medical decision making (see chart for details).     12:38 AM She reports numbness in her face arm and leg since February.  She reports it occurs every day.  She reports felt slightly worse in her leg today.  Significant other stated that her face looked weak, but that is not present at this time.  She is a very poor historian, she reports that when she was then seen in the emergency department February she had the same symptoms.  At that time the documentation only reveals right arm numbness and was diagnosed with tendinitis. We will initiate stroke work-up. It is not an acute stroke Patient was ambulatory.  Her speech is at baseline tPA in stroke considered but not given due to: Onset over 3-4.5hours   3:18 AM CT head is abnormal.  Reveal signs of a left-sided lacunar stroke.  This could explain her numbness. Also reported right facial weakness and right leg weakness earlier that is improved.  This could represent worsening of her stroke. I discussed the case with [facility] hospitalist dr Olevia Bowens for admission.  I will also discussed the case with tele-neurology Final Clinical Impressions(s) / ED Diagnoses   Final diagnoses:  Stroke-like symptom    ED Discharge Orders    None       Ripley Fraise, MD 11/06/17 [phone]

## 2017-11-06 NOTE — ED Notes (Signed)
Neuro assessment done.  Pt states her right side of her face, arm and leg are still numb and tingling.  Pt states she is the same as when she came to the ED.

## 2017-11-06 NOTE — ED Notes (Signed)
Called [facility] for transport to [facility]. 

## 2017-11-06 NOTE — Progress Notes (Addendum)
Patient came to ct department with 2 necklaces on, 5 earrings in each each, nose piercing. Patient asked to remove items.  Placed items in bag and was returned with patient to ER. Patient had belongings when she arrived back in her room in the ER.  Patient also had about 40 bobbie pens in hair holding weave and hairbunt asked to remove for scan also placed in baggie and returned with her to ER.

## 2017-11-06 NOTE — ED Notes (Signed)
teleneuro cart to bedside.

## 2017-11-06 NOTE — ED Notes (Signed)
Report given to [facility]. 

## 2017-11-06 NOTE — H&P (Signed)
4       History and Physical    Barbara Snow IRS:[id number] DOB: [birth year] DOA: 11/05/2017  PCP: System, Provider Not In   Patient coming from: Home.  I have personally briefly reviewed patient's old medical records in [place]  Chief Complaint: Worsening facial and right-sided extremities numbness.  HPI: Barbara Snow is a 52 y.o. female with medical history significant of COPD, hypertension, urolithiasis who is coming to the emergency department due to worsening facial and right side extremities numbness that she has had intermittently since February.  However, she mentions that since 0900 yesterday, her symptoms have persisted.  Her significant other noticed that she was having facial droop on the right side earlier, but not initially seen in the ED. She also reports that she was having trouble walking it was so pronounced that her sister asked her to seek medical help.  No blurred vision, slurred speech, word finding difficulty, dizziness, nausea or emesis.  No fever, chills, sore throat, dyspnea, chest pain, palpitations, diaphoresis, PND orthopnea.  She occasionally gets pitting edema of the lower extremities.  No abdominal pain, diarrhea, melena or hematochezia.  No dysuria, frequency or hematuria.  Denies heat or cold intolerance.  No polyuria, polydipsia or blurred vision.  ED Course: ICU/initial vital signs in the ED temperature 98.4 F, pulse 68, respirations 21, blood pressure 216/114 mmHg and O2 sat 100% on room air.  He received aspirin 324 mg and Tylenol 650 mg p.o. x1 dose each.  Her work-up shows normal urinalysis.  Normal urine toxicology.  Normal CBC.  Normal PT/INR/APTT.  Normal troponin.  Unchanged EKG, which only shows possible LA enlargement.  CMP was normal, except for a total protein of 8.3 g/dL.  Alcohol level was 31 mg/dL.  Imaging: A 2 view chest radiograph did not show any active cardiopulmonary disease.  CT brain without contrast did not show any acute  intracranial normality.  However it showed chronic minimal small vessel ischemic disease.  Chronic left cow the anterior left lenticular nucleus lacunar infarcts.  Review of Systems: As per HPI otherwise 10 point review of systems negative.   Past Medical History:  Diagnosis Date  . COPD (chronic obstructive pulmonary disease) ([place])   . Hypertension   . Kidney stone     Past Surgical History:  Procedure Laterality Date  . ABDOMINAL HYSTERECTOMY    . CHOLECYSTECTOMY    . TUBAL LIGATION       reports that she has been smoking cigarettes.  She has been smoking about 0.00 packs per day. She has never used smokeless tobacco. She reports that she drinks alcohol. She reports that she does not use drugs.  No Known Allergies  Family History  Problem Relation Age of Onset  . Diabetes Mother   . Heart attack Mother   . Heart attack Father   . Liver disease Father   . Hypertension Daughter   . Hypertension Daughter     Prior to Admission medications   Medication Sig Start Date End Date Taking? Authorizing Provider  acetaminophen (TYLENOL) 500 MG tablet Take 1,000 mg by mouth every 6 (six) hours as needed for moderate pain.    [provider]  albuterol (PROVENTIL HFA;VENTOLIN HFA) 108 (90 Base) MCG/ACT inhaler Inhale 2 puffs into the lungs every 6 (six) hours as needed for wheezing or shortness of breath.    [provider]  lisinopril-hydrochlorothiazide (PRINZIDE,ZESTORETIC) 20-12.5 MG per tablet Take 1 tablet by mouth at bedtime.  [provider]    Physical Exam: Vitals:   11/06/17 0130 11/06/17 0145 11/06/17 0230 11/06/17 0300  BP: (!) 157/77  (!) 173/87 (!) 161/76  Pulse: (!) 57 (!) 57 (!) 56 (!) 58  Resp: 20 19 15 19   Temp:      TempSrc:      SpO2: 98% 98% 100% 99%  Weight:      Height:        Constitutional: NAD, calm, comfortable Eyes: PERRL, lids and conjunctivae normal ENMT: Mucous membranes are moist. Posterior pharynx clear of any  exudate or lesions. Neck: normal, supple, no masses, no thyromegaly Respiratory: clear to auscultation bilaterally, no wheezing, no crackles. Normal respiratory effort. No accessory muscle use.  Cardiovascular: Regular rate and rhythm, no murmurs / rubs / gallops. No extremity edema. 2+ pedal pulses. No carotid bruits.  Abdomen: Obese, soft, no tenderness, no masses palpated. No hepatosplenomegaly. Bowel sounds positive.  Musculoskeletal: no clubbing / cyanosis.  Good ROM, no contractures. Normal muscle tone.  Skin: no rashes, lesions, ulcers on limited dermatological examination. Neurologic: Mild right facial droop, all other cranial nerve examination were normal..  Decreased sensation on her right perioral, right perinasal area and right side of extremities.  DTR less brisk on the right.  4/5 right-sided hemiparesis.  Positive mild right pronator drift.  Decreased right hand grip strength.  (The patient stated her symptoms are almost back to baseline, but still persist). Psychiatric: Normal judgment and insight. Alert and oriented x 4.  Mildly anxious mood.   Labs on Admission: I have personally reviewed following labs and imaging studies  CBC: Recent Labs  Lab 11/06/17 0004  WBC 6.4  NEUTROABS 2.8  HGB 12.2  HCT 36.1  MCV 88.0  PLT 161   Basic Metabolic Panel: Recent Labs  Lab 11/06/17 0004  NA 139  K 3.6  CL 104  CO2 27  GLUCOSE 94  BUN 12  CREATININE 0.75  CALCIUM 9.9   GFR: Estimated Creatinine Clearance: 101.5 mL/min (by C-G formula based on SCr of 0.75 mg/dL). Liver Function Tests: Recent Labs  Lab 11/06/17 0004  AST 22  ALT 25  ALKPHOS 72  BILITOT 0.7  PROT 8.3*  ALBUMIN 4.0   No results for input(s): LIPASE, AMYLASE in the last 168 hours. No results for input(s): AMMONIA in the last 168 hours. Coagulation Profile: Recent Labs  Lab 11/06/17 0004  INR 1.00   Cardiac Enzymes: Recent Labs  Lab 11/06/17 0004  TROPONINI <0.03   BNP (last 3  results) No results for input(s): PROBNP in the last 8760 hours. HbA1C: No results for input(s): HGBA1C in the last 72 hours. CBG: No results for input(s): GLUCAP in the last 168 hours. Lipid Profile: No results for input(s): CHOL, HDL, LDLCALC, TRIG, CHOLHDL, LDLDIRECT in the last 72 hours. Thyroid Function Tests: No results for input(s): TSH, T4TOTAL, FREET4, T3FREE, THYROIDAB in the last 72 hours. Anemia Panel: No results for input(s): VITAMINB12, FOLATE, FERRITIN, TIBC, IRON, RETICCTPCT in the last 72 hours. Urine analysis:    Component Value Date/Time   COLORURINE COLORLESS (A) 11/06/2017 0000   APPEARANCEUR CLEAR 11/06/2017 0000   LABSPEC 1.002 (L) 11/06/2017 0000   PHURINE 6.0 11/06/2017 0000   GLUCOSEU NEGATIVE 11/06/2017 0000   HGBUR NEGATIVE 11/06/2017 0000   BILIRUBINUR NEGATIVE 11/06/2017 0000   KETONESUR NEGATIVE 11/06/2017 0000   PROTEINUR NEGATIVE 11/06/2017 0000   UROBILINOGEN 0.2 12/29/2013 0548   NITRITE NEGATIVE 11/06/2017 0000   LEUKOCYTESUR NEGATIVE 11/06/2017 0000  Radiological Exams on Admission: Dg Chest 2 View  Result Date: 11/06/2017 CLINICAL DATA:  52 year old female with TIA. EXAM: CHEST - 2 VIEW COMPARISON:  Chest radiograph dated 08/14/2015 FINDINGS: The heart size and mediastinal contours are within normal limits. Both lungs are clear. The visualized skeletal structures are unremarkable. IMPRESSION: No active cardiopulmonary disease. Electronically Signed   By: Anner Crete M.D.   On: 11/06/2017 04:22   Ct Head Wo Contrast  Result Date: 11/06/2017 CLINICAL DATA:  Right-sided numbness and tingling starting the end of February. EXAM: CT HEAD WITHOUT CONTRAST TECHNIQUE: Contiguous axial images were obtained from the base of the skull through the vertex without intravenous contrast. COMPARISON:  07/14/2011 FINDINGS: Brain: No acute intracranial hemorrhage, midline shift or edema. Chronic left caudate head and a ticket are nucleus lacunar infarct.  Minimal small vessel ischemic disease of periventricular white matter, chronic in appearance. No large vascular territory infarction, intra-axial mass nor extra-axial fluid collections. Chronic mild asymmetry of the lateral ventricles without evidence of hydrocephalus. Midline fourth ventricle and basal cisterns. Vascular: No hyperdense vessel sign. Skull: No acute skull fracture or suspicious osseous lesions. Sinuses/Orbits: Stable calcification in the right maxillary sinus possibly small osteoma or calcified polyp incompletely included. Intact orbits and globes. Other: None IMPRESSION: No acute intracranial abnormality. Chronic minimal small vessel ischemic disease. 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 Electronically Signed   By: Ashley Royalty M.D.   On: 11/06/2017 02:00    EKG: Independently reviewed.  Vent. rate 68 BPM PR interval * ms QRS duration 101 ms QT/QTc 414/441 ms P-R-T axes 66 66 71 Sinus rhythm Probable left atrial enlargement No significant change since previous EKG.  Assessment/Plan Principal Problem:   TIA (transient ischemic attack) Observation/telemetry. Frequent neuro checks. PT/OT evaluation. Check fasting lipids. Check hemoglobin A1c. Check carotid Doppler and echocardiogram. Consult neurology.  Active Problems:   Hypertension Allow permissive hypertension for 24-48 hrs. Up to 220/110 mmHg The patient has been made aware of this and the reason behind permissive hypertension.    COPD (chronic obstructive pulmonary disease) (HCC) Asymptomatic at this time. Supplemental oxygen as needed. Bronchodilator as needed.    Tobacco use Per patient, she smokes 1 cigarette every few days. I advised her to stop smoking altogether, particularly since she is does not get any significant cravings. She declined nicotine replacement therapy. Tobacco cessation information will be provided by the staff.   DVT prophylaxis: Lovenox SQ. Code  Status: Full code. Family Communication: Disposition Plan: Admit for MRI and  Consults called: Telemetry neurology was consulted.  Still pending. Admission status: Observation/telemetry.   Reubin Milan MD [facility] Pager [phone].  If 7PM-7AM, please contact night-coverage [email or web address] Password [email or web address]  11/06/2017, 5:40 AM

## 2017-11-06 NOTE — Progress Notes (Signed)
*  PRELIMINARY RESULTS* Echocardiogram 2D Echocardiogram has been performed.  Samuel Germany 11/06/2017, 10:38 AM

## 2017-11-06 NOTE — ED Notes (Signed)
Report given to Ailene Ravel, RN on 3W.

## 2017-11-06 NOTE — Consult Note (Signed)
Referring Physician: ER Consult Reason: stroke    Chief Complaint: 'Rt side has been numb since Feb"  HPI: Barbara Snow is an 52 y.o. female with COPD, HTN who presented to the ER back in Feb with complaint of right face, arm and leg numbness. No CTH was done at that time and she tells me she was sent home with diagnosis of 'tendonitis". The symptoms have persisted since that time and seem to wax/wane. On 5/10, she tells me she thought symptoms may be worse than usual for some reason that day and simply has gotten tired of feeling this way, so she came back to the ER. She was admitted and neurology consulted for stroke work up.   Past Medical History Past Medical History:  Diagnosis Date  . COPD (chronic obstructive pulmonary disease) ([place])   . Hypertension   . Kidney stone     Surgical History Past Surgical History:  Procedure Laterality Date  . ABDOMINAL HYSTERECTOMY    . CHOLECYSTECTOMY    . TUBAL LIGATION      Family History  Family History  Problem Relation Age of Onset  . Diabetes Mother   . Heart attack Mother   . Heart attack Father   . Liver disease Father   . Hypertension Daughter   . Hypertension Daughter     Social History:   reports that she has been smoking cigarettes.  She has been smoking about 0.00 packs per day. She has never used smokeless tobacco. She reports that she drinks alcohol. She reports that she does not use drugs.  Allergies:  No Known Allergies  Home Medications:  Medications Prior to Admission  Medication Sig Dispense Refill  . acetaminophen (TYLENOL) 500 MG tablet Take 1,000 mg by mouth every 6 (six) hours as needed for moderate pain.    Marland Kitchen albuterol (PROVENTIL HFA;VENTOLIN HFA) 108 (90 Base) MCG/ACT inhaler Inhale 2 puffs into the lungs every 6 (six) hours as needed for wheezing or shortness of breath.    . lisinopril-hydrochlorothiazide (PRINZIDE,ZESTORETIC) 20-12.5 MG per tablet Take 1 tablet by mouth at bedtime.       Hospital  Medications .  stroke: mapping our early stages of recovery book   Does not apply Once  . enoxaparin (LOVENOX) injection  0.5 mg/kg Subcutaneous Q24H    ROS:  History obtained from pt  General ROS: negative for - chills, fatigue, fever, night sweats, weight gain or weight loss Psychological ROS: negative for - behavioral disorder, hallucinations, memory difficulties, mood swings or suicidal ideation Ophthalmic ROS: negative for - blurry vision, double vision, eye pain or loss of vision ENT ROS: negative for - epistaxis, nasal discharge, oral lesions, sore throat, tinnitus or vertigo Allergy and Immunology ROS: negative for - hives or itchy/watery eyes Hematological and Lymphatic ROS: negative for - bleeding problems, bruising or swollen lymph nodes Endocrine ROS: negative for - galactorrhea, hair pattern changes, polydipsia/polyuria or temperature intolerance Respiratory ROS: negative for - cough, hemoptysis, shortness of breath or wheezing Cardiovascular ROS: negative for - chest pain, dyspnea on exertion, edema or irregular heartbeat Gastrointestinal ROS: negative for - abdominal pain, diarrhea, hematemesis, nausea/vomiting or stool incontinence Genito-Urinary ROS: negative for - dysuria, hematuria, incontinence or urinary frequency/urgency Musculoskeletal ROS: negative for - joint swelling or muscular weakness Neurological ROS: as noted in HPI Dermatological ROS: negative for rash and skin lesion changes   Physical Examination:  Vitals:   11/06/17 0900 11/06/17 1000 11/06/17 1324 11/06/17 1745  BP: (!) 141/83 (!) 152/74 Marland Kitchen)  146/89 (!) 153/70  Pulse: (!) 50 (!) 55 90   Resp: 16 20    Temp:   97.9 F (36.6 C) 98.5 F (36.9 C)  TempSrc:   Oral Oral  SpO2: 100% 97% 99% 98%  Weight:      Height:        General - obese, no acute distress Heart - Regular rate and rhythm - no murmer appreciated Lungs - Clear to auscultation  Abdomen - Soft - non tender Extremities - Distal  pulses intact - no edema Skin - Warm and dry   Neurologic Examination:  Mental Status: Alert, oriented, thought content appropriate.  Speech fluent without evidence of aphasia. Able to follow 3 step commands without difficulty. Cranial Nerves: II: Discs not visualized; Visual fields grossly normal, pupils equal, round, reactive to light III,IV, VI: ptosis not present, extra-ocular motions intact bilaterally V,VII: smile symmetric. Reports decrease in sensation in V2 and V3 to light touch  VIII: hearing normal bilaterally IX,X: gag reflex present XI: bilateral shoulder shrug XII: midline tongue extension Motor: RUE - 5/5    LUE - 5/5   RLE - 5/5    LLE - 5/5 Tone and bulk:normal tone throughout except for mildly increased flexor tone to RUE; no atrophy noted Sensory: Reports decreased light touch and temp sensation to the right arm and leg. No extinction to DSS. Deep Tendon Reflexes: 2+ and symmetric throughout Plantars: Right: downgoing   Left: downgoing Cerebellar: normal finger-to-nose and normal heel-to-shin test, except for mild bradykinesia with right FNF. Gait: deferred at this time.   NIHSS 1a Level of Conscious:0 1b LOC Questions: 0 1c LOC Commands:0  2 Best Gaze: 0 3 Visual: 0 4 Facial Palsy:0  5a Motor Arm - left:0  5b Motor Arm - Right:0  6a Motor Leg - Left: 0 6b Motor Leg - Right:0  7 Limb Ataxia: 0 8 Sensory: 1 9 Best Language:0  10 Dysarthria:0 11 Extinct. and Inattention: 0 TOTAL: 1   LABORATORY STUDIES:  Basic Metabolic Panel: Recent Labs  Lab 11/06/17 0004  NA 139  K 3.6  CL 104  CO2 27  GLUCOSE 94  BUN 12  CREATININE 0.75  CALCIUM 9.9    Liver Function Tests: Recent Labs  Lab 11/06/17 0004  AST 22  ALT 25  ALKPHOS 72  BILITOT 0.7  PROT 8.3*  ALBUMIN 4.0   CBC: Recent Labs  Lab 11/06/17 0004  WBC 6.4  NEUTROABS 2.8  HGB 12.2  HCT 36.1  MCV 88.0  PLT 234    Cardiac Enzymes: Recent Labs  Lab 11/06/17 0004   TROPONINI <0.03    Coagulation Studies: Recent Labs    11/06/17 0004  LABPROT 13.1  INR 1.00    Urinalysis:  Recent Labs  Lab 11/06/17 0000  COLORURINE COLORLESS*  LABSPEC 1.002*  PHURINE 6.0  GLUCOSEU NEGATIVE  HGBUR NEGATIVE  BILIRUBINUR NEGATIVE  KETONESUR NEGATIVE  PROTEINUR NEGATIVE  NITRITE NEGATIVE  LEUKOCYTESUR NEGATIVE    Lipid Panel:     Component Value Date/Time   CHOL 157 11/06/2017 0537   TRIG 100 11/06/2017 0537   HDL 40 (L) 11/06/2017 0537   CHOLHDL 3.9 11/06/2017 0537   VLDL 20 11/06/2017 0537   LDLCALC 97 11/06/2017 0537    HgbA1C:  Lab Results  Component Value Date   HGBA1C 5.0 11/06/2017    Urine Drug Screen:      Component Value Date/Time   LABOPIA NONE DETECTED 11/06/2017 0000   COCAINSCRNUR NONE DETECTED 11/06/2017  0000   LABBENZ NONE DETECTED 11/06/2017 0000   AMPHETMU NONE DETECTED 11/06/2017 0000   THCU NONE DETECTED 11/06/2017 0000   LABBARB NONE DETECTED 11/06/2017 0000     Alcohol Level:  Recent Labs  Lab 11/06/17 0004  ETH 31*    IMAGING: Dg Chest 2 View  Result Date: 11/06/2017 CLINICAL DATA:  52 year old female with TIA. EXAM: CHEST - 2 VIEW COMPARISON:  Chest radiograph dated 08/14/2015 FINDINGS: The heart size and mediastinal contours are within normal limits. Both lungs are clear. The visualized skeletal structures are unremarkable. IMPRESSION: No active cardiopulmonary disease. Electronically Signed   By: Anner Crete M.D.   On: 11/06/2017 04:22   Ct Head Wo Contrast  Result Date: 11/06/2017 CLINICAL DATA:  Right-sided numbness and tingling starting the end of February. EXAM: CT HEAD WITHOUT CONTRAST TECHNIQUE: Contiguous axial images were obtained from the base of the skull through the vertex without intravenous contrast. COMPARISON:  07/14/2011 FINDINGS: Brain: No acute intracranial hemorrhage, midline shift or edema. Chronic left caudate head and a ticket are nucleus lacunar infarct. Minimal small vessel  ischemic disease of periventricular white matter, chronic in appearance. No large vascular territory infarction, intra-axial mass nor extra-axial fluid collections. Chronic mild asymmetry of the lateral ventricles without evidence of hydrocephalus. Midline fourth ventricle and basal cisterns. Vascular: No hyperdense vessel sign. Skull: No acute skull fracture or suspicious osseous lesions. Sinuses/Orbits: Stable calcification in the right maxillary sinus possibly small osteoma or calcified polyp incompletely included. Intact orbits and globes. Other: None IMPRESSION: No acute intracranial abnormality. Chronic minimal small vessel ischemic disease. 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 Electronically Signed   By: Ashley Royalty M.D.   On: 11/06/2017 02:00    Assessment: 52 y.o. female with right side numbness since end of Feb. Stroke Risk Factors are prev stroke, TIA, HTN, HLD, Obesity, she admits to smoking "ONE cigarette a week".   # Chronic lacunar strokes seen on CTH, involving the left caudate and anterior left lentiform nucleus. MRI, MRA ordered for better evaluation. CUS added for neck vessel evaluation. Small vessel etiology.   -Echo with 60-65% EF. No LA dilation  -EKG without a-fib  -ASA 325 daily # Cerebral small vessel disease- max med mgt of vasc risks to prevent further lacunar type strokes # Rt side face, arm, leg numbness- no weakness. The left lacunar strokes seen on CT correlate to these symptoms. Need ongoing rehab efforts # HTN- normotensive at this time. No role in permissive HTN at this time # HLD- LDL 97, goal <100. Start high intensity statin # obesity- counseled on diet and exercise.   Plan:  CUS  MRI, MRA of the brain   Carotid ultrasound  PT consult, OT consult, Speech consult  Start lipitor 40mg  PO. Obtain baseline CK level  ASA 325 mg po qd  Risk factor modification  Vascular risk stratification  Telemetry  monitoring  Frequent neuro checks  Stroke education done w/pt and family at bedside  Seen with RN. Updated with attending neurologist, Dr Cheral Marker   I have seen and examined the patient. I have amended the assessment and plan above.  Electronically signed: Dr. Kerney Elbe

## 2017-11-06 NOTE — ED Notes (Signed)
Patient left unit with [facility] 

## 2017-11-07 ENCOUNTER — Observation Stay (HOSPITAL_COMMUNITY): Payer: Self-pay

## 2017-11-07 MED ORDER — CYANOCOBALAMIN 1000 MCG/ML IJ SOLN
1000.0000 ug | Freq: Once | INTRAMUSCULAR | Status: AC
  Administered 2017-11-07: 1000 ug via SUBCUTANEOUS
  Filled 2017-11-07: qty 1

## 2017-11-07 MED ORDER — GADOBENATE DIMEGLUMINE 529 MG/ML IV SOLN
20.0000 mL | Freq: Once | INTRAVENOUS | Status: AC
  Administered 2017-11-07: 20 mL via INTRAVENOUS

## 2017-11-07 MED ORDER — LORAZEPAM 2 MG/ML IJ SOLN
0.5000 mg | INTRAMUSCULAR | Status: DC | PRN
  Administered 2017-11-07: 0.5 mg via INTRAVENOUS
  Filled 2017-11-07: qty 1

## 2017-11-07 MED ORDER — ASPIRIN EC 81 MG PO TBEC
81.0000 mg | DELAYED_RELEASE_TABLET | Freq: Every day | ORAL | Status: DC
  Administered 2017-11-07 – 2017-11-08 (×2): 81 mg via ORAL
  Filled 2017-11-07 (×2): qty 1

## 2017-11-07 NOTE — Progress Notes (Signed)
11/07/17 @1452 . Waiting on meds per RN before we can attempt MRI.

## 2017-11-07 NOTE — Evaluation (Signed)
Occupational Therapy Evaluation Patient Details Name: Barbara Snow MRN: [id number] DOB: May 14, 1966 Today's Date: 11/07/2017    History of Present Illness 52 y.o.femalewith medical history significant ofCOPD, hypertension, urolithiasis who is coming to the emergency department due to worsening facial and right side extremities numbnessthat she has had intermittently since February. MRI negative.   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 Currently, pt performing ADLs and functional mobility at supervision-Min Guard level and requires significant amount of time and effort due to poor functional use of RUE/RLE. Pt avoiding use of RUE during grooming, dressing, and bathing; provided education on learned non-use and importance of incorporating RUE into ADLs. Pt would benefit from further acute OT to increase independence and safety with ADLs as well as provide exercises for RUE. Recommend dc to with follow up at neuro outpatient for further OT to optimize safety, functional use of RUE, independence with ADLs/IADLs, and return to PLOF.      Follow Up Recommendations  Outpatient OT;Supervision/Assistance - 24 hour(Neuro OP OT)    Equipment Recommendations  None recommended by OT    Recommendations for Other Services PT consult     Precautions / Restrictions Precautions Precautions: Fall Restrictions Weight Bearing Restrictions: No      Mobility Bed Mobility Overal bed mobility: Modified Independent             General bed mobility comments: Increased time and effort  Transfers Overall transfer level: Needs assistance Equipment used: None Transfers: Sit to/from Stand Sit to Stand: Min guard         General transfer comment: Min Guard for safety. Pt with increased effort for sit<>stand    Balance Overall balance assessment: Needs assistance Sitting-balance support: No upper extremity  supported;Feet supported Sitting balance-Leahy Scale: Good     Standing balance support: No upper extremity supported;During functional activity Standing balance-Leahy Scale: Fair                             ADL either performed or assessed with clinical judgement   ADL Overall ADL's : Needs assistance/impaired Eating/Feeding: Set up;Sitting   Grooming: Wash/dry hands;Standing;Min guard;Applying deodorant;Wash/dry face Grooming Details (indicate cue type and reason): Pt avoiding use of RUE and holding in air at chest level. Providing use of incorporating RUE into task. Edcuating pt on benefits for WBing through RUE and RLE. Upper Body Bathing: Standing;Min guard Upper Body Bathing Details (indicate cue type and reason): UB sponge bath at sink with Min Guard for safety Lower Body Bathing: Min guard;Sit to/from stand Lower Body Bathing Details (indicate cue type and reason): Min Guard for safety Upper Body Dressing : Standing;Min guard Upper Body Dressing Details (indicate cue type and reason): donning/doffing shirts at sink with Mi nguard for safety Lower Body Dressing: Min guard;Sit to/from stand Lower Body Dressing Details (indicate cue type and reason): donning/doffing underwear, pants, and socks Toilet Transfer: Min guard;Ambulation;Regular Toilet;Grab bars   Toileting- Clothing Manipulation and Hygiene: Min guard;Sit to/from stand;Sitting/lateral lean       Functional mobility during ADLs: Min guard General ADL Comments: Pt able to perform ADLs and functional mobility at supervision-Min Guard level. Pt with increased effort and time to perform ADLs. Pt not using right dominant hand and required cues to incorporate into task. Cues to faciltiating normalized movement. Educating pt on learned non-use and need to use RUE as normal.  Vision Baseline Vision/History: Wears glasses Wears Glasses: At all times Patient Visual Report: No change from baseline Additional  Comments: Glasses are broken (happened at work)     Environmental education officer      Pertinent Vitals/Pain Pain Assessment: No/denies pain     Hand Dominance Right   Extremity/Trunk Assessment Upper Extremity Assessment Upper Extremity Assessment: RUE deficits/detail   Lower Extremity Assessment Lower Extremity Assessment: RLE deficits/detail RLE Deficits / Details: Pt with decreased use of RUE and hand. With cues and increased effort, pt able to performing AROM for hand, wrist, elbow, and shoulder as well as finger flexion. Pt reporting numbness. Pt with decreased grasp strength. RLE Sensation: decreased light touch RLE Coordination: decreased fine motor;decreased gross motor   Cervical / Trunk Assessment Cervical / Trunk Assessment: Other exceptions Cervical / Trunk Exceptions: Increased body habitus   Communication Communication Communication: No difficulties   Cognition Arousal/Alertness: Awake/alert Behavior During Therapy: WFL for tasks assessed/performed Overall Cognitive Status: Within Functional Limits for tasks assessed                                     General Comments  Sister present during session    Exercises Exercises: Other exercises Other Exercises Other Exercises: Educating pt on RUE excercises to increase strength and ROM as well as facilitate return of normal movement   Shoulder Instructions      Home Living Family/patient expects to be discharged to:: Private residence Living Arrangements: Spouse/significant other(Boyfriend) Available Help at Discharge: Available 24 hours/day;Family;Friend(s)(Boyfriend and sister) Type of Home: Apartment(first floor) Home Access: Level entry     Home Layout: One level     Bathroom Shower/Tub: Tub/shower unit;Curtain   Biochemist, clinical: Standard     Home Equipment: None          Prior Functioning/Environment Level of Independence: Independent        Comments: ADLs, IADLs, driving,  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        OT Problem List: Decreased strength;Decreased range of motion;Decreased activity tolerance;Impaired balance (sitting and/or standing);Decreased cognition;Decreased coordination;Decreased safety awareness;Decreased knowledge of use of DME or AE;Decreased knowledge of precautions;Impaired UE functional use      OT Treatment/Interventions: Self-care/ADL training;Therapeutic exercise;Energy conservation;DME and/or AE instruction;Therapeutic activities;Patient/family education    OT Goals(Current goals can be found in the care plan section) Acute Rehab OT Goals Patient Stated Goal: Go home soon OT Goal Formulation: With patient Time For Goal Achievement: 11/21/17 Potential to Achieve Goals: Good ADL Goals Pt/caregiver will Perform Home Exercise Program: Increased ROM;Increased strength;Right Upper extremity;With written HEP provided;Independently Additional ADL Goal #1: Pt will verbalize 6/6 stroke signs and symptoms independently Additional ADL Goal #2: Pt will perform ADLs at Mod I level  OT Frequency: Min 2X/week   Barriers to D/C:            Co-evaluation              AM-PAC PT "6 Clicks" Daily Activity     Outcome Measure Help from another person eating meals?: None Help from another person taking care of personal grooming?: A Little Help from another person toileting, which includes using toliet, bedpan, or urinal?: A Little Help from another person bathing (including washing, rinsing, drying)?: A Little Help from another person to put on and taking off regular upper body clothing?: A  Little Help from another person to put on and taking off regular lower body clothing?: A Little 6 Click Score: 19   End of Session Nurse Communication: Mobility status  Activity Tolerance: Patient tolerated treatment well Patient left: in bed;with call bell/phone within reach;with bed alarm  set;with family/visitor present  OT Visit Diagnosis: Unsteadiness on feet (R26.81);Other abnormalities of gait and mobility (R26.89);Muscle weakness (generalized) (M62.81);Other symptoms and signs involving cognitive function;Hemiplegia and hemiparesis Hemiplegia - Right/Left: Right Hemiplegia - dominant/non-dominant: Dominant Hemiplegia - caused by: Unspecified                Time: 0737-0809 OT Time Calculation (min): 32 min Charges:  OT General Charges $OT Visit: 1 Visit OT Evaluation $OT Eval Moderate Complexity: 1 Mod OT Treatments $Self Care/Home Management : 8-22 mins G-Codes:     Tiara Maultsby MSOT, OTR/L Acute Rehab Pager: [phone] Office: [place] 11/07/2017, 9:24 AM

## 2017-11-07 NOTE — Progress Notes (Addendum)
STROKE TEAM PROGRESS NOTE   HISTORY OF PRESENT ILLNESS (per record) Barbara Snow is an 52 y.o. female with COPD and HTN who presented to the ER back in Feb with complaint of right face, arm and leg numbness. No CTH was done at that time and she tells me she was sent home with diagnosis of 'tendonitis". The symptoms have persisted since that time and seem to wax/wane. On 5/10, she tells me she thought symptoms may be worse than usual for some reason that day and simply has gotten tired of feeling this way, so she came back to the ER. She was admitted and neurology consulted for stroke work up    SUBJECTIVE (Comfort) Her sister is at the bedside.  Pt continue to have R weakness and numbness, MRI showed no acute stroke, was using her R arm right when I entered the room then she felt weaker, ? Functional exam     OBJECTIVE Temp:  [97.5 F (36.4 C)-98.7 F (37.1 C)] 98.7 F (37.1 C) (05/12 0843) Pulse Rate:  [56-90] 65 (05/12 0843) Cardiac Rhythm: Normal sinus rhythm (05/12 0723) Resp:  [15-18] 15 (05/12 0843) BP: (116-165)/(70-95) 136/87 (05/12 0843) SpO2:  [97 %-100 %] 97 % (05/12 0843)  CBC:  Recent Labs  Lab 11/06/17 0004  WBC 6.4  NEUTROABS 2.8  HGB 12.2  HCT 36.1  MCV 88.0  PLT 409    Basic Metabolic Panel:  Recent Labs  Lab 11/06/17 0004  NA 139  K 3.6  CL 104  CO2 27  GLUCOSE 94  BUN 12  CREATININE 0.75  CALCIUM 9.9    Lipid Panel:     Component Value Date/Time   CHOL 157 11/06/2017 0537   TRIG 100 11/06/2017 0537   HDL 40 (L) 11/06/2017 0537   CHOLHDL 3.9 11/06/2017 0537   VLDL 20 11/06/2017 0537   LDLCALC 97 11/06/2017 0537   HgbA1c:  Lab Results  Component Value Date   HGBA1C 5.0 11/06/2017   Urine Drug Screen:     Component Value Date/Time   LABOPIA NONE DETECTED 11/06/2017 0000   COCAINSCRNUR NONE DETECTED 11/06/2017 0000   LABBENZ NONE DETECTED 11/06/2017 0000   AMPHETMU NONE DETECTED 11/06/2017 0000   THCU NONE DETECTED  11/06/2017 0000   LABBARB NONE DETECTED 11/06/2017 0000    Alcohol Level     Component Value Date/Time   ETH 31 (H) 11/06/2017 0004    IMAGING  Dg Chest 2 View 11/06/2017 IMPRESSION:  No active cardiopulmonary disease.    Ct Head Wo Contrast 11/06/2017 IMPRESSION:  No acute intracranial abnormality. Chronic minimal small vessel ischemic disease. 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    Mr Jodene Nam Head/brain Wo Cm 11/07/2017 IMPRESSION:   MRI HEAD:  1. No acute intracranial process.  2. Old LEFT basal ganglia infarct.  3. Mild parenchymal brain volume loss for age.   MRA HEAD:  1. No emergent large vessel occlusion on this mildly motion degraded examination.  2. Advanced intracranial atherosclerosis resulting in severe stenosis LEFT P2 segment, moderate stenosis LEFT MCA and bilateral PCA.    Transthoracic Echocardiogram  11/06/2017 Study Conclusions - Left ventricle: The cavity size was normal. Wall thickness was   normal. Systolic function was normal. The estimated ejection   fraction was in the range of 60% to 65%. Wall motion was normal;   there were no regional wall motion abnormalities. Indeterminate   diastolic function. - Aortic valve: Mildly calcified annulus. Trileaflet. - Mitral  valve: There was trivial regurgitation. - Right atrium: Central venous pressure (est): 3 mm Hg. - Atrial septum: No defect or patent foramen ovale was identified. - Tricuspid valve: There was trivial regurgitation. - Pulmonary arteries: Systolic pressure could not be accurately   estimated. - Pericardium, extracardiac: There was no pericardial effusion.   Bilateral Carotid Dopplers - pending      PHYSICAL EXAM Vitals:   11/06/17 1939 11/06/17 2348 11/07/17 0423 11/07/17 0843  BP: (!) 165/95 116/80 (!) 147/86 136/87  Pulse: 68 (!) 56 62 65  Resp: 18 16 18 15   Temp: 98.6 F (37 C) 98.1 F (36.7 C) (!) 97.5 F (36.4 C) 98.7 F (37.1 C)   TempSrc: Oral Oral Oral Oral  SpO2: 99% 100% 100% 97%  Weight:      Height:        General -  Heart - Regular rate and rhythm - no murmer appreciated Lungs - Clear to auscultation anteriorly Abdomen - Soft - non tender Extremities - Distal pulses intact - no edema Skin - Warm and dry  Mental Status: Alert, oriented, thought content appropriate.  Speech fluent without evidence of aphasia.  Able to follow 3 step commands without difficulty. Cranial Nerves: CN2-12 intact  Motor: RUE - 5/5    LUE - 5/5 RLE - 5/5    LLE -  5/5 Tone and bulk:normal tone throughout; no atrophy noted Sensory: decreased to Light touch on R Deep Tendon Reflexes: 2+ and symmetric throughout Plantars: DOWN   Cerebellar: normal finger-to-nose, normal rapid alternating movements and normal heel-to-shin test Gait: not tested    ASSESSMENT/PLAN [name] is a 52 y.o. female with history of hypertension, COPD with ongoing tobacco use, and history of renal calculi  presenting with right-sided numbness. She did not receive IV t-PA due to late presentation.  Possible functional weakness vs old remoteStroke:  Old left basal ganglia infarct.- possibly due to small vessel disease  Resultant R weakness   CT head - No acute intracranial abnormality.  MRI head - no acute intracranial process. Old left basal ganglia infarct.  MRA head - severe stenosis LEFT P2 segment, moderate stenosis LEFT MCA and bilateral PCA.   Carotid Doppler - pending  MRI Cspine pending   2D Echo - EF 60-65%. No cardiac source of emboli identified.  LDL - 97  HgbA1c - 5.0  VTE prophylaxis - Lovenox   Diet Order           Diet Heart Room service appropriate? Yes; Fluid consistency: Thin  Diet effective now          No antithrombotic prior to admission, now on No antithrombotic - start ASA 81 mg daily  Patient counseled to be compliant with her antithrombotic medications  Ongoing aggressive stroke risk factor  management  Therapy recommendations:  HH PT recommended  Disposition:  Pending  Hypertension  Blood pressure tends to run high . Long-term BP goal normotensive  Hyperlipidemia  Lipid lowering medication PTA:  none  LDL 97, goal < 70  Current lipid lowering medication: now on Lipitor 40 mg daily  Continue statin at discharge   Other Stroke Risk Factors  Cigarette smoker - advised to stop smoking  ETOH use, advised to drink no more than 1 alcoholic beverage per day.  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 Functional weakness? Might need psychiatry consult   Therapy Follow Up: home health physical therapy recommended  Disposition: pending  Antiplatelet / Anticoagulation: start aspirin 81 mg daily .  Statin: continue Lipitor 40 mg daily  MD Follow Up: [facility] - Dr Leonie Man an 6 to 8 weeks  Other: pending  Further risk factor modification per primary care MD: Follow Up 2 weeks   Hospital day # 0  Arnaldo Natal, MD   To contact Stroke Continuity provider, please refer to [email or web address] After hours, contact General Neurology

## 2017-11-07 NOTE — Progress Notes (Signed)
[facility] Progress Note  Patient: Barbara Snow WUX:[id number]   PCP: System, Provider Not In DOB: 08/09/1965   DOA: 11/05/2017   DOS: 11/07/2017   Date of Service: the patient was seen and examined on 11/07/2017  Subjective: Still complains about having numbness of the face on the right, pins and needle sensation around her lips on the right and weakness in the right side.  No nausea no vomiting no fever no chills.  No change since presentation.  Brief hospital course: Pt. with PMH of COPD, HTN, nephrolithiasis; admitted on 11/05/2017, presented with complaint of right-sided numbness and weakness, was found to have old lacunar infarct. Currently further plan is further work-up with C-spine per neurology.  Assessment and Plan: 1.  CVA, chronic. Right-sided numbness and weakness-?  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 MRI also shows severe stenosis of left PT and left MCA territory. Carotid Doppler pending. Neurology has ordered MRI C-spine with contrast for further work-up which is also pending. Patient has poor grip on the right but then she is able to lift her hand bilaterally equally against gravity without any drop, pronator drift or asterixis. LDL 97, hemoglobin A1c 5.0. No antithrombotic before, started on 81 mg aspirin. PT OT recommended home health PT and outpatient OT. Patient passed stroke swallow evaluation in the ER. Home once MRI completed and cleared by neurology.  2.  Accelerated hypertension. Presented with high blood pressure systolic 253. Blood pressure now stable. No antihypertensive added at present.  3.  Dyslipidemia. On Lipitor 40 mg daily. Outpatient follow-up.  4.  Active smoker. Cessation counseled. Monitor.  5.  Morbid obesity. Body mass index is 37.12 kg/m. Recommend weight loss diet and exercise.  Diet: Cardiac diet DVT Prophylaxis: subcutaneous Heparin  Advance goals of care discussion: Full code  Family  Communication: no family was present at bedside, at the time of interview.  Disposition:  Discharge to home.  Consultants: neurology Procedures: Echocardiogram   Antibiotics: Anti-infectives (From admission, onward)   None       Objective: Physical Exam: Vitals:   11/06/17 2348 11/07/17 0423 11/07/17 0843 11/07/17 1220  BP: 116/80 (!) 147/86 136/87 131/77  Pulse: (!) 56 62 65 (!) 102  Resp: 16 18 15 17   Temp: 98.1 F (36.7 C) (!) 97.5 F (36.4 C) 98.7 F (37.1 C) 98.6 F (37 C)  TempSrc: Oral Oral Oral Oral  SpO2: 100% 100% 97%   Weight:      Height:       No intake or output data in the 24 hours ending 11/07/17 1423 Filed Weights   11/05/17 2208  Weight: 104.3 kg (230 lb)   General: Alert, Awake and Oriented to Time, Place and Person. Appear in mild distress, affect appropriate Eyes: PERRL, Conjunctiva normal ENT: Oral Mucosa clear moist. Neck: difficult to assess JVD, nbo Abnormal Mass Or lumps Cardiovascular: S1 and S2 Present, no Murmur, Peripheral Pulses Present Respiratory: normal respiratory effort, Bilateral Air entry equal and Decreased, no use of accessory muscle, Clear to Auscultation, no Crackles, no wheezes Abdomen: Bowel Sound present, Soft and no tenderness, no hernia Skin: no redness, no Rash, no induration Extremities: no Pedal edema, no calf tenderness Neurologic:  Mental status AAOx3, speech normal, attention normal,  Cranial Nerves PERRL, EOM normal and present, no facial droop. Subjective right-sided numbness Motor strength bilateral equal strength 5/5, arm and forearm.  Poor grip on the right side. Right-sided numbness to light touch. Cerebellar test normal finger  nose finger. Gait not checked due to patient safety concerns.  Data Reviewed: CBC: Recent Labs  Lab 11/06/17 0004  WBC 6.4  NEUTROABS 2.8  HGB 12.2  HCT 36.1  MCV 88.0  PLT 235   Basic Metabolic Panel: Recent Labs  Lab 11/06/17 0004  NA 139  K 3.6  CL 104  CO2 27    GLUCOSE 94  BUN 12  CREATININE 0.75  CALCIUM 9.9    Liver Function Tests: Recent Labs  Lab 11/06/17 0004  AST 22  ALT 25  ALKPHOS 72  BILITOT 0.7  PROT 8.3*  ALBUMIN 4.0   No results for input(s): LIPASE, AMYLASE in the last 168 hours. No results for input(s): AMMONIA in the last 168 hours. Coagulation Profile: Recent Labs  Lab 11/06/17 0004  INR 1.00   Cardiac Enzymes: Recent Labs  Lab 11/06/17 0004  TROPONINI <0.03   BNP (last 3 results) No results for input(s): PROBNP in the last 8760 hours. CBG: No results for input(s): GLUCAP in the last 168 hours. Studies: Mr Brain Wo Contrast  Result Date: 11/07/2017 CLINICAL DATA:  Intermittent, progressively worsening facial and RIGHT sided numbness since February. Difficulty ambulating. History of hypertension. EXAM: MRI HEAD WITHOUT CONTRAST MRA HEAD WITHOUT CONTRAST TECHNIQUE: Multiplanar, multiecho pulse sequences of the brain and surrounding structures were obtained without intravenous contrast. Angiographic images of the head were obtained using MRA technique without contrast. COMPARISON:  CT HEAD Nov 06, 2017 FINDINGS: MRI HEAD FINDINGS INTRACRANIAL CONTENTS: No reduced diffusion to suggest acute ischemia or hyperacute demyelination. No susceptibility artifact to suggest hemorrhage. Old LEFT basal ganglia infarcts with ex vacuo dilatation LEFT lateral ventricle. Mild parenchymal brain volume loss for age. No suspicious parenchymal signal, masses, mass effect. No abnormal extra-axial fluid collections. No extra-axial masses. 8 mm pineal cyst. VASCULAR: Normal major intracranial vascular flow voids present at skull base. SKULL AND UPPER CERVICAL SPINE: No abnormal sellar expansion. No suspicious calvarial bone marrow signal. Craniocervical junction maintained. SINUSES/ORBITS: The mastoid air-cells and included paranasal sinuses are well-aerated.The included ocular globes and orbital contents are non-suspicious. OTHER: None. MRA  HEAD FINDINGS-Mild motion degraded examination. ANTERIOR CIRCULATION: Normal flow related enhancement of the included cervical, petrous, cavernous and supraclinoid internal carotid arteries. Patent anterior communicating artery. Patent anterior and middle cerebral arteries, mild luminal irregularity compatible with atherosclerosis. Moderate stenosis LEFT M2 origin, inferior division. No large vessel occlusion, aneurysm. POSTERIOR CIRCULATION: LEFT vertebral artery is dominant. Basilar artery is patent, with normal flow related enhancement of the main branch vessels. Patent posterior cerebral arteries, moderate stenosis bilateral P2 segment, severe stenosis LEFT distal P2 segment. No large vessel occlusion, aneurysm. ANATOMIC VARIANTS: None. Source images and MIP images were reviewed. IMPRESSION: MRI HEAD: 1. No acute intracranial process. 2. Old LEFT basal ganglia infarct. 3. Mild parenchymal brain volume loss for age. MRA HEAD: 1. No emergent large vessel occlusion on this mildly motion degraded examination. 2. Advanced intracranial atherosclerosis resulting in severe stenosis LEFT P2 segment, moderate stenosis LEFT MCA and bilateral PCA. Electronically Signed   By: Elon Alas M.D.   On: 11/07/2017 03:00   Mr Jodene Nam Head/brain TI Cm  Result Date: 11/07/2017 CLINICAL DATA:  Intermittent, progressively worsening facial and RIGHT sided numbness since February. Difficulty ambulating. History of hypertension. EXAM: MRI HEAD WITHOUT CONTRAST MRA HEAD WITHOUT CONTRAST TECHNIQUE: Multiplanar, multiecho pulse sequences of the brain and surrounding structures were obtained without intravenous contrast. Angiographic images of the head were obtained using MRA technique without contrast. COMPARISON:  CT HEAD  Nov 06, 2017 FINDINGS: MRI HEAD FINDINGS INTRACRANIAL CONTENTS: No reduced diffusion to suggest acute ischemia or hyperacute demyelination. No susceptibility artifact to suggest hemorrhage. Old LEFT basal ganglia  infarcts with ex vacuo dilatation LEFT lateral ventricle. Mild parenchymal brain volume loss for age. No suspicious parenchymal signal, masses, mass effect. No abnormal extra-axial fluid collections. No extra-axial masses. 8 mm pineal cyst. VASCULAR: Normal major intracranial vascular flow voids present at skull base. SKULL AND UPPER CERVICAL SPINE: No abnormal sellar expansion. No suspicious calvarial bone marrow signal. Craniocervical junction maintained. SINUSES/ORBITS: The mastoid air-cells and included paranasal sinuses are well-aerated.The included ocular globes and orbital contents are non-suspicious. OTHER: None. MRA HEAD FINDINGS-Mild motion degraded examination. ANTERIOR CIRCULATION: Normal flow related enhancement of the included cervical, petrous, cavernous and supraclinoid internal carotid arteries. Patent anterior communicating artery. Patent anterior and middle cerebral arteries, mild luminal irregularity compatible with atherosclerosis. Moderate stenosis LEFT M2 origin, inferior division. No large vessel occlusion, aneurysm. POSTERIOR CIRCULATION: LEFT vertebral artery is dominant. Basilar artery is patent, with normal flow related enhancement of the main branch vessels. Patent posterior cerebral arteries, moderate stenosis bilateral P2 segment, severe stenosis LEFT distal P2 segment. No large vessel occlusion, aneurysm. ANATOMIC VARIANTS: None. Source images and MIP images were reviewed. IMPRESSION: MRI HEAD: 1. No acute intracranial process. 2. Old LEFT basal ganglia infarct. 3. Mild parenchymal brain volume loss for age. MRA HEAD: 1. No emergent large vessel occlusion on this mildly motion degraded examination. 2. Advanced intracranial atherosclerosis resulting in severe stenosis LEFT P2 segment, moderate stenosis LEFT MCA and bilateral PCA. Electronically Signed   By: Elon Alas M.D.   On: 11/07/2017 03:00    Scheduled Meds: .  stroke: mapping our early stages of recovery book   Does  not apply Once  . aspirin EC  81 mg Oral Daily  . atorvastatin  40 mg Oral q1800  . enoxaparin (LOVENOX) injection  0.5 mg/kg Subcutaneous Q24H   Continuous Infusions: PRN Meds: acetaminophen **OR** acetaminophen (TYLENOL) oral liquid 160 mg/5 mL **OR** acetaminophen, ondansetron **OR** ondansetron (ZOFRAN) IV, zolpidem  Time spent: 35 minutes  Author: Berle Mull, MD [facility] Pager: [phone] 11/07/2017 2:23 PM  If 7PM-7AM, please contact night-coverage at [email or web address], password [email or web address]

## 2017-11-08 ENCOUNTER — Observation Stay (HOSPITAL_BASED_OUTPATIENT_CLINIC_OR_DEPARTMENT_OTHER): Payer: Self-pay

## 2017-11-08 DIAGNOSIS — G459 Transient cerebral ischemic attack, unspecified: Secondary | ICD-10-CM

## 2017-11-08 MED ORDER — ASPIRIN 81 MG PO TBEC: 81.0000 mg | DELAYED_RELEASE_TABLET | Freq: Every day | ORAL | 0 refills | Status: DC

## 2017-11-08 MED ORDER — TOPIRAMATE 50 MG PO TABS: ORAL_TABLET | ORAL | 0 refills | Status: DC

## 2017-11-08 MED ORDER — TOPIRAMATE 25 MG PO TABS: 50.0000 mg | ORAL_TABLET | Freq: Every day | ORAL | Status: DC

## 2017-11-08 MED ORDER — ATORVASTATIN CALCIUM 40 MG PO TABS: 40.0000 mg | ORAL_TABLET | Freq: Every day | ORAL | 0 refills | Status: DC

## 2017-11-08 MED ORDER — TOPIRAMATE 25 MG PO TABS: 50.0000 mg | ORAL_TABLET | Freq: Two times a day (BID) | ORAL | Status: DC

## 2017-11-08 NOTE — Evaluation (Signed)
Speech Language Pathology Evaluation Patient Details Name: Barbara Snow MRN: [id number] DOB: 05-18-1966 Today's Date: 11/08/2017 Time: 1013-1030 SLP Time Calculation (min) (ACUTE ONLY): 17 min  Problem List:  Patient Active Problem List   Diagnosis Date Noted  . TIA (transient ischemic attack) 11/06/2017  . Hypertension 11/06/2017  . COPD (chronic obstructive pulmonary disease) ([place]) 11/06/2017  . Tobacco use 11/06/2017   Past Medical History:  Past Medical History:  Diagnosis Date  . COPD (chronic obstructive pulmonary disease) ([place])   . Hypertension   . Kidney stone    Past Surgical History:  Past Surgical History:  Procedure Laterality Date  . ABDOMINAL HYSTERECTOMY    . CHOLECYSTECTOMY    . TUBAL LIGATION     HPI:  Barbara Snow is a 52 y.o. female with medical history significant of COPD, hypertension, urolithiasis who is coming to the emergency department due to worsening facial and right side extremities numbness that she has had intermittently since February.  However, she mentions that since 0900 yesterday, her symptoms have persisted.  Her significant other noticed that she was having facial droop on the right side earlier, but not initially seen in the ED. She also reports that she was having trouble walking it was so pronounced that her sister asked her to seek medical help.  No blurred vision, slurred speech, word finding difficulty, dizziness, nausea or emesis.  No fever, chills, sore throat, dyspnea, chest pain, palpitations, diaphoresis, PND orthopnea.  She occasionally gets pitting edema of the lower extremities.  No abdominal pain, diarrhea, melena or hematochezia.  No dysuria, frequency or hematuria.  Denies heat or cold intolerance.  No polyuria, polydipsia or blurred vision.   Assessment / Plan / Recommendation Clinical Impression  Pt presents with functional cognitive abilities. As SLP explained this to pt, she began to describe difficulty with speech  intelligibility - although no dysarthria or difficulty with intelligibility was noted. Pt with inconsistencies in description. She also states that she has difficulty drinking. Of note, she was consuming Sprite via straw with no overt s/s of aspiration but reports she was slightly lighted headed. With emotional support, pt's light headedness resolved. ST to sign off at this time.     SLP Assessment  SLP Recommendation/Assessment: Patient does not need any further Speech Lanaguage Pathology Services SLP Visit Diagnosis: Cognitive communication deficit (R41.841)    Follow Up Recommendations  None    Frequency and Duration           SLP Evaluation Cognition  Overall Cognitive Status: Within Functional Limits for tasks assessed Arousal/Alertness: Awake/alert Orientation Level: Oriented X4       Comprehension  Auditory Comprehension Overall Auditory Comprehension: Appears within functional limits for tasks assessed Yes/No Questions: Within Functional Limits Visual Recognition/Discrimination Discrimination: Within Function Limits Reading Comprehension Reading Status: Within funtional limits    Expression Expression Primary Mode of Expression: Verbal Verbal Expression Overall Verbal Expression: Appears within functional limits for tasks assessed Initiation: No impairment Level of Generative/Spontaneous Verbalization: Conversation Pragmatics: No impairment Written Expression Dominant Hand: Right   Oral / Motor  Motor Speech Overall Motor Speech: Appears within functional limits for tasks assessed Respiration: Within functional limits Phonation: Normal Resonance: Within functional limits Articulation: Within functional limitis Intelligibility: Intelligible Motor Planning: Witnin functional limits Motor Speech Errors: Not applicable   GO                    Barbara Snow 11/08/2017, 11:23 AM

## 2017-11-08 NOTE — Discharge Summary (Signed)
[facility] Discharge Summary   Patient: Barbara Snow OHY:[id number]   PCP: System, Provider Not In DOB: 1966/02/12   Date of admission: 11/05/2017   Date of discharge:  11/08/2017    Discharge Diagnoses:  Principal Problem:   TIA (transient ischemic attack) Active Problems:   Hypertension   COPD (chronic obstructive pulmonary disease) ([place])   Tobacco use   Admitted From: home Disposition:  Home with home health  Recommendations for Outpatient Follow-up:  1. Please follow-up with PCP in 1 week.  Follow-up Information    PCP. Schedule an appointment as soon as possible for a visit in 1 week(s).          Diet recommendation: cardiac diet  Activity: The patient is advised to gradually reintroduce usual activities.  Discharge Condition: good  Code Status: full code  History of present illness: As per the H and P dictated on admission, "Barbara Snow is a 52 y.o. female with medical history significant of COPD, hypertension, urolithiasis who is coming to the emergency department due to worsening facial and right side extremities numbness that she has had intermittently since February.  However, she mentions that since 0900 yesterday, her symptoms have persisted.  Her significant other noticed that she was having facial droop on the right side earlier, but not initially seen in the ED. She also reports that she was having trouble walking it was so pronounced that her sister asked her to seek medical help.  No blurred vision, slurred speech, word finding difficulty, dizziness, nausea or emesis.  No fever, chills, sore throat, dyspnea, chest pain, palpitations, diaphoresis, PND orthopnea.  She occasionally gets pitting edema of the lower extremities.  No abdominal pain, diarrhea, melena or hematochezia.  No dysuria, frequency or hematuria.  Denies heat or cold intolerance.  No polyuria, polydipsia or blurred vision"  Hospital Course:  Summary of her active problems in the hospital  is as following. 1.  CVA, chronic. Right-sided numbness and weakness-?  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 MRI also shows severe stenosis of left PT and left MCA territory. Carotid Doppler unremarkable Patient has poor grip on the right but then she is able to lift her hand bilaterally equally against gravity without any drop, pronator drift or asterixis. LDL 97, hemoglobin A1c 5.0. No antithrombotic before, started on 81 mg aspirin. PT OT recommended home health PT and outpatient OT. Patient passed stroke swallow evaluation in the ER. MRI C-spine shows no acute abnormality. Neurology added Topamax. Outpatient follow-up with neurology.  2.  Accelerated hypertension. Presented with high blood pressure systolic 948. Blood pressure now stable. No antihypertensive added at present.  3.  Dyslipidemia. On Lipitor 40 mg daily. Outpatient follow-up.  4.  Active smoker. Cessation counseled. Monitor.  5.  Morbid obesity. Body mass index is 37.12 kg/m. Recommend weight loss diet and exercise.  All other chronic medical condition were stable during the hospitalization.  Patient was seen by physical therapy, who recommended home health, which was arranged by Education officer, museum and case Freight forwarder. On the day of the discharge the patient's vitals were stable, and no other acute medical condition were reported by patient. the patient was felt safe to be discharge at home with home health.  Consultants: neurology  Procedures: Echocardiogram   DISCHARGE MEDICATION: Allergies as of 11/08/2017   No Known Allergies     Medication List    STOP taking these medications   cyclobenzaprine 10 MG tablet Commonly known as:  FLEXERIL   dexamethasone 4 MG tablet Commonly known as:  DECADRON   ibuprofen 600 MG tablet Commonly known as:  ADVIL,MOTRIN   naproxen 500 MG tablet Commonly known as:  NAPROSYN   ondansetron 4 MG disintegrating tablet Commonly known as:   ZOFRAN ODT   ondansetron 4 MG tablet Commonly known as:  ZOFRAN   traMADol 50 MG tablet Commonly known as:  ULTRAM     TAKE these medications   acetaminophen 500 MG tablet Commonly known as:  TYLENOL Take 1,000 mg by mouth every 6 (six) hours as needed for moderate pain.   albuterol 108 (90 Base) MCG/ACT inhaler Commonly known as:  PROVENTIL HFA;VENTOLIN HFA Inhale 2 puffs into the lungs every 6 (six) hours as needed for wheezing or shortness of breath.   aspirin 81 MG EC tablet Take 1 tablet (81 mg total) by mouth daily.   atorvastatin 40 MG tablet Commonly known as:  LIPITOR Take 1 tablet (40 mg total) by mouth daily at 6 PM.   lisinopril-hydrochlorothiazide 20-12.5 MG tablet Commonly known as:  PRINZIDE,ZESTORETIC Take 1 tablet by mouth at bedtime.   topiramate 50 MG tablet Commonly known as:  TOPAMAX 50 mg daily for 7 days then from 11/15/2017 take 50 mg bid      No Known Allergies Discharge Instructions    Ambulatory referral to Neurology   Complete by:  As directed    Seen by stroke team but MRI neg. Needs complicated migraine followup   Diet - low sodium heart healthy   Complete by:  As directed    Discharge instructions   Complete by:  As directed    It is important that you read following instructions as well as go over your medication list with RN to help you understand your care after this hospitalization.  Discharge Instructions: Please follow-up with PCP in one week  Please request your primary care physician to go over all Hospital Tests and Procedure/Radiological results at the follow up,  Please get all Hospital records sent to your PCP by signing hospital release before you go home.   Do not take more than prescribed Pain, Sleep and Anxiety Medications. You were cared for by a hospitalist during your hospital stay. If you have any questions about your discharge medications or the care you received while you were in the hospital after you are  discharged, you can call the unit and ask to speak with the hospitalist on call if the hospitalist that took care of you is not available.  Once you are discharged, your primary care physician will handle any further medical issues. Please note that NO REFILLS for any discharge medications will be authorized once you are discharged, as it is imperative that you return to your primary care physician (or establish a relationship with a primary care physician if you do not have one) for your aftercare needs so that they can reassess your need for medications and monitor your lab values. You Must read complete instructions/literature along with all the possible adverse reactions/side effects for all the Medicines you take and that have been prescribed to you. Take any new Medicines after you have completely understood and accept all the possible adverse reactions/side effects. Wear Seat belts while driving. If you have smoked or chewed Tobacco in the last 2 yrs please stop smoking and/or stop any Recreational drug use.   Increase activity slowly   Complete by:  As directed      Discharge Exam: Filed Weights  11/05/17 2208  Weight: 104.3 kg (230 lb)   Vitals:   11/08/17 0531 11/08/17 0805  BP: 125/70 (!) 136/92  Pulse: (!) 59 (!) 59  Resp: 18 18  Temp: 97.7 F (36.5 C) 98 F (36.7 C)  SpO2: 95% 98%   General: Appear in mild distress, no Rash; Oral Mucosa moist. Cardiovascular: S1 and S2 Present, no Murmur, no JVD Respiratory: Bilateral Air entry present and Clear to Auscultation, no Crackles, no wheezes Abdomen: Bowel Sound present, Soft and no tenderness Extremities: no Pedal edema, no calf tenderness Neurology: Grossly no focal neuro deficit. Right sided numbness  The results of significant diagnostics from this hospitalization (including imaging, microbiology, ancillary and laboratory) are listed below for reference.    Significant Diagnostic Studies: Dg Chest 2 View  Result Date:  11/06/2017 CLINICAL DATA:  52 year old female with TIA. EXAM: CHEST - 2 VIEW COMPARISON:  Chest radiograph dated 08/14/2015 FINDINGS: The heart size and mediastinal contours are within normal limits. Both lungs are clear. The visualized skeletal structures are unremarkable. IMPRESSION: No active cardiopulmonary disease. Electronically Signed   By: Anner Crete M.D.   On: 11/06/2017 04:22   Ct Head Wo Contrast  Result Date: 11/06/2017 CLINICAL DATA:  Right-sided numbness and tingling starting the end of February. EXAM: CT HEAD WITHOUT CONTRAST TECHNIQUE: Contiguous axial images were obtained from the base of the skull through the vertex without intravenous contrast. COMPARISON:  07/14/2011 FINDINGS: Brain: No acute intracranial hemorrhage, midline shift or edema. Chronic left caudate head and a ticket are nucleus lacunar infarct. Minimal small vessel ischemic disease of periventricular white matter, chronic in appearance. No large vascular territory infarction, intra-axial mass nor extra-axial fluid collections. Chronic mild asymmetry of the lateral ventricles without evidence of hydrocephalus. Midline fourth ventricle and basal cisterns. Vascular: No hyperdense vessel sign. Skull: No acute skull fracture or suspicious osseous lesions. Sinuses/Orbits: Stable calcification in the right maxillary sinus possibly small osteoma or calcified polyp incompletely included. Intact orbits and globes. Other: None IMPRESSION: No acute intracranial abnormality. Chronic minimal small vessel ischemic disease. 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 Electronically Signed   By: Ashley Royalty M.D.   On: 11/06/2017 02:00   Mr Brain Wo Contrast  Result Date: 11/07/2017 CLINICAL DATA:  Intermittent, progressively worsening facial and RIGHT sided numbness since February. Difficulty ambulating. History of hypertension. EXAM: MRI HEAD WITHOUT CONTRAST MRA HEAD WITHOUT CONTRAST TECHNIQUE: Multiplanar,  multiecho pulse sequences of the brain and surrounding structures were obtained without intravenous contrast. Angiographic images of the head were obtained using MRA technique without contrast. COMPARISON:  CT HEAD Nov 06, 2017 FINDINGS: MRI HEAD FINDINGS INTRACRANIAL CONTENTS: No reduced diffusion to suggest acute ischemia or hyperacute demyelination. No susceptibility artifact to suggest hemorrhage. Old LEFT basal ganglia infarcts with ex vacuo dilatation LEFT lateral ventricle. Mild parenchymal brain volume loss for age. No suspicious parenchymal signal, masses, mass effect. No abnormal extra-axial fluid collections. No extra-axial masses. 8 mm pineal cyst. VASCULAR: Normal major intracranial vascular flow voids present at skull base. SKULL AND UPPER CERVICAL SPINE: No abnormal sellar expansion. No suspicious calvarial bone marrow signal. Craniocervical junction maintained. SINUSES/ORBITS: The mastoid air-cells and included paranasal sinuses are well-aerated.The included ocular globes and orbital contents are non-suspicious. OTHER: None. MRA HEAD FINDINGS-Mild motion degraded examination. ANTERIOR CIRCULATION: Normal flow related enhancement of the included cervical, petrous, cavernous and supraclinoid internal carotid arteries. Patent anterior communicating artery. Patent anterior and middle cerebral arteries, mild luminal irregularity compatible with atherosclerosis. Moderate stenosis LEFT  M2 origin, inferior division. No large vessel occlusion, aneurysm. POSTERIOR CIRCULATION: LEFT vertebral artery is dominant. Basilar artery is patent, with normal flow related enhancement of the main branch vessels. Patent posterior cerebral arteries, moderate stenosis bilateral P2 segment, severe stenosis LEFT distal P2 segment. No large vessel occlusion, aneurysm. ANATOMIC VARIANTS: None. Source images and MIP images were reviewed. IMPRESSION: MRI HEAD: 1. No acute intracranial process. 2. Old LEFT basal ganglia infarct. 3.  Mild parenchymal brain volume loss for age. MRA HEAD: 1. No emergent large vessel occlusion on this mildly motion degraded examination. 2. Advanced intracranial atherosclerosis resulting in severe stenosis LEFT P2 segment, moderate stenosis LEFT MCA and bilateral PCA. Electronically Signed   By: Elon Alas M.D.   On: 11/07/2017 03:00   Mr Cervical Spine W Wo Contrast  Result Date: 11/07/2017 CLINICAL DATA:  52 year old female with right arm weakness. EXAM: MRI CERVICAL SPINE WITHOUT AND WITH CONTRAST TECHNIQUE: Multiplanar and multiecho pulse sequences of the cervical spine, to include the craniocervical junction and cervicothoracic junction, were obtained without and with intravenous contrast. CONTRAST:  61mL MULTIHANCE GADOBENATE DIMEGLUMINE 529 MG/ML IV SOLN COMPARISON:  Brain MRI and intracranial MRA 0211 hours today. Head CTs 11/06/2017 and earlier. FINDINGS: Alignment: Straightening of cervical lordosis. No significant spondylolisthesis. Vertebrae: No marrow edema or evidence of acute osseous abnormality. Visualized bone marrow signal is within normal limits. Cord: Spinal cord signal is within normal limits at all visualized levels. No abnormal intradural enhancement. No dural thickening. Posterior Fossa, vertebral arteries, paraspinal tissues: Negative visible posterior fossa. Preserved major vascular flow voids in the neck. Negative neck soft tissues. Negative visible lung apices. Disc levels: C2-C3: Minimal disc bulge. Mild ligament flavum hypertrophy. No stenosis. C3-C4:  Minimal disc bulge and endplate spurring.  No stenosis. C4-C5:  Minimal endplate spurring.  No stenosis. C5-C6: Mild disc space loss with circumferential disc bulge and endplate spurring. Broad-based foraminal more so than posterior involvement. No spinal stenosis. Mild to moderate left and mild right C6 neural foraminal stenosis. C6-C7: Small central disc protrusion with annular fissure (series 9001, image 24). Mild ligament  flavum hypertrophy. Borderline to mild spinal stenosis. No cord mass effect. No foraminal stenosis. C7-T1:  Negative. Negative visible upper thoracic levels. IMPRESSION: 1. Overall mild for age cervical spine degeneration. No acute osseous abnormality. 2. There is a small disc herniation at C6-C7 resulting in borderline to mild spinal stenosis. No spinal cord mass effect or signal abnormality. 3. Circumferential disc and endplate degeneration at C5-C6 resulting in up to moderate left and mild right C6 neural foraminal stenosis. Electronically Signed   By: Genevie Ann M.D.   On: 11/07/2017 19:21   Mr Jodene Nam Head/brain VO Cm  Result Date: 11/07/2017 CLINICAL DATA:  Intermittent, progressively worsening facial and RIGHT sided numbness since February. Difficulty ambulating. History of hypertension. EXAM: MRI HEAD WITHOUT CONTRAST MRA HEAD WITHOUT CONTRAST TECHNIQUE: Multiplanar, multiecho pulse sequences of the brain and surrounding structures were obtained without intravenous contrast. Angiographic images of the head were obtained using MRA technique without contrast. COMPARISON:  CT HEAD Nov 06, 2017 FINDINGS: MRI HEAD FINDINGS INTRACRANIAL CONTENTS: No reduced diffusion to suggest acute ischemia or hyperacute demyelination. No susceptibility artifact to suggest hemorrhage. Old LEFT basal ganglia infarcts with ex vacuo dilatation LEFT lateral ventricle. Mild parenchymal brain volume loss for age. No suspicious parenchymal signal, masses, mass effect. No abnormal extra-axial fluid collections. No extra-axial masses. 8 mm pineal cyst. VASCULAR: Normal major intracranial vascular flow voids present at skull base. SKULL AND UPPER  CERVICAL SPINE: No abnormal sellar expansion. No suspicious calvarial bone marrow signal. Craniocervical junction maintained. SINUSES/ORBITS: The mastoid air-cells and included paranasal sinuses are well-aerated.The included ocular globes and orbital contents are non-suspicious. OTHER: None. MRA  HEAD FINDINGS-Mild motion degraded examination. ANTERIOR CIRCULATION: Normal flow related enhancement of the included cervical, petrous, cavernous and supraclinoid internal carotid arteries. Patent anterior communicating artery. Patent anterior and middle cerebral arteries, mild luminal irregularity compatible with atherosclerosis. Moderate stenosis LEFT M2 origin, inferior division. No large vessel occlusion, aneurysm. POSTERIOR CIRCULATION: LEFT vertebral artery is dominant. Basilar artery is patent, with normal flow related enhancement of the main branch vessels. Patent posterior cerebral arteries, moderate stenosis bilateral P2 segment, severe stenosis LEFT distal P2 segment. No large vessel occlusion, aneurysm. ANATOMIC VARIANTS: None. Source images and MIP images were reviewed. IMPRESSION: MRI HEAD: 1. No acute intracranial process. 2. Old LEFT basal ganglia infarct. 3. Mild parenchymal brain volume loss for age. MRA HEAD: 1. No emergent large vessel occlusion on this mildly motion degraded examination. 2. Advanced intracranial atherosclerosis resulting in severe stenosis LEFT P2 segment, moderate stenosis LEFT MCA and bilateral PCA. Electronically Signed   By: Elon Alas M.D.   On: 11/07/2017 03:00    Microbiology: No results found for this or any previous visit (from the past 240 hour(s)).   Labs: CBC: Recent Labs  Lab 11/06/17 0004  WBC 6.4  NEUTROABS 2.8  HGB 12.2  HCT 36.1  MCV 88.0  PLT 161   Basic Metabolic Panel: Recent Labs  Lab 11/06/17 0004  NA 139  K 3.6  CL 104  CO2 27  GLUCOSE 94  BUN 12  CREATININE 0.75  CALCIUM 9.9   Liver Function Tests: Recent Labs  Lab 11/06/17 0004  AST 22  ALT 25  ALKPHOS 72  BILITOT 0.7  PROT 8.3*  ALBUMIN 4.0   No results for input(s): LIPASE, AMYLASE in the last 168 hours. No results for input(s): AMMONIA in the last 168 hours. Cardiac Enzymes: Recent Labs  Lab 11/06/17 0004  TROPONINI <0.03   BNP (last 3  results) No results for input(s): BNP in the last 8760 hours. CBG: No results for input(s): GLUCAP in the last 168 hours. Time spent: 35 minutes  Signed:  Berle Mull  [facility]  11/08/2017  , 1:32 PM

## 2017-11-08 NOTE — Progress Notes (Signed)
*  PRELIMINARY RESULTS* Vascular Ultrasound Carotid Duplex (Doppler) has been completed.   Findings suggest 1-39% internal carotid artery stenosis bilaterally. Vertebral arteries are patent with antegrade flow.  11/08/2017 11:21 AM Maudry Mayhew, BS, RVT, RDCS, RDMS

## 2017-11-08 NOTE — Progress Notes (Signed)
STROKE TEAM PROGRESS NOTE   HISTORY OF PRESENT ILLNESS (per record) Barbara Snow is an 52 y.o. female with COPD and HTN who presented to the ER back in Feb with complaint of right face, arm and leg numbness. No CTH was done at that time and she tells me she was sent home with diagnosis of 'tendonitis". The symptoms have persisted since that time and seem to wax/wane. On 5/10, she tells me she thought symptoms may be worse than usual for some reason that day and simply has gotten tired of feeling this way, so she came back to the ER. She was admitted and neurology consulted for stroke work up    [facility] ([facility]) Her husband is at the bedside.   Patient's history of present illness reviewed with them. She is had recurrent episode of transient fleeting numbness involving the right cheek than involving the right upper and lower extremity followed or preceded by headaches. She does have long-standing history of migraine headaches. She does admit that she works a very stressful job and has long hours. MRI scan of the brain shows no evidence of stroke and MRI scan cervical spine shows only minor disc bulge at C5-6 without significant compression.   OBJECTIVE Temp:  [97.7 F (36.5 C)-98.6 F (37 C)] 98 F (36.7 C) (05/13 0805) Pulse Rate:  [59-68] 59 (05/13 0805) Cardiac Rhythm: Normal sinus rhythm (05/13 0800) Resp:  [18-20] 18 (05/13 0805) BP: (122-150)/(70-92) 136/92 (05/13 0805) SpO2:  [95 %-100 %] 98 % (05/13 0805)  CBC:  Recent Labs  Lab 11/06/17 0004  WBC 6.4  NEUTROABS 2.8  HGB 12.2  HCT 36.1  MCV 88.0  PLT 509    Basic Metabolic Panel:  Recent Labs  Lab 11/06/17 0004  NA 139  K 3.6  CL 104  CO2 27  GLUCOSE 94  BUN 12  CREATININE 0.75  CALCIUM 9.9    Lipid Panel:     Component Value Date/Time   CHOL 157 11/06/2017 0537   TRIG 100 11/06/2017 0537   HDL 40 (L) 11/06/2017 0537   CHOLHDL 3.9 11/06/2017 0537   VLDL 20 11/06/2017 0537   LDLCALC 97  11/06/2017 0537   HgbA1c:  Lab Results  Component Value Date   HGBA1C 5.0 11/06/2017   Urine Drug Screen:     Component Value Date/Time   LABOPIA NONE DETECTED 11/06/2017 0000   COCAINSCRNUR NONE DETECTED 11/06/2017 0000   LABBENZ NONE DETECTED 11/06/2017 0000   AMPHETMU NONE DETECTED 11/06/2017 0000   THCU NONE DETECTED 11/06/2017 0000   LABBARB NONE DETECTED 11/06/2017 0000    Alcohol Level     Component Value Date/Time   ETH 31 (H) 11/06/2017 0004    IMAGING  Dg Chest 2 View 11/06/2017 IMPRESSION:  No active cardiopulmonary disease.    Ct Head Wo Contrast 11/06/2017 IMPRESSION:  No acute intracranial abnormality. Chronic minimal small vessel ischemic disease. 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    Mr Jodene Nam Head/brain Wo Cm 11/07/2017 IMPRESSION:   MRI HEAD:  1. No acute intracranial process.  2. Old LEFT basal ganglia infarct.  3. Mild parenchymal brain volume loss for age.   MRA HEAD:  1. No emergent large vessel occlusion on this mildly motion degraded examination.  2. Advanced intracranial atherosclerosis resulting in severe stenosis LEFT P2 segment, moderate stenosis LEFT MCA and bilateral PCA.    Transthoracic Echocardiogram  11/06/2017 Study Conclusions - Left ventricle: The cavity size was normal. Wall thickness  was   normal. Systolic function was normal. The estimated ejection   fraction was in the range of 60% to 65%. Wall motion was normal;   there were no regional wall motion abnormalities. Indeterminate   diastolic function. - Aortic valve: Mildly calcified annulus. Trileaflet. - Mitral valve: There was trivial regurgitation. - Right atrium: Central venous pressure (est): 3 mm Hg. - Atrial septum: No defect or patent foramen ovale was identified. - Tricuspid valve: There was trivial regurgitation. - Pulmonary arteries: Systolic pressure could not be accurately   estimated. - Pericardium, extracardiac: There  was no pericardial effusion.   Bilateral Carotid Dopplers - 1-39% internal carotid artery stenosis bilaterally. Vertebral arteries are patent with antegrade flow.   2DEcho : Left ventricle: The cavity size was normal. Wall thickness was normal. Systolic function was normal. The estimated ejection fraction was in the range of 60% to 65%. Wall motion was normal; there were no regional wall motion abnormalities.        PHYSICAL EXAM Vitals:   11/07/17 2008 11/08/17 0041 11/08/17 0531 11/08/17 0805  BP: (!) 143/77 122/78 125/70 (!) 136/92  Pulse: 68 68 (!) 59 (!) 59  Resp: 20 18 18 18   Temp: 98.1 F (36.7 C) 98 F (36.7 C) 97.7 F (36.5 C) 98 F (36.7 C)  TempSrc: Oral Oral Oral Oral  SpO2: 100% 96% 95% 98%  Weight:      Height:        General -  Heart - Regular rate and rhythm - no murmer appreciated Lungs - Clear to auscultation anteriorly Abdomen - Soft - non tender Extremities - Distal pulses intact - no edema Skin - Warm and dry  Mental Status: Alert, oriented, thought content appropriate.  Speech fluent without evidence of aphasia.  Able to follow 3 step commands without difficulty. Cranial Nerves: CN2-12 intact  Motor: RUE - 5/5    LUE - 5/5 RLE - 5/5    LLE -  5/5 Tone and bulk:normal tone throughout; no atrophy noted Sensory: decreased to Light touch on R but splits midline Deep Tendon Reflexes: 2+ and symmetric throughout Plantars: DOWN   Cerebellar: normal finger-to-nose, normal rapid alternating movements and normal heel-to-shin test Gait: not tested    ASSESSMENT/PLAN Barbara Snow is a 52 y.o. female with history of hypertension, COPD with ongoing tobacco use, and history of renal calculi  presenting with right-sided numbness. She did not receive IV t-PA due to late presentation. Recurrent episodes of transient right face and body paresthesias of unclear etiology possibly complicated migraine versus underlying stress Negative work cup for acute  stroke or significant compressive cervical spine disease.  :  Old left basal ganglia infarct.- possibly due to small vessel disease  Resultant R weakness   CT head - No acute intracranial abnormality.  MRI head - no acute intracranial process. Old left basal ganglia infarct.  MRA head - severe stenosis LEFT P2 segment, moderate stenosis LEFT MCA and bilateral PCA.   Carotid Doppler - pending  MRI Cspine pending   2D Echo - EF 60-65%. No cardiac source of emboli identified.  LDL - 97  HgbA1c - 5.0  VTE prophylaxis - Lovenox   Diet Order           Diet - low sodium heart healthy        Diet Heart Room service appropriate? Yes; Fluid consistency: Thin  Diet effective now          No antithrombotic prior to admission,  now on No antithrombotic - start ASA 81 mg daily  Patient counseled to be compliant with her antithrombotic medications  Ongoing aggressive stroke risk factor management  Therapy recommendations:  HH PT recommended  Disposition:  Pending  Hypertension  Blood pressure tends to run high . Long-term BP goal normotensive  Hyperlipidemia  Lipid lowering medication PTA:  none  LDL 97, goal < 70  Current lipid lowering medication: now on Lipitor 40 mg daily  Continue statin at discharge   Other Stroke Risk Factors  Cigarette smoker - advised to stop smoking  ETOH use, advised to drink no more than 1 alcoholic beverage per day.  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    Plan / Recommendations   Stroke workup:completed.  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 Functional weakness? Might need psychiatry consult   Therapy Follow Up: home health physical therapy recommended  Disposition:  pending  Antiplatelet / Anticoagulation: start aspirin 81 mg daily .  Statin: continue Lipitor 40 mg daily  MD Follow Up: [facility] - Dr Leonie Man / Janett Billow, nurse practitioner in 6 to 8 weeks  Other: pending  Further risk factor modification per primary care MD: Follow Up 2 weeks   Hospital day # 0  I have personally examined this patient, reviewed notes, independently viewed imaging studies, participated in medical decision making and plan of care.ROS completed by me personally and pertinent positives fully documented  I have made any additions or clarifications directly to the above note.  Long discussion with the patient and husband at the bedside and answered questions. Recommend trial of Topamax 50 mg at night for 1 week increase to twice daily to help with paresthesias and migraine prophylaxis. Patient is also encouraged to increase participation in regular activities for stress relaxation. Discuss with Dr. Posey Pronto. Greater than 50% time during this 25 minute visit was spent on counseling and coordination of care about episodes of paresthesias, complicated migraine and stress relaxation techniques discussion.  Antony Contras, MD Medical Director [facility] Pager: [phone] 11/08/2017 1:47 PM    To contact Stroke Continuity provider, please refer to [email or web address] After hours, contact General Neurology

## 2017-11-08 NOTE — Progress Notes (Signed)
Occupational Therapy Treatment Patient Details Name: Barbara Snow MRN: [id number] DOB: [birth year] Today's Date: 11/08/2017    History of present illness 52 y.o.femalewith medical history significant ofCOPD, hypertension, urolithiasis who is coming to the emergency department due to worsening facial and right side extremities numbnessthat she has had intermittently since February. MRI negative.   OT comments  Educated pt on AROM exercises for the RUE.  Noted multiple inconsistencies with UE movement in the right arm.  When assisting with AAROM for shoulder flexion pt could not flex past 90 degrees without therapist assist, but then exhibited resistance with shoulder extension with gravity assisting.  She could not complete digit abduction even though full grasp and release is present as well as isolated finger to thumb movements.  Pt moves slowly with all attempts as to demonstrate weakness, but this is more likely psychological than physical.  Will continue to follow in acute care.   Follow Up Recommendations  No OT follow up    Equipment Recommendations  None recommended by OT    Recommendations for Other Services PT consult    Precautions / Restrictions Precautions Precautions: Fall Restrictions Weight Bearing Restrictions: No       Mobility Bed Mobility                  Transfers                      Balance Overall balance assessment: Needs assistance Sitting-balance support: No upper extremity supported;Feet supported Sitting balance-Leahy Scale: Normal     Standing balance support: No upper extremity supported Standing balance-Leahy Scale: Fair Standing balance comment: Pt needed min assist for sit to stand initially but then stood for UE exercises with supervision.                             ADL either performed or assessed with clinical judgement   ADL Overall ADL's : Needs assistance/impaired                                                        Cognition Arousal/Alertness: Awake/alert Behavior During Therapy: Flat affect Overall Cognitive Status: Within Functional Limits for tasks assessed                                          Exercises General Exercises - Upper Extremity Shoulder Flexion: 10 reps;Standing;Both;AROM Shoulder ADduction: AAROM;10 reps;Seated;Right Elbow Flexion: 10 reps;Left;Seated Elbow Extension: 10 reps;Seated;AROM;Left Wrist Flexion: AROM;10 reps;Right Wrist Extension: AROM;10 reps;Right Digit Composite Flexion: AROM;10 reps;Right;Seated Composite Extension: AROM;10 reps;Right;Seated           Pertinent Vitals/ Pain       Pain Assessment: No/denies pain         Frequency  Min 2X/week        Progress Toward Goals  OT Goals(current goals can now be found in the care plan section)  Progress towards OT goals: Progressing toward goals  Acute Rehab OT Goals OT Goal Formulation: With patient Potential to Achieve Goals: Good  Plan         AM-PAC PT "6 Clicks" Daily Activity     Outcome Measure  Help from another person eating meals?: None Help from another person taking care of personal grooming?: A Little Help from another person toileting, which includes using toliet, bedpan, or urinal?: A Little Help from another person bathing (including washing, rinsing, drying)?: A Little Help from another person to put on and taking off regular upper body clothing?: A Little Help from another person to put on and taking off regular lower body clothing?: A Little 6 Click Score: 19    End of Session    OT Visit Diagnosis: Unsteadiness on feet (R26.81);Other abnormalities of gait and mobility (R26.89);Muscle weakness (generalized) (M62.81);Other symptoms and signs involving cognitive function;Hemiplegia and hemiparesis Hemiplegia - Right/Left: Right Hemiplegia - dominant/non-dominant: Dominant Hemiplegia - caused by: Unspecified    Activity Tolerance Patient tolerated treatment well   Patient Left in bed;with call bell/phone within reach;with bed alarm set   Nurse Communication          Time: 0223-3612 OT Time Calculation (min): 25 min  Charges: OT General Charges $OT Visit: 1 Visit OT Treatments $Therapeutic Exercise: 23-37 mins    Nakul Avino OTR/L 11/08/2017, 9:46 AM

## 2017-11-08 NOTE — Progress Notes (Signed)
Patient discharged home with spouse. Discharge information given. IV and telemetry discontinued. Patient questions asked and answered. Patient transported from unit via wheelchair with volunteer services. Barbara Snow

## 2017-11-08 NOTE — Care Management Note (Signed)
Case Management Note  Patient Details  Name: Barbara Snow MRN: [id number] Date of Birth: 27-Oct-1965  Subjective/Objective:  Pt in with TIA. She is from home with her fiance.                Action/Plan: Pt discharging home with orders for [facility] services. Pt does not have insurance. CM notified Butch Penny with [facility] to see if patient will qualify for charity [facility]. Pt also does not have PCP listed. Pt has been going to Dr Abran Cantor at the [facility].  Pt discharging on 3 new meds. Both the Topamax and Lipitor are $9 at [facility], the ASA is over the counter. Pt made aware.  Pt has transportation home.   Expected Discharge Date:  11/08/17               Expected Discharge Plan:  [place]  In-House Referral:     Discharge planning Services  CM Consult  Post Acute Care Choice:  Home Health Choice offered to:  Patient  DME Arranged:    DME Agency:     HH Arranged:  PT, OT HH Agency:  [facility]  Status of Service:  Completed, signed off  If discussed at [place] of Stay Meetings, dates discussed:    Additional Comments:  Pollie Friar, RN 11/08/2017, 12:15 PM

## 2017-11-29 ENCOUNTER — Encounter: Payer: Self-pay | Admitting: Physician Assistant

## 2017-11-29 ENCOUNTER — Ambulatory Visit: Payer: Self-pay | Admitting: Physician Assistant

## 2017-11-29 VITALS — BP 142/88 | HR 78 | Temp 97.9°F | Ht 63.75 in | Wt 269.2 lb

## 2017-11-29 DIAGNOSIS — I672 Cerebral atherosclerosis: Secondary | ICD-10-CM

## 2017-11-29 DIAGNOSIS — Z1211 Encounter for screening for malignant neoplasm of colon: Secondary | ICD-10-CM

## 2017-11-29 DIAGNOSIS — I10 Essential (primary) hypertension: Secondary | ICD-10-CM

## 2017-11-29 DIAGNOSIS — Z7689 Persons encountering health services in other specified circumstances: Secondary | ICD-10-CM

## 2017-11-29 DIAGNOSIS — F39 Unspecified mood [affective] disorder: Secondary | ICD-10-CM

## 2017-11-29 DIAGNOSIS — Z1239 Encounter for other screening for malignant neoplasm of breast: Secondary | ICD-10-CM

## 2017-11-29 MED ORDER — AMLODIPINE BESYLATE 5 MG PO TABS
5.0000 mg | ORAL_TABLET | Freq: Every day | ORAL | 1 refills | Status: DC
Start: 2017-11-29 — End: 2017-12-29

## 2017-11-29 MED ORDER — ATORVASTATIN CALCIUM 40 MG PO TABS: 40.0000 mg | ORAL_TABLET | Freq: Every day | ORAL | 1 refills | Status: DC

## 2017-11-29 NOTE — Progress Notes (Signed)
BP (!) 142/88 (BP Location: Left Arm, Patient Position: Sitting, Cuff Size: Large)   Pulse 78   Temp 97.9 F (36.6 C)   Ht 5' 3.75" (1.619 m)   Wt 269 lb 4 oz (122.1 kg)   SpO2 96%   BMI 46.58 kg/m    Subjective:    Patient ID: Barbara Snow, female    DOB: 1966/01/10, 52 y.o.   MRN: [id number]  HPI: Barbara Snow is a 52 y.o. female presenting on 11/29/2017 for New Patient (Initial Visit) (pt states she went to [facility] early May, 2019 and was sent to [facility] and was told she had minor stroke. pt states she has been experiencing HA since before she went to ER. pt states she is also having numbness on R side face, R arm, and R leg. pt states she feels like :"pins sticking her R side lip and R fingers")   HPI  Chief Complaint  Patient presents with  . New Patient (Initial Visit)    pt states she went to [facility] early May, 2019 and was sent to [facility] and was told she had minor stroke. pt states she has been experiencing HA since before she went to ER. pt states she is also having numbness on R side face, R arm, and R leg. pt states she feels like :"pins sticking her R side lip and R fingers"    Pt's fiance is with her today.  Recent MRA- showed advanced intracranial atherosclerosis resulting in severe stenosis of vessels  Seen by neurology while in the hospital.  He stated that recurrent episodes of transient right face and body paresthias unclear etiology- possbily complicated migraine versus underlying stress.   Recommended aspirin.  Consider psych/counseling due to stress.   Was Told to follow up with Dr Leonie Man .  She has appointment on 12/20/17.  Also added lipitor during hospitalization  Pt was seen at [facility] since she was discharged from the [facility].    The provider there stopped the lisinopril-hctz and started her on dyazide.   She says the provider also stopped the topamax.    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  She is still stressed about the  people who live there even though she isn't working currently.  She has been out of work since her recent hospitalization  She is still having the numbness and tingling in face R arm and R leg  She is still participating in physical theray  Pt was given [facility] care application while in the hospital but she did not turn it in yet  She says her last Mammogram was years ago  She says she hasn't smoked since she was in the hospital in May.  Relevant past medical, surgical, family and social history reviewed and updated as indicated. Interim medical history since our last visit reviewed. Allergies and medications reviewed and updated.   Current Outpatient Medications:  .  aspirin EC 81 MG EC tablet, Take 1 tablet (81 mg total) by mouth daily., Disp: 120 tablet, Rfl: 0 .  atorvastatin (LIPITOR) 40 MG tablet, Take 1 tablet (40 mg total) by mouth daily at 6 PM., Disp: 30 tablet, Rfl: 0 .  triamterene-hydrochlorothiazide (DYAZIDE) 37.5-25 MG capsule, Take 1 capsule by mouth daily., Disp: , Rfl:    Review of Systems  Constitutional: Positive for diaphoresis. Negative for appetite change, chills, fatigue, fever and unexpected weight change.  HENT: Positive for trouble swallowing. Negative for congestion, dental problem, drooling, ear pain,  facial swelling, hearing loss, mouth sores, sneezing, sore throat and voice change.   Eyes: Positive for pain. Negative for discharge, redness, itching and visual disturbance.  Respiratory: Positive for shortness of breath. Negative for cough, choking and wheezing.   Cardiovascular: Negative for chest pain, palpitations and leg swelling.  Gastrointestinal: Negative for abdominal pain, blood in stool, constipation, diarrhea and vomiting.  Endocrine: Negative for cold intolerance, heat intolerance and polydipsia.  Genitourinary: Negative for decreased urine volume, dysuria and hematuria.  Musculoskeletal: Positive for gait problem. Negative for arthralgias  and back pain.  Skin: Negative for rash.  Allergic/Immunologic: Negative for environmental allergies.  Neurological: Positive for headaches. Negative for seizures, syncope and light-headedness.  Hematological: Negative for adenopathy.  Psychiatric/Behavioral: Negative for agitation, dysphoric mood and suicidal ideas. The patient is not nervous/anxious.     Per HPI unless specifically indicated above     Objective:    BP (!) 142/88 (BP Location: Left Arm, Patient Position: Sitting, Cuff Size: Large)   Pulse 78   Temp 97.9 F (36.6 C)   Ht 5' 3.75" (1.619 m)   Wt 269 lb 4 oz (122.1 kg)   SpO2 96%   BMI 46.58 kg/m   Wt Readings from Last 3 Encounters:  11/29/17 269 lb 4 oz (122.1 kg)  11/05/17 230 lb (104.3 kg)  08/26/17 230 lb (104.3 kg)    Physical Exam  Constitutional: She is oriented to person, place, and time. She appears well-developed and well-nourished.  HENT:  Head: Normocephalic and atraumatic.  Mouth/Throat: Oropharynx is clear and moist.  Pt won't fully open mouth  Eyes: Pupils are equal, round, and reactive to light. Conjunctivae and EOM are normal.  Neck: Neck supple. No thyromegaly present.  Cardiovascular: Normal rate and regular rhythm.  Pulmonary/Chest: Effort normal and breath sounds normal.  Abdominal: Soft. Bowel sounds are normal. She exhibits no mass. There is no hepatosplenomegaly. There is no tenderness.  Musculoskeletal: She exhibits no edema.  Lymphadenopathy:    She has no cervical adenopathy.  Neurological: She is alert and oriented to person, place, and time. She displays no tremor. Gait abnormal.  Pt has weakness R grip and RUE.   She walks slowly with a cane.  She requires assistance from her fiance to get up and down from the exam table.   Skin: Skin is warm and dry.  Psychiatric: She has a normal mood and affect. Her behavior is normal.  Vitals reviewed.       Assessment & Plan:   Encounter Diagnoses  Name Primary?  . Encounter to  establish care Yes  . Essential hypertension   . Mood disorder ([place])   . Screening for colon cancer   . Screening for breast cancer   . Intracranial atherosclerosis     -Add amlodipine. Continue dyazide. -pt to Go to [facility] for counseling and help with her stress and her depression over her recent medication issues -pt counseled to Make sure to turn in [employer] care application -pt to Continue atorvastatin and aspirin.  -signed up for [facility] for atorvastatin  (will send bp meds to [facility] when the bp is controlled) -Pt to Continue Physical Therapy -pt to go to Neurology appointment as scheduled -iFOBT given for colon cancer screening -screening mammogram was ordered -no additional blood tests are needed at this time -pt encouraged to continue to avoid smoking -pt to follow up 1 month.  RTO sooner prn

## 2017-12-02 ENCOUNTER — Other Ambulatory Visit: Payer: Self-pay | Admitting: Physician Assistant

## 2017-12-02 MED ORDER — ATORVASTATIN CALCIUM 40 MG PO TABS: 40.0000 mg | ORAL_TABLET | Freq: Every day | ORAL | 1 refills | Status: DC

## 2017-12-05 ENCOUNTER — Other Ambulatory Visit: Payer: Self-pay | Admitting: Physician Assistant

## 2017-12-05 DIAGNOSIS — Z1211 Encounter for screening for malignant neoplasm of colon: Secondary | ICD-10-CM

## 2017-12-05 DIAGNOSIS — I672 Cerebral atherosclerosis: Secondary | ICD-10-CM | POA: Insufficient documentation

## 2017-12-07 ENCOUNTER — Other Ambulatory Visit: Payer: Self-pay | Admitting: Physician Assistant

## 2017-12-07 DIAGNOSIS — Z1231 Encounter for screening mammogram for malignant neoplasm of breast: Secondary | ICD-10-CM

## 2017-12-20 ENCOUNTER — Ambulatory Visit: Payer: Self-pay | Admitting: Adult Health

## 2017-12-29 ENCOUNTER — Encounter: Payer: Self-pay | Admitting: Physician Assistant

## 2017-12-29 ENCOUNTER — Ambulatory Visit: Payer: Self-pay | Admitting: Physician Assistant

## 2017-12-29 VITALS — BP 140/80 | HR 55 | Temp 97.9°F | Ht 63.5 in | Wt 273.0 lb

## 2017-12-29 DIAGNOSIS — Z5181 Encounter for therapeutic drug level monitoring: Secondary | ICD-10-CM

## 2017-12-29 DIAGNOSIS — F39 Unspecified mood [affective] disorder: Secondary | ICD-10-CM

## 2017-12-29 DIAGNOSIS — I672 Cerebral atherosclerosis: Secondary | ICD-10-CM

## 2017-12-29 DIAGNOSIS — G459 Transient cerebral ischemic attack, unspecified: Secondary | ICD-10-CM

## 2017-12-29 DIAGNOSIS — I10 Essential (primary) hypertension: Secondary | ICD-10-CM

## 2017-12-29 MED ORDER — AMLODIPINE BESYLATE 10 MG PO TABS: 10.0000 mg | ORAL_TABLET | Freq: Every day | ORAL | 1 refills | Status: DC

## 2017-12-29 MED ORDER — LISINOPRIL 10 MG PO TABS: 10.0000 mg | ORAL_TABLET | Freq: Every day | ORAL | 1 refills | Status: DC

## 2017-12-29 MED ORDER — HYDROCHLOROTHIAZIDE 25 MG PO TABS: 25.0000 mg | ORAL_TABLET | Freq: Every day | ORAL | 1 refills | Status: DC

## 2017-12-29 NOTE — Progress Notes (Signed)
BP 140/80 (BP Location: Left Arm, Patient Position: Sitting, Cuff Size: Large)   Pulse (!) 55   Temp 97.9 F (36.6 C)   Ht 5' 3.5" (1.613 m)   Wt 273 lb (123.8 kg)   SpO2 99%   BMI 47.60 kg/m    Subjective:    Patient ID: Barbara Snow, female    DOB: 04/03/1966, 52 y.o.   MRN: [id number]  HPI: Barbara Snow is a 52 y.o. female presenting on 12/29/2017 for Follow-up   HPI  Pt turned in her [facility] application  Pt did not contact [facility] for her anxiety issues  Pt still abstaining from smoking  Pt cancelled her appointment with neurology that was scheduled for 6/24.  She says she still has tingling face.   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   Pt states no new problems but she is still having the tingling in her face.   Relevant past medical, surgical, family and social history reviewed and updated as indicated. Interim medical history since our last visit reviewed. Allergies and medications reviewed and updated.   Current Outpatient Medications:  .  amLODipine (NORVASC) 5 MG tablet, Take 1 tablet (5 mg total) by mouth daily., Disp: 30 tablet, Rfl: 1 .  aspirin EC 81 MG EC tablet, Take 1 tablet (81 mg total) by mouth daily., Disp: 120 tablet, Rfl: 0 .  atorvastatin (LIPITOR) 40 MG tablet, Take 1 tablet (40 mg total) by mouth at bedtime., Disp: 90 tablet, Rfl: 1 .  hydrOXYzine (ATARAX/VISTARIL) 25 MG tablet, Take 25 mg by mouth 3 (three) times daily as needed (rest)., Disp: , Rfl:  .  triamterene-hydrochlorothiazide (DYAZIDE) 37.5-25 MG capsule, Take 1 capsule by mouth daily., Disp: , Rfl:    Review of Systems  Constitutional: Positive for diaphoresis. Negative for appetite change, chills, fatigue, fever and unexpected weight change.  HENT: Positive for sore throat. Negative for congestion, dental problem, drooling, ear pain, facial swelling, hearing loss, mouth sores, sneezing, trouble swallowing and voice change.   Eyes: Positive for pain and visual  disturbance. Negative for discharge, redness and itching.  Respiratory: Positive for shortness of breath. Negative for cough, choking and wheezing.   Cardiovascular: Positive for leg swelling. Negative for chest pain and palpitations.  Gastrointestinal: Negative for abdominal pain, blood in stool, constipation, diarrhea and vomiting.  Endocrine: Positive for heat intolerance. Negative for cold intolerance and polydipsia.  Genitourinary: Negative for decreased urine volume, dysuria and hematuria.  Musculoskeletal: Positive for gait problem. Negative for arthralgias and back pain.  Skin: Negative for rash.  Allergic/Immunologic: Negative for environmental allergies.  Neurological: Positive for headaches. Negative for seizures, syncope and light-headedness.  Hematological: Negative for adenopathy.  Psychiatric/Behavioral: Negative for agitation, dysphoric mood and suicidal ideas. The patient is not nervous/anxious.     Per HPI unless specifically indicated above     Objective:    BP 140/80 (BP Location: Left Arm, Patient Position: Sitting, Cuff Size: Large)   Pulse (!) 55   Temp 97.9 F (36.6 C)   Ht 5' 3.5" (1.613 m)   Wt 273 lb (123.8 kg)   SpO2 99%   BMI 47.60 kg/m   Wt Readings from Last 3 Encounters:  12/29/17 273 lb (123.8 kg)  11/29/17 269 lb 4 oz (122.1 kg)  11/05/17 230 lb (104.3 kg)    Physical Exam  Constitutional: She is oriented to person, place, and time. She appears well-developed and well-nourished.  HENT:  Head: Normocephalic and  atraumatic.  Neck: Neck supple.  Cardiovascular: Normal rate and regular rhythm.  Pulmonary/Chest: Effort normal and breath sounds normal.  Abdominal: Soft. Bowel sounds are normal. She exhibits no mass. There is no hepatosplenomegaly. There is no tenderness.  Musculoskeletal: She exhibits no edema.  Lymphadenopathy:    She has no cervical adenopathy.  Neurological: She is alert and oriented to person, place, and time.  Skin: Skin  is warm and dry.  Psychiatric: She has a normal mood and affect. Her behavior is normal.  Vitals reviewed.       Assessment & Plan:    Encounter Diagnoses  Name Primary?  . Essential hypertension Yes  . Intracranial atherosclerosis   . TIA (transient ischemic attack)   . Mood disorder ([place])     -pt is encouraged to Reschedule neurology appointment -will discontinue the dyazide since it is unavailable through [facility] (medication assistance program) and pt has difficulty paying for her medications -Increase amlodipine to 10mg  -Continue hctz 25mg  -Add lisinopril 10mg  qd -congratulated pt on continuing to stay off smoking.  Encouraged her to stay off the cigarettes -pt to follow up 1 month to recheck bp.  RTO sooner prn

## 2018-01-31 ENCOUNTER — Ambulatory Visit: Payer: Self-pay | Admitting: Physician Assistant

## 2018-03-21 ENCOUNTER — Other Ambulatory Visit (HOSPITAL_COMMUNITY)
Admission: RE | Admit: 2018-03-21 | Discharge: 2018-03-21 | Disposition: A | Discharge status: Home / Self Care | Payer: Self-pay | Source: Ambulatory Visit | Attending: Physician Assistant | Admitting: Physician Assistant

## 2018-03-21 ENCOUNTER — Encounter: Payer: Self-pay | Admitting: Physician Assistant

## 2018-03-21 ENCOUNTER — Ambulatory Visit: Payer: Self-pay | Admitting: Physician Assistant

## 2018-03-21 VITALS — BP 144/86 | HR 60 | Temp 97.7°F | Ht 63.5 in | Wt 274.5 lb

## 2018-03-21 DIAGNOSIS — I672 Cerebral atherosclerosis: Secondary | ICD-10-CM

## 2018-03-21 DIAGNOSIS — G459 Transient cerebral ischemic attack, unspecified: Secondary | ICD-10-CM

## 2018-03-21 DIAGNOSIS — I10 Essential (primary) hypertension: Secondary | ICD-10-CM

## 2018-03-21 DIAGNOSIS — Z9119 Patient's noncompliance with other medical treatment and regimen: Secondary | ICD-10-CM

## 2018-03-21 DIAGNOSIS — Z91199 Patient's noncompliance with other medical treatment and regimen due to unspecified reason: Secondary | ICD-10-CM

## 2018-03-21 DIAGNOSIS — F17201 Nicotine dependence, unspecified, in remission: Secondary | ICD-10-CM

## 2018-03-21 DIAGNOSIS — Z5181 Encounter for therapeutic drug level monitoring: Secondary | ICD-10-CM | POA: Insufficient documentation

## 2018-03-21 DIAGNOSIS — E785 Hyperlipidemia, unspecified: Secondary | ICD-10-CM

## 2018-03-21 LAB — COMPREHENSIVE METABOLIC PANEL
ALT: 20 U/L (ref 0–44)
AST: 18 U/L (ref 15–41)
Albumin: 3.7 g/dL (ref 3.5–5.0)
Alkaline Phosphatase: 74 U/L (ref 38–126)
Anion gap: 8 (ref 5–15)
BUN: 10 mg/dL (ref 6–20)
CHLORIDE: 103 mmol/L (ref 98–111)
CO2: 27 mmol/L (ref 22–32)
CREATININE: 0.81 mg/dL (ref 0.44–1.00)
Calcium: 9.2 mg/dL (ref 8.9–10.3)
GFR calc Af Amer: >60 mL/min (ref >60)
Glucose, Bld: 99 mg/dL (ref 70–99)
POTASSIUM: 3.4 mmol/L — AB (ref 3.5–5.1)
Sodium: 138 mmol/L (ref 135–145)
Total Bilirubin: 1.4 mg/dL — ABNORMAL HIGH (ref 0.3–1.2)
Total Protein: 7.8 g/dL (ref 6.5–8.1)

## 2018-03-21 LAB — LIPID PANEL
CHOLESTEROL: 116 mg/dL (ref 0–200)
HDL: 40 mg/dL — AB (ref >40)
LDL Cholesterol: 62 mg/dL (ref 0–99)
TRIGLYCERIDES: 69 mg/dL (ref <150)
Total CHOL/HDL Ratio: 2.9 RATIO
VLDL: 14 mg/dL (ref 0–40)

## 2018-03-21 MED ORDER — LISINOPRIL 10 MG PO TABS: 10.0000 mg | ORAL_TABLET | Freq: Every day | ORAL | 0 refills | Status: DC

## 2018-03-21 MED ORDER — ATORVASTATIN CALCIUM 40 MG PO TABS: 40.0000 mg | ORAL_TABLET | Freq: Every day | ORAL | 0 refills | Status: DC

## 2018-03-21 MED ORDER — AMLODIPINE BESYLATE 10 MG PO TABS: 10.0000 mg | ORAL_TABLET | Freq: Every day | ORAL | 0 refills | Status: DC

## 2018-03-21 MED ORDER — HYDROCHLOROTHIAZIDE 25 MG PO TABS: 25.0000 mg | ORAL_TABLET | Freq: Every day | ORAL | 0 refills | Status: DC

## 2018-03-21 NOTE — Patient Instructions (Signed)
Financial Counselor- [phone]  

## 2018-03-21 NOTE — Progress Notes (Signed)
BP (!) 144/86 (BP Location: Right Arm, Patient Position: Sitting, Cuff Size: Large)   Pulse 60   Temp 97.7 F (36.5 C)   Ht 5' 3.5" (1.613 m)   Wt 274 lb 8 oz (124.5 kg)   SpO2 95%   BMI 47.86 kg/m    Subjective:    Patient ID: Barbara Snow, female    DOB: Oct 05, 1965, 52 y.o.   MRN: [id number]  HPI: Barbara Snow is a 52 y.o. female presenting on 03/21/2018 for Follow-up   HPI   Pt was scheduled to follow up 1 1/2 months ago but she cancelled the appointment.     Pt is out of all of her meds except she has 1 tablet of lipitor left.   Pt has labs ordered that she has not gotten done  Pt has still not rescheduled her appointment with neurology as recommended.  Pt is abstaining from smoking cigarettes.  Relevant past medical, surgical, family and social history reviewed and updated as indicated. Interim medical history since our last visit reviewed. Allergies and medications reviewed and updated.     Current Outpatient Medications:  .  aspirin EC 81 MG EC tablet, Take 1 tablet (81 mg total) by mouth daily., Disp: 120 tablet, Rfl: 0 .  atorvastatin (LIPITOR) 40 MG tablet, Take 1 tablet (40 mg total) by mouth at bedtime., Disp: 90 tablet, Rfl: 1 .  hydrOXYzine (ATARAX/VISTARIL) 25 MG tablet, Take 25 mg by mouth 3 (three) times daily as needed (rest)., Disp: , Rfl:  .  amLODipine (NORVASC) 10 MG tablet, Take 1 tablet (10 mg total) by mouth daily. (Patient not taking: Reported on 03/21/2018), Disp: 90 tablet, Rfl: 1 .  hydrochlorothiazide (HYDRODIURIL) 25 MG tablet, Take 1 tablet (25 mg total) by mouth daily. (Patient not taking: Reported on 03/21/2018), Disp: 90 tablet, Rfl: 1 .  lisinopril (PRINIVIL,ZESTRIL) 10 MG tablet, Take 1 tablet (10 mg total) by mouth daily. (Patient not taking: Reported on 03/21/2018), Disp: 30 tablet, Rfl: 1   Review of Systems  Constitutional: Negative for appetite change, chills, diaphoresis, fatigue, fever and unexpected weight change.  HENT:  Negative for congestion, dental problem, drooling, ear pain, facial swelling, hearing loss, mouth sores, sneezing, sore throat, trouble swallowing and voice change.   Eyes: Negative for pain, discharge, redness, itching and visual disturbance.  Respiratory: Negative for cough, choking, shortness of breath and wheezing.   Cardiovascular: Negative for chest pain, palpitations and leg swelling.  Gastrointestinal: Negative for abdominal pain, blood in stool, constipation, diarrhea and vomiting.  Endocrine: Negative for cold intolerance, heat intolerance and polydipsia.  Genitourinary: Negative for decreased urine volume, dysuria and hematuria.  Musculoskeletal: Negative for arthralgias, back pain and gait problem.  Skin: Negative for rash.  Allergic/Immunologic: Negative for environmental allergies.  Neurological: Negative for seizures, syncope, light-headedness and headaches.  Hematological: Negative for adenopathy.  Psychiatric/Behavioral: Negative for agitation, dysphoric mood and suicidal ideas. The patient is not nervous/anxious.     Per HPI unless specifically indicated above     Objective:    BP (!) 144/86 (BP Location: Right Arm, Patient Position: Sitting, Cuff Size: Large)   Pulse 60   Temp 97.7 F (36.5 C)   Ht 5' 3.5" (1.613 m)   Wt 274 lb 8 oz (124.5 kg)   SpO2 95%   BMI 47.86 kg/m   Wt Readings from Last 3 Encounters:  03/21/18 274 lb 8 oz (124.5 kg)  12/29/17 273 lb (123.8 kg)  11/29/17 269 lb 4 oz (  122.1 kg)    Physical Exam  Constitutional: She is oriented to person, place, and time. She appears well-developed and well-nourished.  HENT:  Head: Normocephalic and atraumatic.  Neck: Neck supple.  Cardiovascular: Normal rate and regular rhythm.  Pulmonary/Chest: Effort normal and breath sounds normal.  Abdominal: Soft. Bowel sounds are normal. She exhibits no mass. There is no hepatosplenomegaly. There is no tenderness.  Musculoskeletal: She exhibits no edema.   Lymphadenopathy:    She has no cervical adenopathy.  Neurological: She is alert and oriented to person, place, and time.  Skin: Skin is warm and dry.  Psychiatric: She has a normal mood and affect. Her behavior is normal.  Vitals reviewed.       Assessment & Plan:    Encounter Diagnoses  Name Primary?  . Essential hypertension Yes  . Hyperlipidemia, unspecified hyperlipidemia type   . Tobacco abuse, in remission   . TIA (transient ischemic attack)   . Intracranial atherosclerosis   . Personal history of noncompliance with medical treatment, presenting hazards to health     -pt to get fasting labs drawn -pt is renewed with [facility] for medication assistance -pt counseled to get back on her medication -pt encouraged to rescheduled with neurology -pt is given contact number for financial counselor so she can check on her [facility] application -congratulated pt on abstaining from smoking and encouraged her to continue -pt to follow up here 1 month to recheck blood pressure and review labs.  RTO sooner prn

## 2018-04-20 ENCOUNTER — Ambulatory Visit: Payer: Self-pay | Admitting: Physician Assistant

## 2018-04-20 ENCOUNTER — Encounter: Payer: Self-pay | Admitting: Physician Assistant

## 2018-04-20 VITALS — BP 128/73 | HR 69 | Temp 97.7°F | Ht 63.5 in | Wt 273.8 lb

## 2018-04-20 DIAGNOSIS — Z5320 Procedure and treatment not carried out because of patient's decision for unspecified reasons: Secondary | ICD-10-CM

## 2018-04-20 DIAGNOSIS — E785 Hyperlipidemia, unspecified: Secondary | ICD-10-CM

## 2018-04-20 DIAGNOSIS — I10 Essential (primary) hypertension: Secondary | ICD-10-CM

## 2018-04-20 DIAGNOSIS — M79651 Pain in right thigh: Secondary | ICD-10-CM

## 2018-04-20 MED ORDER — DICLOFENAC SODIUM 75 MG PO TBEC: 75.0000 mg | DELAYED_RELEASE_TABLET | Freq: Two times a day (BID) | ORAL | 1 refills | Status: DC

## 2018-04-20 NOTE — Progress Notes (Signed)
BP 128/73 (BP Location: Right Arm, Patient Position: Sitting, Cuff Size: Large)   Pulse 69   Temp 97.7 F (36.5 C)   Ht 5' 3.5" (1.613 m)   Wt 273 lb 12 oz (124.2 kg)   SpO2 98%   BMI 47.73 kg/m    Subjective:    Patient ID: Barbara Snow, female    DOB: [birth year], 52 y.o.   MRN: [id number]  HPI: Barbara Snow is a 52 y.o. female presenting on 04/20/2018 for Hypertension and Follow-up   HPI  Pt works at [employer].  She says it's hard to get off so she hasn't been able to meet with the Financial Counselor about her [facility].   Pt c/o pain R thigh for past 2 week.  She is unaware of injury.  She says IBU helps some but then the pain returns after it wears off.  No pain in knee or groin.    Pt is continuing to stay off cigarettes!  Relevant past medical, surgical, family and social history reviewed and updated as indicated. Interim medical history since our last visit reviewed. Allergies and medications reviewed and updated.   Current Outpatient Medications:  .  amLODipine (NORVASC) 10 MG tablet, Take 1 tablet (10 mg total) by mouth daily., Disp: 90 tablet, Rfl: 0 .  aspirin 325 MG tablet, Take 325 mg by mouth daily., Disp: , Rfl:  .  atorvastatin (LIPITOR) 40 MG tablet, Take 1 tablet (40 mg total) by mouth at bedtime., Disp: 90 tablet, Rfl: 0 .  hydrochlorothiazide (HYDRODIURIL) 25 MG tablet, Take 1 tablet (25 mg total) by mouth daily., Disp: 90 tablet, Rfl: 0 .  hydrOXYzine (ATARAX/VISTARIL) 25 MG tablet, Take 25 mg by mouth 3 (three) times daily as needed (rest)., Disp: , Rfl:  .  lisinopril (PRINIVIL,ZESTRIL) 10 MG tablet, Take 1 tablet (10 mg total) by mouth daily., Disp: 90 tablet, Rfl: 0   Review of Systems  Constitutional: Negative for appetite change, chills, diaphoresis, fatigue, fever and unexpected weight change.  HENT: Negative for congestion, dental problem, drooling, ear pain, facial swelling, hearing loss, mouth sores, sneezing, sore throat,  trouble swallowing and voice change.   Eyes: Negative for pain, discharge, redness, itching and visual disturbance.  Respiratory: Negative for cough, choking, shortness of breath and wheezing.   Cardiovascular: Positive for leg swelling. Negative for chest pain and palpitations.  Gastrointestinal: Negative for abdominal pain, blood in stool, constipation, diarrhea and vomiting.  Endocrine: Negative for cold intolerance, heat intolerance and polydipsia.  Genitourinary: Negative for decreased urine volume, dysuria and hematuria.  Musculoskeletal: Negative for arthralgias, back pain and gait problem.  Skin: Negative for rash.  Allergic/Immunologic: Negative for environmental allergies.  Neurological: Positive for headaches. Negative for seizures, syncope and light-headedness.  Hematological: Negative for adenopathy.  Psychiatric/Behavioral: Negative for agitation, dysphoric mood and suicidal ideas. The patient is not nervous/anxious.     Per HPI unless specifically indicated above     Objective:    BP 128/73 (BP Location: Right Arm, Patient Position: Sitting, Cuff Size: Large)   Pulse 69   Temp 97.7 F (36.5 C)   Ht 5' 3.5" (1.613 m)   Wt 273 lb 12 oz (124.2 kg)   SpO2 98%   BMI 47.73 kg/m   Wt Readings from Last 3 Encounters:  04/20/18 273 lb 12 oz (124.2 kg)  03/21/18 274 lb 8 oz (124.5 kg)  12/29/17 273 lb (123.8 kg)    Physical Exam  Constitutional: She  is oriented to person, place, and time. She appears well-developed and well-nourished.  HENT:  Head: Normocephalic and atraumatic.  Neck: Neck supple.  Cardiovascular: Normal rate and regular rhythm.  Pulmonary/Chest: Effort normal and breath sounds normal.  Abdominal: Soft. Bowel sounds are normal. She exhibits no mass. There is no hepatosplenomegaly. There is no tenderness.  Musculoskeletal: She exhibits no edema.       Right upper leg: She exhibits tenderness. She exhibits no bony tenderness, no swelling, no edema, no  deformity and no laceration.       Legs: Anterior R thigh with tenderness of the soft tissues.  No redness or heat.  No abscess or signs infection.  No bruising or abrasion.   Lymphadenopathy:    She has no cervical adenopathy.  Neurological: She is alert and oriented to person, place, and time.  Skin: Skin is warm and dry.  Psychiatric: She has a normal mood and affect. Her behavior is normal.  Vitals reviewed.   Results for orders placed or performed during the hospital encounter of 03/21/18  Comprehensive metabolic panel  Result Value Ref Range   Sodium 138 135 - 145 mmol/L   Potassium 3.4 (L) 3.5 - 5.1 mmol/L   Chloride 103 98 - 111 mmol/L   CO2 27 22 - 32 mmol/L   Glucose, Bld 99 70 - 99 mg/dL   BUN 10 6 - 20 mg/dL   Creatinine, Ser 0.81 0.44 - 1.00 mg/dL   Calcium 9.2 8.9 - 10.3 mg/dL   Total Protein 7.8 6.5 - 8.1 g/dL   Albumin 3.7 3.5 - 5.0 g/dL   AST 18 15 - 41 U/L   ALT 20 0 - 44 U/L   Alkaline Phosphatase 74 38 - 126 U/L   Total Bilirubin 1.4 (H) 0.3 - 1.2 mg/dL   GFR calc non Af Amer >60 >60 mL/min   GFR calc Af Amer >60 >60 mL/min   Anion gap 8 5 - 15  Lipid panel  Result Value Ref Range   Cholesterol 116 0 - 200 mg/dL   Triglycerides 69 <150 mg/dL   HDL 40 (L) >40 mg/dL   Total CHOL/HDL Ratio 2.9 RATIO   VLDL 14 0 - 40 mg/dL   LDL Cholesterol 62 0 - 99 mg/dL      Assessment & Plan:    Encounter Diagnoses  Name Primary?  . Essential hypertension Yes  . Hyperlipidemia, unspecified hyperlipidemia type   . Right thigh pain   . Morbid obesity ([place])   . Mammogram declined   . Pap smear of cervix declined     -reviewed labs with pt -Pt declines mammogram and PAP smear -Pt still has iFOBT at home that she needs to return for colon cancer screening -encouraged pt to get in with Financial Counselor so she can get her [facility] and follow up with neurology -pt to reschedule her follow up with neurology -will monitor K+ -pt to continue current  medications -rx diclofenac for thigh and recommend warm compresses.  Pt counseled to take diclofenac with food and avoid IBU while taking the diclofenac -pt to follow up in 3 months.  RTO sooner prn

## 2018-05-10 ENCOUNTER — Ambulatory Visit: Payer: Self-pay | Admitting: Physician Assistant

## 2018-05-19 ENCOUNTER — Encounter (HOSPITAL_COMMUNITY): Payer: Self-pay | Admitting: Emergency Medicine

## 2018-05-19 ENCOUNTER — Emergency Department (HOSPITAL_COMMUNITY): Payer: Self-pay

## 2018-05-19 ENCOUNTER — Emergency Department (HOSPITAL_COMMUNITY)
Admission: EM | Admit: 2018-05-19 | Discharge: 2018-05-19 | Disposition: A | Discharge status: Home / Self Care | Payer: Self-pay | Attending: Emergency Medicine | Admitting: Emergency Medicine

## 2018-05-19 ENCOUNTER — Other Ambulatory Visit: Payer: Self-pay

## 2018-05-19 DIAGNOSIS — Z87891 Personal history of nicotine dependence: Secondary | ICD-10-CM | POA: Insufficient documentation

## 2018-05-19 DIAGNOSIS — R109 Unspecified abdominal pain: Secondary | ICD-10-CM | POA: Insufficient documentation

## 2018-05-19 DIAGNOSIS — J449 Chronic obstructive pulmonary disease, unspecified: Secondary | ICD-10-CM | POA: Insufficient documentation

## 2018-05-19 DIAGNOSIS — Z8673 Personal history of transient ischemic attack (TIA), and cerebral infarction without residual deficits: Secondary | ICD-10-CM | POA: Insufficient documentation

## 2018-05-19 DIAGNOSIS — I700 Atherosclerosis of aorta: Secondary | ICD-10-CM | POA: Insufficient documentation

## 2018-05-19 DIAGNOSIS — I10 Essential (primary) hypertension: Secondary | ICD-10-CM | POA: Insufficient documentation

## 2018-05-19 DIAGNOSIS — K5730 Diverticulosis of large intestine without perforation or abscess without bleeding: Secondary | ICD-10-CM | POA: Insufficient documentation

## 2018-05-19 LAB — URINALYSIS, ROUTINE W REFLEX MICROSCOPIC
Bilirubin Urine: NEGATIVE
Glucose, UA: NEGATIVE mg/dL
Hgb urine dipstick: NEGATIVE
Ketones, ur: NEGATIVE mg/dL
Leukocytes, UA: NEGATIVE
Nitrite: NEGATIVE
Protein, ur: NEGATIVE mg/dL
Specific Gravity, Urine: 1.01 (ref 1.005–1.030)
pH: 7 (ref 5.0–8.0)

## 2018-05-19 MED ORDER — NAPROXEN 500 MG PO TABS: 500.0000 mg | ORAL_TABLET | Freq: Two times a day (BID) | ORAL | 0 refills | Status: DC | PRN

## 2018-05-19 MED ORDER — TRAMADOL HCL 50 MG PO TABS: 50.0000 mg | ORAL_TABLET | Freq: Four times a day (QID) | ORAL | 0 refills | Status: DC | PRN

## 2018-05-19 MED ORDER — KETOROLAC TROMETHAMINE 30 MG/ML IJ SOLN
30.0000 mg | Freq: Once | INTRAMUSCULAR | Status: AC
  Administered 2018-05-19: 30 mg via INTRAMUSCULAR
  Filled 2018-05-19: qty 1

## 2018-05-19 NOTE — ED Provider Notes (Signed)
Emergency Department Provider Note   I have reviewed the triage vital signs and the nursing notes.   HISTORY  Chief Complaint Flank Pain   HPI Barbara Snow is a 52 y.o. female with PMH of COPD, HLD, HTN, and prior CVA resents to the emergency department for evaluation of right flank pain.  Pain began yesterday.  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  She states that there were several falls yesterday which she had to help and lift adult people off the ground.  She initially began feeling pain in her right flank and attributed it to muscle strain.  Her pain is intermittent and has become more severe.  She states she is been up most of the night with pain.  She states this does now feels similar to kidney stone pain which she is had in the past.  She has not required stent placement or lithotripsy.  No fevers or chills.  No change in urine color.  No chest discomfort or shortness of breath.    Past Medical History:  Diagnosis Date  . COPD (chronic obstructive pulmonary disease) ([place])   . Depression   . Hyperlipidemia   . Hypertension   . Kidney stone   . Stroke [facility]) 10/2017    Patient Active Problem List   Diagnosis Date Noted  . Intracranial atherosclerosis 12/05/2017  . TIA (transient ischemic attack) 11/06/2017  . Hypertension 11/06/2017  . COPD (chronic obstructive pulmonary disease) ([place]) 11/06/2017  . Tobacco use 11/06/2017    Past Surgical History:  Procedure Laterality Date  . ABDOMINAL HYSTERECTOMY    . CHOLECYSTECTOMY    . TUBAL LIGATION      Allergies Patient has no known allergies.  Family History  Problem Relation Age of Onset  . Diabetes Mother   . Cancer Mother   . Heart attack Father   . Liver disease Father   . Hypertension Daughter   . Hypertension Daughter   . Diabetes Sister   . Hypertension Son     Social History Social History   Tobacco Use  . Smoking status: Former Smoker    Packs/day: 0.00    Years:  21.00    Pack years: 0.00    Types: Cigarettes    Last attempt to quit: 11/06/2017    Years since quitting: 0.5  . Smokeless tobacco: Never Used  Substance Use Topics  . Alcohol use: Yes    Comment: occ  . Drug use: No    Review of Systems  Constitutional: No fever/chills Eyes: No visual changes. ENT: No sore throat. Cardiovascular: Denies chest pain. Respiratory: Denies shortness of breath. Gastrointestinal: Positive right flank pain. No nausea, no vomiting.  No diarrhea.  No constipation. Genitourinary: Negative for dysuria. Musculoskeletal: Negative for back pain. Skin: Negative for rash. Neurological: Negative for headaches, focal weakness or numbness.  10-point ROS otherwise negative.  ____________________________________________   PHYSICAL EXAM:  VITAL SIGNS: ED Triage Vitals  Enc Vitals Group     BP 05/19/18 0635 140/89     Pulse Rate 05/19/18 0635 82     Resp 05/19/18 0635 16     Temp 05/19/18 0635 98.2 F (36.8 C)     Temp Source 05/19/18 0635 Oral     SpO2 05/19/18 0635 100 %     Pain Score 05/19/18 0632 10   Constitutional: Alert and oriented. Sitting in bedside chair holding right flank. Appears mildly uncomfortable.  Eyes: Conjunctivae are normal.  Head: Atraumatic. Nose: No congestion/rhinnorhea.  Mouth/Throat: Mucous membranes are moist.  Neck: No stridor. Cardiovascular: Normal rate, regular rhythm. Good peripheral circulation. Grossly normal heart sounds.   Respiratory: Normal respiratory effort.  No retractions. Lungs CTAB. Gastrointestinal: Soft and nontender. No distention. No flank tenderness to palpation.  Musculoskeletal: No lower extremity tenderness nor edema. No gross deformities of extremities. Neurologic:  Normal speech and language. No gross focal neurologic deficits are appreciated.  Skin:  Skin is warm, dry and intact. No rash noted.   ____________________________________________   LABS (all labs ordered are listed, but only  abnormal results are displayed)  Labs Reviewed  URINALYSIS, ROUTINE W REFLEX MICROSCOPIC - Abnormal; Notable for the following components:      Result Value   APPearance HAZY (*)    All other components within normal limits   ____________________________________________  RADIOLOGY  Ct Renal Stone Study  Result Date: 05/19/2018 CLINICAL DATA:  53 year old female with history of right-sided flank pain since yesterday. History of kidney stones. EXAM: CT ABDOMEN AND PELVIS WITHOUT CONTRAST TECHNIQUE: Multidetector CT imaging of the abdomen and pelvis was performed following the standard protocol without IV contrast. COMPARISON:  CT the abdomen and pelvis 12/29/2013. FINDINGS: Lower chest: Unremarkable. Hepatobiliary: No definite cystic or solid hepatic lesions are identified on today's noncontrast CT examination. Status post cholecystectomy. Pancreas: No definite pancreatic mass or peripancreatic fluid or inflammatory changes are noted on today's noncontrast CT examination. Spleen: Unremarkable. Adrenals/Urinary Tract: There are no abnormal calcifications within the collecting system of either kidney, along the course of either ureter, or within the lumen of the urinary bladder. No hydroureteronephrosis or perinephric stranding to suggest urinary tract obstruction at this time. The unenhanced appearance of the kidneys is unremarkable bilaterally. Unenhanced appearance of the urinary bladder is normal. Bilateral adrenal glands are normal in appearance. Stomach/Bowel: Unenhanced appearance of the stomach is normal. No pathologic dilatation of small bowel or colon. A few scattered colonic diverticulae are noted, without surrounding inflammatory changes to suggest an acute diverticulitis at this time. Normal appendix. Vascular/Lymphatic: Aortic atherosclerosis. No lymphadenopathy noted in the abdomen or pelvis. Reproductive: Status post hysterectomy. Ovaries are not confidently identified may be surgically  absent or atrophic Other: No significant volume of ascites.  No pneumoperitoneum. Musculoskeletal: There are no aggressive appearing lytic or blastic lesions noted in the visualized portions of the skeleton. IMPRESSION: 1. No acute findings are noted in the abdomen or pelvis to account for the patient's symptoms. Specifically, no urinary tract calculi no findings of urinary tract obstruction are noted at this time. 2. Mild colonic diverticulosis without evidence to suggest an acute diverticulitis. 3. Normal appendix. 4. Aortic atherosclerosis. Electronically Signed   By: Vinnie Langton M.D.   On: 05/19/2018 07:17    ____________________________________________   PROCEDURES  Procedure(s) performed:   Procedures  None ____________________________________________   INITIAL IMPRESSION / ASSESSMENT AND PLAN / ED COURSE  Pertinent labs & imaging results that were available during my care of the patient were reviewed by me and considered in my medical decision making (see chart for details).  Patient presents to the emergency department for evaluation of right flank pain feels similar to prior kidney stones.  Patient does not have a Averianna Brugger history of kidney stones.  No stones requiring intervention in the past.  No fevers or concern for sepsis.  Very low suspicion for vascular or chest etiology.  Plan for CT renal along with UA. Toradol for pain and reassess.   8:02 AM Imaging reviewed with no acute findings to explain the  patient's pain symptoms.  Urinalysis is unremarkable and negative for blood or infection.  Suspect musculoskeletal strain versus sciatica.  No red-flag symptoms to suspect acute spine emergency. Plan for Naproxen, Tylenol, OTC lidocaine patch (dicussed with patient), and Tramadol for breakthrough pain. Discussed ED return precautions in detail.   ____________________________________________  FINAL CLINICAL IMPRESSION(S) / ED DIAGNOSES  Final diagnoses:  Right flank pain      MEDICATIONS GIVEN DURING THIS VISIT:  Medications  ketorolac (TORADOL) 30 MG/ML injection 30 mg (30 mg Intramuscular Given 05/19/18 0652)     NEW OUTPATIENT MEDICATIONS STARTED DURING THIS VISIT:  New Prescriptions   NAPROXEN (NAPROSYN) 500 MG TABLET    Take 1 tablet (500 mg total) by mouth 2 (two) times daily as needed for moderate pain.   TRAMADOL (ULTRAM) 50 MG TABLET    Take 1 tablet (50 mg total) by mouth every 6 (six) hours as needed for severe pain.    Note:  This document was prepared using Dragon voice recognition software and may include unintentional dictation errors.  Nanda Quinton, MD Emergency Medicine    Lacretia Tindall, [facility], MD 05/19/18 [phone]

## 2018-05-19 NOTE — ED Triage Notes (Signed)
Pt c/o right flank pain since yesterday. Pt states it feels like when she had kidney stones.

## 2018-05-19 NOTE — Discharge Instructions (Signed)

## 2018-06-10 ENCOUNTER — Other Ambulatory Visit: Payer: Self-pay | Admitting: Physician Assistant

## 2018-06-10 MED ORDER — ATORVASTATIN CALCIUM 40 MG PO TABS: 40.0000 mg | ORAL_TABLET | Freq: Every day | ORAL | 0 refills | Status: DC

## 2018-06-10 MED ORDER — LISINOPRIL 10 MG PO TABS: 10.0000 mg | ORAL_TABLET | Freq: Every day | ORAL | 0 refills | Status: DC

## 2018-06-10 MED ORDER — AMLODIPINE BESYLATE 10 MG PO TABS: 10.0000 mg | ORAL_TABLET | Freq: Every day | ORAL | 0 refills | Status: DC

## 2018-06-10 MED ORDER — HYDROCHLOROTHIAZIDE 25 MG PO TABS: 25.0000 mg | ORAL_TABLET | Freq: Every day | ORAL | 0 refills | Status: DC

## 2018-07-12 ENCOUNTER — Other Ambulatory Visit: Payer: Self-pay | Admitting: Physician Assistant

## 2018-07-12 ENCOUNTER — Other Ambulatory Visit (HOSPITAL_COMMUNITY)
Admission: RE | Admit: 2018-07-12 | Discharge: 2018-07-12 | Disposition: A | Discharge status: Home / Self Care | Payer: Self-pay | Source: Ambulatory Visit | Attending: Physician Assistant | Admitting: Physician Assistant

## 2018-07-12 DIAGNOSIS — E785 Hyperlipidemia, unspecified: Secondary | ICD-10-CM | POA: Insufficient documentation

## 2018-07-12 DIAGNOSIS — I10 Essential (primary) hypertension: Secondary | ICD-10-CM | POA: Insufficient documentation

## 2018-07-12 DIAGNOSIS — E876 Hypokalemia: Secondary | ICD-10-CM

## 2018-07-12 LAB — LIPID PANEL
CHOLESTEROL: 134 mg/dL (ref 0–200)
HDL: 56 mg/dL (ref >40)
LDL Cholesterol: 52 mg/dL (ref 0–99)
Total CHOL/HDL Ratio: 2.4 RATIO
Triglycerides: 130 mg/dL (ref <150)
VLDL: 26 mg/dL (ref 0–40)

## 2018-07-12 LAB — COMPREHENSIVE METABOLIC PANEL
ALBUMIN: 4.4 g/dL (ref 3.5–5.0)
ALK PHOS: 79 U/L (ref 38–126)
ALT: 22 U/L (ref 0–44)
ANION GAP: 9 (ref 5–15)
AST: 19 U/L (ref 15–41)
BILIRUBIN TOTAL: 1.4 mg/dL — AB (ref 0.3–1.2)
BUN: 25 mg/dL — AB (ref 6–20)
CALCIUM: 9.4 mg/dL (ref 8.9–10.3)
CO2: 27 mmol/L (ref 22–32)
Chloride: 99 mmol/L (ref 98–111)
Creatinine, Ser: 0.84 mg/dL (ref 0.44–1.00)
GFR calc Af Amer: >60 mL/min (ref >60)
GLUCOSE: 108 mg/dL — AB (ref 70–99)
POTASSIUM: 3 mmol/L — AB (ref 3.5–5.1)
Sodium: 135 mmol/L (ref 135–145)
TOTAL PROTEIN: 8.9 g/dL — AB (ref 6.5–8.1)

## 2018-07-12 MED ORDER — POTASSIUM CHLORIDE ER 20 MEQ PO TBCR: 20.0000 meq | EXTENDED_RELEASE_TABLET | Freq: Every day | ORAL | 0 refills | Status: DC

## 2018-07-18 ENCOUNTER — Other Ambulatory Visit (HOSPITAL_COMMUNITY)
Admission: RE | Admit: 2018-07-18 | Discharge: 2018-07-18 | Disposition: A | Discharge status: Home / Self Care | Payer: Self-pay | Source: Ambulatory Visit | Attending: Physician Assistant | Admitting: Physician Assistant

## 2018-07-18 DIAGNOSIS — E876 Hypokalemia: Secondary | ICD-10-CM | POA: Insufficient documentation

## 2018-07-18 LAB — POTASSIUM: POTASSIUM: 3.5 mmol/L (ref 3.5–5.1)

## 2018-07-21 ENCOUNTER — Ambulatory Visit: Payer: Self-pay | Admitting: Physician Assistant

## 2018-07-28 ENCOUNTER — Encounter: Payer: Self-pay | Admitting: Physician Assistant

## 2018-07-28 ENCOUNTER — Ambulatory Visit: Payer: Self-pay | Admitting: Physician Assistant

## 2018-07-28 VITALS — BP 129/79 | HR 64 | Temp 97.9°F | Ht 60.0 in | Wt 276.0 lb

## 2018-07-28 DIAGNOSIS — E785 Hyperlipidemia, unspecified: Secondary | ICD-10-CM

## 2018-07-28 DIAGNOSIS — Z1239 Encounter for other screening for malignant neoplasm of breast: Secondary | ICD-10-CM

## 2018-07-28 DIAGNOSIS — J00 Acute nasopharyngitis [common cold]: Secondary | ICD-10-CM

## 2018-07-28 DIAGNOSIS — Z5320 Procedure and treatment not carried out because of patient's decision for unspecified reasons: Secondary | ICD-10-CM

## 2018-07-28 DIAGNOSIS — E876 Hypokalemia: Secondary | ICD-10-CM

## 2018-07-28 DIAGNOSIS — I10 Essential (primary) hypertension: Secondary | ICD-10-CM

## 2018-07-28 NOTE — Progress Notes (Signed)
BP 129/79 (BP Location: Right Arm, Patient Position: Sitting, Cuff Size: Large)   Pulse 64   Temp 97.9 F (36.6 C) (Other (Comment))   Ht 5' (1.524 m)   Wt 276 lb (125.2 kg)   SpO2 100%   BMI 53.90 kg/m    Subjective:    Patient ID: Barbara Snow, female    DOB: 05-27-1966, 53 y.o.   MRN: [id number]  HPI: Barbara Snow is a 53 y.o. 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 Interim medical history since our last visit reviewed. Allergies and medications reviewed and updated.   Current Outpatient Medications:  .  amLODipine (NORVASC) 10 MG tablet, Take 1 tablet (10 mg total) by mouth daily., Disp: 90 tablet, Rfl: 0 .  aspirin 325 MG tablet, Take 325 mg by mouth daily., Disp: , Rfl:  .  atorvastatin (LIPITOR) 40 MG tablet, Take 1 tablet (40 mg total) by mouth at bedtime., Disp: 90 tablet, Rfl: 0 .  hydrochlorothiazide (HYDRODIURIL) 25 MG tablet, Take 1 tablet (25 mg total) by mouth daily., Disp: 90 tablet, Rfl: 0 .  hydrOXYzine (ATARAX/VISTARIL) 25 MG tablet, Take 25 mg by mouth 3 (three) times daily as needed (rest)., Disp: , Rfl:  .  lisinopril (PRINIVIL,ZESTRIL) 10 MG tablet, Take 1 tablet (10 mg total) by mouth daily., Disp: 90 tablet, Rfl: 0   Review of Systems  Constitutional: Positive for chills and diaphoresis. Negative for appetite change, fatigue, fever and unexpected weight change.  HENT: Positive for sore throat. Negative for congestion, dental problem, drooling, ear pain, facial swelling, hearing loss, mouth sores, sneezing, trouble swallowing and voice change.   Eyes: Negative for pain, discharge, redness, itching and visual disturbance.   Respiratory: Negative for cough, choking, shortness of breath and wheezing.   Cardiovascular: Positive for leg swelling. Negative for chest pain and palpitations.  Gastrointestinal: Negative for abdominal pain, blood in stool, constipation, diarrhea and vomiting.  Endocrine: Positive for cold intolerance and heat intolerance. Negative for polydipsia.  Genitourinary: Negative for decreased urine volume, dysuria and hematuria.  Musculoskeletal: Negative for arthralgias, back pain and gait problem.  Skin: Negative for rash.  Allergic/Immunologic: Negative for environmental allergies.  Neurological: Positive for light-headedness and headaches. Negative for seizures and syncope.  Hematological: Negative for adenopathy.  Psychiatric/Behavioral: Negative for agitation, dysphoric mood and suicidal ideas. The patient is not nervous/anxious.     Per HPI unless specifically indicated above     Objective:    BP 129/79 (BP Location: Right Arm, Patient Position: Sitting, Cuff Size: Large)   Pulse 64   Temp 97.9 F (36.6 C) (Other (Comment))   Ht 5' (1.524 m)   Wt 276 lb (125.2 kg)   SpO2 100%   BMI 53.90 kg/m   Wt Readings from Last 3 Encounters:  07/28/18 276 lb (125.2 kg)  04/20/18 273 lb 12 oz (124.2 kg)  03/21/18 274 lb 8 oz (124.5 kg)    Physical Exam Vitals signs reviewed.  Constitutional:      Appearance: She is well-developed.  HENT:     Head: Normocephalic and atraumatic.     Right Ear: Hearing, tympanic membrane,  ear canal and external ear normal.     Left Ear: Hearing, tympanic membrane, ear canal and external ear normal.     Nose: Nose normal.     Mouth/Throat:     Pharynx: Uvula midline. No oropharyngeal exudate.  Neck:     Musculoskeletal: Neck supple.  Cardiovascular:     Rate and Rhythm: Normal rate and regular rhythm.  Pulmonary:     Effort: Pulmonary effort is normal.     Breath sounds: Normal breath sounds. No wheezing.  Abdominal:     General: Bowel sounds  are normal.     Palpations: Abdomen is soft. There is no mass.     Tenderness: There is no abdominal tenderness.  Lymphadenopathy:     Cervical: No cervical adenopathy.  Skin:    General: Skin is warm and dry.  Neurological:     Mental Status: She is alert and oriented to person, place, and time.  Psychiatric:        Behavior: Behavior normal.     Results for orders placed or performed during the hospital encounter of 07/18/18  Potassium  Result Value Ref Range   Potassium 3.5 3.5 - 5.1 mmol/L      Assessment & Plan:    Encounter Diagnoses  Name Primary?  . Essential hypertension Yes  . Hyperlipidemia, unspecified hyperlipidemia type   . Morbid obesity ([place])   . Pap smear of cervix declined   . Screening for breast cancer   . Hypokalemia   . Acute nasopharyngitis      -Pt agrees to screening mammogram  -Pt declines PAP -Pt reminded to return iFOBT for colon cancer screening -Recheck K+ in 6 wk -pt given note to be out of work today and tomorrow as she is feeling sick.  -pt to return fo office in  3 month.  RTO sooner prn

## 2018-08-15 ENCOUNTER — Other Ambulatory Visit: Payer: Self-pay | Admitting: Physician Assistant

## 2018-08-15 DIAGNOSIS — Z1239 Encounter for other screening for malignant neoplasm of breast: Secondary | ICD-10-CM

## 2018-08-18 ENCOUNTER — Other Ambulatory Visit: Payer: Self-pay | Admitting: Physician Assistant

## 2018-08-25 ENCOUNTER — Ambulatory Visit (HOSPITAL_COMMUNITY): Payer: Self-pay

## 2018-09-05 ENCOUNTER — Encounter (HOSPITAL_COMMUNITY): Payer: Self-pay

## 2018-09-05 ENCOUNTER — Ambulatory Visit (HOSPITAL_COMMUNITY): Payer: Self-pay

## 2018-09-22 ENCOUNTER — Telehealth: Payer: Self-pay | Admitting: Physician Assistant

## 2018-10-27 ENCOUNTER — Encounter: Payer: Self-pay | Admitting: Physician Assistant

## 2018-10-27 ENCOUNTER — Ambulatory Visit: Payer: Self-pay | Admitting: Physician Assistant

## 2018-10-27 DIAGNOSIS — I10 Essential (primary) hypertension: Secondary | ICD-10-CM

## 2018-10-27 DIAGNOSIS — F39 Unspecified mood [affective] disorder: Secondary | ICD-10-CM

## 2018-10-27 DIAGNOSIS — E785 Hyperlipidemia, unspecified: Secondary | ICD-10-CM

## 2018-10-27 NOTE — Progress Notes (Signed)
   There were no vitals taken for this visit.   Subjective:    Patient ID: Barbara Snow, female    DOB: [birth year], 53 y.o.   MRN: [id number]  HPI: Barbara Snow is a 53 y.o. female presenting on 10/27/2018 for No chief complaint on file.   HPI   This is a telemedicine visit due to coronavirus pandemic.  It is a Telephone visit as pt doesn't have a smartphone.   I connected with  Barbara Snow on 10/27/18 by a video enabled telemedicine application and verified that I am speaking with the correct person using two identifiers.   I discussed the limitations of evaluation and management by telemedicine. The patient expressed understanding and agreed to proceed.   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  She is not having No coughs or fevers  She is having some anxiety.  She says it's due to deaths in family.  This was a year ago or more.  She denies SI, HI.       Relevant past medical, surgical, family and social history reviewed and updated as indicated. Interim medical history since our last visit reviewed. Allergies and medications reviewed and updated.     Current Outpatient Medications:  .  amLODipine (NORVASC) 10 MG tablet, TAKE 1 Tablet BY MOUTH ONCE DAILY, Disp: 90 tablet, Rfl: 0 .  aspirin 325 MG tablet, Take 325 mg by mouth daily., Disp: , Rfl:  .  atorvastatin (LIPITOR) 40 MG tablet, TAKE 1 Tablet BY MOUTH EVERY NIGHT AT BEDTIME, Disp: 90 tablet, Rfl: 0 .  hydrochlorothiazide (HYDRODIURIL) 25 MG tablet, TAKE 1 Tablet BY MOUTH ONCE DAILY, Disp: 90 tablet, Rfl: 0 .  lisinopril (PRINIVIL,ZESTRIL) 10 MG tablet, TAKE 1 Tablet BY MOUTH ONCE DAILY, Disp: 90 tablet, Rfl: 0 .  hydrOXYzine (ATARAX/VISTARIL) 25 MG tablet, Take 25 mg by mouth 3 (three) times daily as needed (rest)., Disp: , Rfl:     Review of Systems  Per HPI unless specifically indicated above     Objective:    There were no vitals taken for this visit.  Wt Readings from Last 3  Encounters:  07/28/18 276 lb (125.2 kg)  04/20/18 273 lb 12 oz (124.2 kg)  03/21/18 274 lb 8 oz (124.5 kg)    Physical Exam Pulmonary:     Effort: Pulmonary effort is normal. No respiratory distress.  Neurological:     Mental Status: She is alert and oriented to person, place, and time.  Psychiatric:        Attention and Perception: Attention normal.        Speech: Speech normal.        Behavior: Behavior is cooperative.          Assessment & Plan:    Encounter Diagnoses  Name Primary?  . Essential hypertension Yes  . Hyperlipidemia, unspecified hyperlipidemia type   . Mood disorder ([place])     -pt encouraged to call [facility] in light of persistent grief and depression.  She is given contact information  -pt to Continue current rx  -will defer labs due to CV19  -pt witll follow up in 3 months.  She is to RTO sooner prn.

## 2018-11-03 ENCOUNTER — Other Ambulatory Visit: Payer: Self-pay | Admitting: Physician Assistant

## 2018-11-30 ENCOUNTER — Ambulatory Visit: Payer: Self-pay | Admitting: Physician Assistant

## 2018-11-30 ENCOUNTER — Other Ambulatory Visit: Payer: Self-pay

## 2018-11-30 ENCOUNTER — Encounter: Payer: Self-pay | Admitting: Physician Assistant

## 2018-11-30 ENCOUNTER — Telehealth: Payer: Self-pay

## 2018-11-30 DIAGNOSIS — M791 Myalgia, unspecified site: Secondary | ICD-10-CM

## 2018-11-30 DIAGNOSIS — Z20822 Contact with and (suspected) exposure to covid-19: Secondary | ICD-10-CM

## 2018-11-30 DIAGNOSIS — R5383 Other fatigue: Secondary | ICD-10-CM

## 2018-11-30 DIAGNOSIS — R110 Nausea: Secondary | ICD-10-CM

## 2018-11-30 DIAGNOSIS — R42 Dizziness and giddiness: Secondary | ICD-10-CM

## 2018-11-30 DIAGNOSIS — R519 Headache, unspecified: Secondary | ICD-10-CM

## 2018-11-30 MED ORDER — PROMETHAZINE HCL 25 MG PO TABS: 25.0000 mg | ORAL_TABLET | Freq: Three times a day (TID) | ORAL | 0 refills | Status: DC | PRN

## 2018-11-30 NOTE — Progress Notes (Signed)
There were no vitals taken for this visit.   Subjective:    Patient ID: Barbara Snow, female    DOB: Aug 10, 1965, 53 y.o.   MRN: [id number]  HPI: CRYSTEN KAMAN is a 53 y.o. female presenting on 11/30/2018 for No chief complaint on file.   HPI    This is a telemedicine visit due to coronavirus pandemic.  It is a Telephone visit as pt does not have a smartphone with video capabilities.  I connected with  Barbara Snow on 11/30/18 by a video enabled telemedicine application and verified that I am speaking with the correct person using two identifiers.   I discussed the limitations of evaluation and management by telemedicine. The patient expressed understanding and agreed to proceed.  Pt is at home. Provider is at office/clinic  Pt says that all Last week she was not feeling good, she was tired and just not her usual self.  She says on Saturday her stomach started "bubbling" like she was going to throw up.  She says her symptoms have been progressing and she is now feeling dizzy and having HA as well as the nausea which persists.    She has not had any emesis or fever.  She denies cough, sob.    SHE WORKS AT [employer]         Relevant past medical, surgical, family and social history reviewed and updated as indicated. Interim medical history since our last visit reviewed. Allergies and medications reviewed and updated.   Current Outpatient Medications:  .  amLODipine (NORVASC) 10 MG tablet, TAKE 1 Tablet BY MOUTH ONCE DAILY, Disp: 90 tablet, Rfl: 0 .  aspirin 325 MG tablet, Take 325 mg by mouth daily., Disp: , Rfl:  .  atorvastatin (LIPITOR) 40 MG tablet, TAKE 1 Tablet BY MOUTH EVERY NIGHT AT BEDTIME, Disp: 90 tablet, Rfl: 0 .  hydrochlorothiazide (HYDRODIURIL) 25 MG tablet, TAKE 1 Tablet BY MOUTH ONCE DAILY, Disp: 90 tablet, Rfl: 0 .  hydrOXYzine (ATARAX/VISTARIL) 25 MG tablet, Take 25 mg by mouth 3 (three) times daily as needed (rest)., Disp: , Rfl:  .  lisinopril  (ZESTRIL) 10 MG tablet, TAKE 1 Tablet BY MOUTH ONCE DAILY, Disp: 90 tablet, Rfl: 0   Review of Systems  Per HPI unless specifically indicated above     Objective:    There were no vitals taken for this visit.  Wt Readings from Last 3 Encounters:  07/28/18 276 lb (125.2 kg)  04/20/18 273 lb 12 oz (124.2 kg)  03/21/18 274 lb 8 oz (124.5 kg)    Physical Exam Pulmonary:     Effort: Pulmonary effort is normal. No respiratory distress.  Neurological:     Mental Status: She is alert and oriented to person, place, and time.  Psychiatric:        Attention and Perception: Attention normal.        Speech: Speech normal.        Behavior: Behavior is cooperative.        Cognition and Memory: Cognition normal.     Comments: PE limited due to pt not in office/telephone visit           Assessment & Plan:    Encounter Diagnoses  Name Primary?  . Nausea Yes  . Myalgia   . Fatigue, unspecified type   . Dizzy   . Acute nonintractable headache, unspecified headache type     -rx phenergan to  Help with her nausea.  She  is counseled on gently sips and need to prevent dehydration.    -Pt scheduled for COVID-19 testing today.  She is counseled to not return back to work until she gets her test resutls.  We will fax a note to her employer for her to be out of work (she will call us with the fax number)  -all of pt questions about testing and staying home until her results are in are answered

## 2018-11-30 NOTE — Telephone Encounter (Signed)
Barbara Dryer, PA at [facility] called to have this patient scheduled for Covid Testing, she says she will call the patient as her numbers have not been updated. Appointment scheduled for today at 3 at [facility], advised location and to wear a mask for everyone in the vehicle, she verbalized understanding and will relay this information to the patient.

## 2018-12-02 LAB — NOVEL CORONAVIRUS, NAA: SARS-CoV-2, NAA: NOT DETECTED

## 2018-12-13 ENCOUNTER — Other Ambulatory Visit: Payer: Self-pay | Admitting: Physician Assistant

## 2018-12-13 DIAGNOSIS — E785 Hyperlipidemia, unspecified: Secondary | ICD-10-CM

## 2018-12-13 DIAGNOSIS — I10 Essential (primary) hypertension: Secondary | ICD-10-CM

## 2018-12-26 ENCOUNTER — Telehealth: Payer: Self-pay | Admitting: Physician Assistant

## 2018-12-28 ENCOUNTER — Encounter: Payer: Self-pay | Admitting: Physician Assistant

## 2018-12-28 ENCOUNTER — Ambulatory Visit: Payer: Self-pay | Admitting: Physician Assistant

## 2018-12-28 DIAGNOSIS — F39 Unspecified mood [affective] disorder: Secondary | ICD-10-CM

## 2018-12-28 DIAGNOSIS — E785 Hyperlipidemia, unspecified: Secondary | ICD-10-CM

## 2018-12-28 DIAGNOSIS — I10 Essential (primary) hypertension: Secondary | ICD-10-CM

## 2018-12-28 NOTE — Progress Notes (Signed)
There were no vitals taken for this visit.   Subjective:    Patient ID: Barbara Snow, female    DOB: 1966-02-24, 53 y.o.   MRN: [id number]  HPI: HESPER VENTURELLA is a 53 y.o. female presenting on 12/28/2018 for Hypertension and Hyperlipidemia   HPI  This is a telemedicine visit due to coronavirus pandemic.  It is a Telephone visit as pt does not have a smartphone with video capabilities.   I connected with  Barbara Snow on 12/28/18 by a video enabled telemedicine application and verified that I am speaking with the correct person using two identifiers.   I discussed the limitations of evaluation and management by telemedicine. The patient expressed understanding and agreed to proceed.  Pt is at home.  Provider is at office   Her anxiety/depression is stable - she is having trouble sleeping.  She called [facility] but didn't get an appointment because of her work.    She says she exercises regularly with her sister- walks.    Pt says she is doing well and it back to work without difficulties.   Pt did not get her labs done- she says she didn't know.    Relevant past medical, surgical, family and social history reviewed and updated as indicated. Interim medical history since our last visit reviewed. Allergies and medications reviewed and updated.   Current Outpatient Medications:  .  amLODipine (NORVASC) 10 MG tablet, TAKE 1 Tablet BY MOUTH ONCE DAILY, Disp: 90 tablet, Rfl: 0 .  aspirin 325 MG tablet, Take 325 mg by mouth daily., Disp: , Rfl:  .  atorvastatin (LIPITOR) 40 MG tablet, TAKE 1 Tablet BY MOUTH EVERY NIGHT AT BEDTIME, Disp: 90 tablet, Rfl: 0 .  hydrochlorothiazide (HYDRODIURIL) 25 MG tablet, TAKE 1 Tablet BY MOUTH ONCE DAILY, Disp: 90 tablet, Rfl: 0 .  lisinopril (ZESTRIL) 10 MG tablet, TAKE 1 Tablet BY MOUTH ONCE DAILY, Disp: 90 tablet, Rfl: 0 .  hydrOXYzine (ATARAX/VISTARIL) 25 MG tablet, Take 25 mg by mouth 3 (three) times daily as needed (rest)., Disp: , Rfl:  .   promethazine (PHENERGAN) 25 MG tablet, Take 1 tablet (25 mg total) by mouth every 8 (eight) hours as needed for nausea or vomiting. (Patient not taking: Reported on 12/28/2018), Disp: 10 tablet, Rfl: 0    Review of Systems  Per HPI unless specifically indicated above     Objective:    There were no vitals taken for this visit.  Wt Readings from Last 3 Encounters:  07/28/18 276 lb (125.2 kg)  04/20/18 273 lb 12 oz (124.2 kg)  03/21/18 274 lb 8 oz (124.5 kg)    Physical Exam Pulmonary:     Effort: No respiratory distress.  Neurological:     Mental Status: She is alert and oriented to person, place, and time.  Psychiatric:        Attention and Perception: Attention normal.        Speech: Speech normal.        Behavior: Behavior is cooperative.          Assessment & Plan:    Encounter Diagnoses  Name Primary?  . Essential hypertension Yes  . Hyperlipidemia, unspecified hyperlipidemia type   . Mood disorder ([place])     -pt to get fasting labs drawn.  She will be called with results -pt to continue current medications -encouraged pt to get appointment with [facility] if needed.  Told her that her employer would have to understand as mental  health care is important just as [facility].   -pt to follow up in 3 months.  She is to contact office sooner prn

## 2019-02-13 ENCOUNTER — Other Ambulatory Visit: Payer: Self-pay | Admitting: Physician Assistant

## 2019-03-13 ENCOUNTER — Other Ambulatory Visit: Payer: Self-pay | Admitting: Physician Assistant

## 2019-03-13 MED ORDER — AMLODIPINE BESYLATE 10 MG PO TABS: 10.0000 mg | ORAL_TABLET | Freq: Every day | ORAL | Status: DC

## 2019-03-13 MED ORDER — ATORVASTATIN CALCIUM 40 MG PO TABS: 40.0000 mg | ORAL_TABLET | Freq: Every day | ORAL | 1 refills | Status: DC

## 2019-03-13 MED ORDER — LISINOPRIL 10 MG PO TABS: 10.0000 mg | ORAL_TABLET | Freq: Every day | ORAL | 1 refills | Status: DC

## 2019-03-13 MED ORDER — HYDROCHLOROTHIAZIDE 25 MG PO TABS: 25.0000 mg | ORAL_TABLET | Freq: Every day | ORAL | 1 refills | Status: DC

## 2019-04-11 ENCOUNTER — Ambulatory Visit: Payer: Self-pay | Admitting: Physician Assistant

## 2019-04-12 ENCOUNTER — Other Ambulatory Visit (HOSPITAL_COMMUNITY)
Admission: RE | Admit: 2019-04-12 | Discharge: 2019-04-12 | Disposition: A | Discharge status: Home / Self Care | Payer: Self-pay | Source: Ambulatory Visit | Attending: Physician Assistant | Admitting: Physician Assistant

## 2019-04-12 DIAGNOSIS — E876 Hypokalemia: Secondary | ICD-10-CM

## 2019-04-12 DIAGNOSIS — E785 Hyperlipidemia, unspecified: Secondary | ICD-10-CM | POA: Insufficient documentation

## 2019-04-12 DIAGNOSIS — I10 Essential (primary) hypertension: Secondary | ICD-10-CM | POA: Insufficient documentation

## 2019-04-12 LAB — LIPID PANEL
Cholesterol: 104 mg/dL (ref 0–200)
HDL: 41 mg/dL (ref >40)
LDL Cholesterol: 32 mg/dL (ref 0–99)
Total CHOL/HDL Ratio: 2.5 RATIO
Triglycerides: 156 mg/dL — ABNORMAL HIGH (ref <150)
VLDL: 31 mg/dL (ref 0–40)

## 2019-04-12 LAB — COMPREHENSIVE METABOLIC PANEL
ALT: 21 U/L (ref 0–44)
AST: 21 U/L (ref 15–41)
Albumin: 3.9 g/dL (ref 3.5–5.0)
Alkaline Phosphatase: 74 U/L (ref 38–126)
Anion gap: 8 (ref 5–15)
BUN: 16 mg/dL (ref 6–20)
CO2: 27 mmol/L (ref 22–32)
Calcium: 8.9 mg/dL (ref 8.9–10.3)
Chloride: 102 mmol/L (ref 98–111)
Creatinine, Ser: 0.82 mg/dL (ref 0.44–1.00)
GFR calc Af Amer: >60 mL/min (ref >60)
GFR calc non Af Amer: >60 mL/min (ref >60)
Glucose, Bld: 91 mg/dL (ref 70–99)
Potassium: 3.2 mmol/L — ABNORMAL LOW (ref 3.5–5.1)
Sodium: 137 mmol/L (ref 135–145)
Total Bilirubin: 1 mg/dL (ref 0.3–1.2)
Total Protein: 7.9 g/dL (ref 6.5–8.1)

## 2019-04-13 ENCOUNTER — Other Ambulatory Visit: Payer: Self-pay

## 2019-04-13 ENCOUNTER — Ambulatory Visit: Payer: Self-pay | Admitting: Physician Assistant

## 2019-04-13 ENCOUNTER — Encounter: Payer: Self-pay | Admitting: Physician Assistant

## 2019-04-13 VITALS — BP 136/88 | HR 72 | Temp 98.1°F

## 2019-04-13 DIAGNOSIS — Z1211 Encounter for screening for malignant neoplasm of colon: Secondary | ICD-10-CM

## 2019-04-13 DIAGNOSIS — I10 Essential (primary) hypertension: Secondary | ICD-10-CM

## 2019-04-13 DIAGNOSIS — M722 Plantar fascial fibromatosis: Secondary | ICD-10-CM

## 2019-04-13 DIAGNOSIS — E876 Hypokalemia: Secondary | ICD-10-CM

## 2019-04-13 DIAGNOSIS — E785 Hyperlipidemia, unspecified: Secondary | ICD-10-CM

## 2019-04-13 MED ORDER — POTASSIUM CHLORIDE ER 10 MEQ PO TBCR: EXTENDED_RELEASE_TABLET | ORAL | 0 refills | Status: DC

## 2019-04-13 NOTE — Progress Notes (Signed)
BP 136/88   Pulse 72   Temp 98.1 F (36.7 C)   SpO2 98%    Subjective:    Patient ID: Barbara Snow, female    DOB: April 02, 1966, 53 y.o.   MRN: [id number]  HPI: Barbara Snow is a 53 y.o. female presenting on 04/13/2019 for Hypertension and Hyperlipidemia   HPI    Pt had negative covid 19 screening questionnaire   Pt is still working at [employer] home.  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  Pt out of meds.  She says she just hasn't had time to get them refilled.  Pt complains of L heels hurting 2 months.   No injury.    She says her mood is good   Relevant past medical, surgical, family and social history reviewed and updated as indicated. Interim medical history since our last visit reviewed. Allergies and medications reviewed and updated.   Current Outpatient Medications:  .  aspirin 81 MG chewable tablet, Chew 81 mg by mouth daily., Disp: , Rfl:  .  atorvastatin (LIPITOR) 40 MG tablet, Take 1 tablet (40 mg total) by mouth at bedtime., Disp: 30 tablet, Rfl: 1 .  hydrochlorothiazide (HYDRODIURIL) 25 MG tablet, Take 1 tablet (25 mg total) by mouth daily., Disp: 30 tablet, Rfl: 1 .  lisinopril (ZESTRIL) 10 MG tablet, Take 1 tablet (10 mg total) by mouth daily., Disp: 30 tablet, Rfl: 1 .  amLODipine (NORVASC) 10 MG tablet, Take 1 tablet (10 mg total) by mouth daily. (Patient not taking: Reported on 04/13/2019), Disp: 30 tablet, Rfl: 01 .  aspirin 325 MG tablet, Take 325 mg by mouth daily., Disp: , Rfl:  .  hydrOXYzine (ATARAX/VISTARIL) 25 MG tablet, Take 25 mg by mouth 3 (three) times daily as needed (rest)., Disp: , Rfl:  .  promethazine (PHENERGAN) 25 MG tablet, Take 1 tablet (25 mg total) by mouth every 8 (eight) hours as needed for nausea or vomiting. (Patient not taking: Reported on 12/28/2018), Disp: 10 tablet, Rfl: 0    Review of Systems  Per HPI unless specifically indicated above     Objective:    BP 136/88   Pulse 72    Temp 98.1 F (36.7 C)   SpO2 98%   Wt Readings from Last 3 Encounters:  07/28/18 276 lb (125.2 kg)  04/20/18 273 lb 12 oz (124.2 kg)  03/21/18 274 lb 8 oz (124.5 kg)    Physical Exam Vitals signs reviewed.  Constitutional:      General: She is not in acute distress.    Appearance: Normal appearance. She is well-developed. She is obese. She is not ill-appearing.  HENT:     Head: Normocephalic and atraumatic.  Neck:     Musculoskeletal: Neck supple.  Cardiovascular:     Rate and Rhythm: Normal rate and regular rhythm.  Pulmonary:     Effort: Pulmonary effort is normal.     Breath sounds: Normal breath sounds.  Abdominal:     General: Bowel sounds are normal.     Palpations: Abdomen is soft. There is no mass.     Tenderness: There is no abdominal tenderness.  Musculoskeletal:     Right lower leg: No edema.     Left lower leg: No edema.     Right foot: Normal.     Left foot: Normal range of motion and normal capillary refill. Tenderness present. No swelling.     Comments: Tenderness L  heel plantar surface.  No wound or deformity  Lymphadenopathy:     Cervical: No cervical adenopathy.  Skin:    General: Skin is warm and dry.  Neurological:     Mental Status: She is alert and oriented to person, place, and time.  Psychiatric:        Attention and Perception: Attention normal.        Speech: Speech normal.        Behavior: Behavior normal. Behavior is cooperative.     Results for orders placed or performed during the hospital encounter of 04/12/19  Lipid panel  Result Value Ref Range   Cholesterol 104 0 - 200 mg/dL   Triglycerides 156 (H) <150 mg/dL   HDL 41 >40 mg/dL   Total CHOL/HDL Ratio 2.5 RATIO   VLDL 31 0 - 40 mg/dL   LDL Cholesterol 32 0 - 99 mg/dL  Comprehensive metabolic panel  Result Value Ref Range   Sodium 137 135 - 145 mmol/L   Potassium 3.2 (L) 3.5 - 5.1 mmol/L   Chloride 102 98 - 111 mmol/L   CO2 27 22 - 32 mmol/L   Glucose, Bld 91 70 - 99 mg/dL    BUN 16 6 - 20 mg/dL   Creatinine, Ser 0.82 0.44 - 1.00 mg/dL   Calcium 8.9 8.9 - 10.3 mg/dL   Total Protein 7.9 6.5 - 8.1 g/dL   Albumin 3.9 3.5 - 5.0 g/dL   AST 21 15 - 41 U/L   ALT 21 0 - 44 U/L   Alkaline Phosphatase 74 38 - 126 U/L   Total Bilirubin 1.0 0.3 - 1.2 mg/dL   GFR calc non Af Amer >60 >60 mL/min   GFR calc Af Amer >60 >60 mL/min   Anion gap 8 5 - 15      Assessment & Plan:   Encounter Diagnoses  Name Primary?  . Essential hypertension Yes  . Hyperlipidemia, unspecified hyperlipidemia type   . Hypokalemia   . Screening for colon cancer   . Morbid obesity ([place])   . Plantar fasciitis       -reviewed labs with pt -encouraged pt to avoid running out of meds -will add kdur potassium as a routine medicaion -will Recheck the potassium in 1 month -Counseled pt to get labs in more timely fashion -refer for screening Mammogram iFOBT given for colon cancer screening -counseled pt on care for her plantar fasciitis and gave reading material -will Update PAP next OV (in 6 months as she prefers telemedicine appointment in 3 months) -pt to follow up with telemedicine appointment in 3 months.  She will contact office sooner prn

## 2019-04-13 NOTE — Patient Instructions (Signed)
Plantar Fasciitis  Plantar fasciitis is a painful foot condition that affects the heel. It occurs when the band of tissue that connects the toes to the heel bone (plantar fascia) becomes irritated. This can happen as the result of exercising too much or doing other repetitive activities (overuse injury). The pain from plantar fasciitis can range from mild irritation to severe pain that makes it difficult to walk or move. The pain is usually worse in the morning after sleeping, or after sitting or lying down for a while. Pain may also be worse after long periods of walking or standing. What are the causes? This condition may be caused by:  Standing for long periods of time.  Wearing shoes that do not have good arch support.  Doing activities that put stress on joints (high-impact activities), including running, aerobics, and ballet.  Being overweight.  An abnormal way of walking (gait).  Tight muscles in the back of your lower leg (calf).  High arches in your feet.  Starting a new athletic activity. What are the signs or symptoms? The main symptom of this condition is heel pain. Pain may:  Be worse with first steps after a time of rest, especially in the morning after sleeping or after you have been sitting or lying down for a while.  Be worse after long periods of standing still.  Decrease after 30-45 minutes of activity, such as gentle walking. How is this diagnosed? This condition may be diagnosed based on your medical history and your symptoms. Your health care provider may ask questions about your activity level. Your health care provider will do a physical exam to check for:  A tender area on the bottom of your foot.  A high arch in your foot.  Pain when you move your foot.  Difficulty moving your foot. You may have imaging tests to confirm the diagnosis, such as:  X-rays.  Ultrasound.  MRI. How is this treated? Treatment for plantar fasciitis depends on how  severe your condition is. Treatment may include:  Rest, ice, applying pressure (compression), and raising the affected foot (elevation). This may be called RICE therapy. Your health care provider may recommend RICE therapy along with over-the-counter pain medicines to manage your pain.  Exercises to stretch your calves and your plantar fascia.  A splint that holds your foot in a stretched, upward position while you sleep (night splint).  Physical therapy to relieve symptoms and prevent problems in the future.  Injections of steroid medicine (cortisone) to relieve pain and inflammation.  Stimulating your plantar fascia with electrical impulses (extracorporeal shock wave therapy). This is usually the last treatment option before surgery.  Surgery, if other treatments have not worked after 12 months. Follow these instructions at home:  Managing pain, stiffness, and swelling  If directed, put ice on the painful area: ? Put ice in a plastic bag, or use a frozen bottle of water. ? Place a towel between your skin and the bag or bottle. ? Roll the bottom of your foot over the bag or bottle. ? Do this for 20 minutes, 2-3 times a day.  Wear athletic shoes that have air-sole or gel-sole cushions, or try wearing soft shoe inserts that are designed for plantar fasciitis.  Raise (elevate) your foot above the level of your heart while you are sitting or lying down. Activity  Avoid activities that cause pain. Ask your health care provider what activities are safe for you.  Do physical therapy exercises and stretches as told   by your health care provider.  Try activities and forms of exercise that are easier on your joints (low-impact). Examples include swimming, water aerobics, and biking. General instructions  Take over-the-counter and prescription medicines only as told by your health care provider.  Wear a night splint while sleeping, if told by your health care provider. Loosen the splint  if your toes tingle, become numb, or turn cold and blue.  Maintain a healthy weight, or work with your health care provider to lose weight as needed.  Keep all follow-up visits as told by your health care provider. This is important. Contact a health care provider if you:  Have symptoms that do not go away after caring for yourself at home.  Have pain that gets worse.  Have pain that affects your ability to move or do your daily activities. Summary  Plantar fasciitis is a painful foot condition that affects the heel. It occurs when the band of tissue that connects the toes to the heel bone (plantar fascia) becomes irritated.  The main symptom of this condition is heel pain that may be worse after exercising too much or standing still for a long time.  Treatment varies, but it usually starts with rest, ice, compression, and elevation (RICE therapy) and over-the-counter medicines to manage pain. This information is not intended to replace advice given to you by your health care provider. Make sure you discuss any questions you have with your health care provider. Document Released: 03/10/2001 Document Revised: 05/28/2017 Document Reviewed: 04/12/2017 Elsevier Patient Education  2020 Elsevier Inc.  

## 2019-04-15 ENCOUNTER — Other Ambulatory Visit: Payer: Self-pay | Admitting: Physician Assistant

## 2019-04-15 DIAGNOSIS — Z1211 Encounter for screening for malignant neoplasm of colon: Secondary | ICD-10-CM

## 2019-06-03 ENCOUNTER — Other Ambulatory Visit: Payer: Self-pay | Admitting: Physician Assistant

## 2019-06-05 ENCOUNTER — Ambulatory Visit (HOSPITAL_COMMUNITY): Payer: Self-pay

## 2019-06-05 ENCOUNTER — Encounter (HOSPITAL_COMMUNITY): Payer: Self-pay

## 2019-06-07 ENCOUNTER — Other Ambulatory Visit (HOSPITAL_COMMUNITY)
Admission: RE | Admit: 2019-06-07 | Discharge: 2019-06-07 | Disposition: A | Discharge status: Home / Self Care | Payer: Self-pay | Source: Ambulatory Visit | Attending: Physician Assistant | Admitting: Physician Assistant

## 2019-06-07 DIAGNOSIS — E876 Hypokalemia: Secondary | ICD-10-CM | POA: Insufficient documentation

## 2019-06-07 LAB — POTASSIUM: Potassium: 3.2 mmol/L — ABNORMAL LOW (ref 3.5–5.1)

## 2019-06-12 ENCOUNTER — Other Ambulatory Visit: Payer: Self-pay | Admitting: Physician Assistant

## 2019-06-12 MED ORDER — LISINOPRIL 10 MG PO TABS: 10.0000 mg | ORAL_TABLET | Freq: Every day | ORAL | 1 refills | Status: DC

## 2019-06-12 MED ORDER — ATORVASTATIN CALCIUM 40 MG PO TABS: 40.0000 mg | ORAL_TABLET | Freq: Every day | ORAL | 1 refills | Status: DC

## 2019-06-12 MED ORDER — HYDROCHLOROTHIAZIDE 25 MG PO TABS: 25.0000 mg | ORAL_TABLET | Freq: Every day | ORAL | 1 refills | Status: DC

## 2019-06-12 MED ORDER — POTASSIUM CHLORIDE ER 10 MEQ PO TBCR: EXTENDED_RELEASE_TABLET | ORAL | 1 refills | Status: DC

## 2019-06-15 ENCOUNTER — Other Ambulatory Visit: Payer: Self-pay | Admitting: Physician Assistant

## 2019-06-15 DIAGNOSIS — E876 Hypokalemia: Secondary | ICD-10-CM

## 2019-06-15 DIAGNOSIS — E785 Hyperlipidemia, unspecified: Secondary | ICD-10-CM

## 2019-06-15 DIAGNOSIS — I10 Essential (primary) hypertension: Secondary | ICD-10-CM

## 2019-07-04 ENCOUNTER — Ambulatory Visit: Payer: Self-pay | Attending: Internal Medicine

## 2019-07-04 ENCOUNTER — Other Ambulatory Visit: Payer: Self-pay

## 2019-07-04 DIAGNOSIS — Z20822 Contact with and (suspected) exposure to covid-19: Secondary | ICD-10-CM

## 2019-07-06 ENCOUNTER — Telehealth: Payer: Self-pay

## 2019-07-06 LAB — NOVEL CORONAVIRUS, NAA: SARS-CoV-2, NAA: NOT DETECTED

## 2019-07-06 NOTE — Telephone Encounter (Signed)
Patient called requesting COVID-19 res result fax to employer  [employer]. Att Toya Pettie. [phone].

## 2019-07-25 ENCOUNTER — Ambulatory Visit: Payer: Self-pay | Admitting: Physician Assistant

## 2019-07-25 ENCOUNTER — Encounter: Payer: Self-pay | Admitting: Physician Assistant

## 2019-07-25 DIAGNOSIS — R059 Cough, unspecified: Secondary | ICD-10-CM

## 2019-07-25 DIAGNOSIS — I10 Essential (primary) hypertension: Secondary | ICD-10-CM

## 2019-07-25 DIAGNOSIS — R05 Cough: Secondary | ICD-10-CM

## 2019-07-25 DIAGNOSIS — E785 Hyperlipidemia, unspecified: Secondary | ICD-10-CM

## 2019-07-25 MED ORDER — BENZONATATE 100 MG PO CAPS: ORAL_CAPSULE | ORAL | 3 refills | Status: DC

## 2019-07-25 NOTE — Progress Notes (Signed)
There were no vitals taken for this visit.   Subjective:    Patient ID: Barbara Snow, female    DOB: [birth year], 54 y.o.   MRN: [id number]  HPI: Barbara Snow is a 54 y.o. female presenting on 07/25/2019 for No chief complaint on file.   HPI   This is a telemedicine appointment due to coronavirus pandemic.  It is via telephone as pt does not have a video enabled device.  I connected with  Barbara Snow on 07/25/19 by a video enabled telemedicine application and verified that I am speaking with the correct person using two identifiers.   I discussed the limitations of evaluation and management by telemedicine. The patient expressed understanding and agreed to proceed.  Pt is at home.  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   Pt is 75yoF with history of HTN.   Pt requested virtual appointment today but she has not checked her bp lately as requested.  She says she will check today when returns to work later today.   She is still working at [employer].  Pt did not get her labs drawn.   Pt has never gotten her amlodipine rx filled.    Pt requests something for cough-   Cough - about 3 wk- had negative covid test- she thinks the coughing is due to her daughter smoking.  Her daughter recently moved in with her and she smokes inside the home.    Pt says she is feeling well and has no complaints other than the cough.       Relevant past medical, surgical, family and social history reviewed and updated as indicated. Interim medical history since our last visit reviewed. Allergies and medications reviewed and updated.   Current Outpatient Medications:  .  aspirin 325 MG tablet, Take 325 mg by mouth daily., Disp: , Rfl:  .  atorvastatin (LIPITOR) 40 MG tablet, Take 1 tablet (40 mg total) by mouth at bedtime., Disp: 30 tablet, Rfl: 1 .  hydrochlorothiazide (HYDRODIURIL) 25 MG tablet, Take 1 tablet (25 mg total) by mouth daily., Disp: 30 tablet, Rfl: 1 .  lisinopril (ZESTRIL) 10 MG tablet, Take 1  tablet (10 mg total) by mouth daily., Disp: 30 tablet, Rfl: 1 .  potassium chloride (KLOR-CON) 10 MEQ tablet, TAKE 1 TABLET BY MOUTH ONCE DAILy, Disp: 30 tablet, Rfl: 1 .  amLODipine (NORVASC) 10 MG tablet, Take 1 tablet (10 mg total) by mouth daily. (Patient not taking: Reported on 04/13/2019), Disp: 30 tablet, Rfl: 01 .  hydrOXYzine (ATARAX/VISTARIL) 25 MG tablet, Take 25 mg by mouth 3 (three) times daily as needed (rest)., Disp: , Rfl:  .  promethazine (PHENERGAN) 25 MG tablet, Take 1 tablet (25 mg total) by mouth every 8 (eight) hours as needed for nausea or vomiting. (Patient not taking: Reported on 12/28/2018), Disp: 10 tablet, Rfl: 0    Review of Systems  Per HPI unless specifically indicated above     Objective:    There were no vitals taken for this visit.  Wt Readings from Last 3 Encounters:  07/28/18 276 lb (125.2 kg)  04/20/18 273 lb 12 oz (124.2 kg)  03/21/18 274 lb 8 oz (124.5 kg)    Physical Exam Pulmonary:     Effort: Pulmonary effort is normal. No respiratory distress.  Neurological:     Mental Status: She is alert and oriented to person, place, and time.  Psychiatric:        Attention and Perception: Attention normal.  Speech: Speech normal.        Behavior: Behavior is cooperative.           Assessment & Plan:    Encounter Diagnoses  Name Primary?  . Essential hypertension Yes  . Hyperlipidemia, unspecified hyperlipidemia type   . Cough   \   -pt is given rx for tessalon.for cough -Pt to call with bp-  Will determin to fill amlodioin or not based on this.   -pt to get her fasting labs drawn.  She will be called with results -discussed importance of monitoring the bp with pt -she will follow up in 3 months.  She is to contact office sooner prn   Addendum: Pt called later in the afternoon and said that the bp machine at work was broken.  She said she should be able to call tomorrow with reading

## 2019-07-26 ENCOUNTER — Other Ambulatory Visit: Payer: Self-pay | Admitting: Physician Assistant

## 2019-07-26 ENCOUNTER — Telehealth: Payer: Self-pay | Admitting: Student

## 2019-07-26 MED ORDER — LISINOPRIL 10 MG PO TABS: 10.0000 mg | ORAL_TABLET | Freq: Every day | ORAL | 1 refills | Status: DC

## 2019-07-26 MED ORDER — AMLODIPINE BESYLATE 10 MG PO TABS: 10.0000 mg | ORAL_TABLET | Freq: Every day | ORAL | Status: DC

## 2019-07-26 NOTE — Telephone Encounter (Addendum)
Pt called to give her bp reading as she did not have this available at the time of her appt yesterday 07-25-19. BP was 185/103.  PA sent refills for amlodipine and lisinopril to [facility] in [place]. PA did not refill hctz as pt's last K+ was abnormal and needs to be rechecked. Pt is to go to [facility] to get fasting labs drawn before hctz can be refilled.  Pt f/u appt to be r/s for EV in 1 month.  LPN called pt back on 07-26-19 and left vm for pt to call back regarding above. LPN will call again at a later time.

## 2019-07-26 NOTE — Telephone Encounter (Signed)
Pt called back and was notified of medications being sent and to get fasting labs. Pt verbalized understanding.  Pt's appt was r/s to Feb. 25, 2021.

## 2019-08-24 ENCOUNTER — Ambulatory Visit: Payer: Self-pay | Admitting: Physician Assistant

## 2019-08-30 ENCOUNTER — Ambulatory Visit: Payer: Self-pay | Admitting: Physician Assistant

## 2019-09-04 ENCOUNTER — Other Ambulatory Visit (HOSPITAL_COMMUNITY)
Admission: RE | Admit: 2019-09-04 | Discharge: 2019-09-04 | Disposition: A | Discharge status: Home / Self Care | Payer: Self-pay | Source: Ambulatory Visit | Attending: Physician Assistant | Admitting: Physician Assistant

## 2019-09-04 DIAGNOSIS — Z1239 Encounter for other screening for malignant neoplasm of breast: Secondary | ICD-10-CM

## 2019-09-04 DIAGNOSIS — E876 Hypokalemia: Secondary | ICD-10-CM

## 2019-09-04 DIAGNOSIS — E785 Hyperlipidemia, unspecified: Secondary | ICD-10-CM

## 2019-09-04 DIAGNOSIS — I10 Essential (primary) hypertension: Secondary | ICD-10-CM

## 2019-09-04 LAB — COMPREHENSIVE METABOLIC PANEL
ALT: 21 U/L (ref 0–44)
AST: 16 U/L (ref 15–41)
Albumin: 3.9 g/dL (ref 3.5–5.0)
Alkaline Phosphatase: 97 U/L (ref 38–126)
Anion gap: 10 (ref 5–15)
BUN: 23 mg/dL — ABNORMAL HIGH (ref 6–20)
CO2: 28 mmol/L (ref 22–32)
Calcium: 9.5 mg/dL (ref 8.9–10.3)
Chloride: 100 mmol/L (ref 98–111)
Creatinine, Ser: 0.86 mg/dL (ref 0.44–1.00)
GFR calc Af Amer: >60 mL/min (ref >60)
GFR calc non Af Amer: >60 mL/min (ref >60)
Glucose, Bld: 99 mg/dL (ref 70–99)
Potassium: 3.6 mmol/L (ref 3.5–5.1)
Sodium: 138 mmol/L (ref 135–145)
Total Bilirubin: 0.8 mg/dL (ref 0.3–1.2)
Total Protein: 8.3 g/dL — ABNORMAL HIGH (ref 6.5–8.1)

## 2019-09-04 LAB — LIPID PANEL
Cholesterol: 204 mg/dL — ABNORMAL HIGH (ref 0–200)
HDL: 50 mg/dL (ref >40)
LDL Cholesterol: 124 mg/dL — ABNORMAL HIGH (ref 0–99)
Total CHOL/HDL Ratio: 4.1 RATIO
Triglycerides: 151 mg/dL — ABNORMAL HIGH (ref <150)
VLDL: 30 mg/dL (ref 0–40)

## 2019-09-11 ENCOUNTER — Ambulatory Visit: Payer: Self-pay | Admitting: Physician Assistant

## 2019-09-11 DIAGNOSIS — I10 Essential (primary) hypertension: Secondary | ICD-10-CM

## 2019-09-11 DIAGNOSIS — E785 Hyperlipidemia, unspecified: Secondary | ICD-10-CM

## 2019-09-11 NOTE — Progress Notes (Signed)
There were no vitals taken for this visit.   Subjective:    Patient ID: Barbara Snow, female    DOB: 16-Nov-1965, 54 y.o.   MRN: [id number]  HPI: Barbara Snow is a 54 y.o. female presenting on 09/11/2019 for No chief complaint on file.   HPI   This is a telemedicine appointment due to coronavirus pandemic.  It is via Telephone as pt does not have a video enabled device.  I connected with  Barbara Snow on 09/11/19 by a video enabled telemedicine application and verified that I am speaking with the correct person using two identifiers.   I discussed the limitations of evaluation and management by telemedicine. The patient expressed understanding and agreed to proceed.  Pt is at work.  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   Pt is 67yoF with HTN and dyslipidemia.  Pt says she likes having virtual appointments but again today she hasn't checked her BP.  She says she hasn't checked it in over a week now..     Relevant past medical, surgical, family and social history reviewed and updated as indicated. Interim medical history since our last visit reviewed. Allergies and medications reviewed and updated.   Current Outpatient Medications:  .  amLODipine (NORVASC) 10 MG tablet, Take 1 tablet (10 mg total) by mouth daily., Disp: 30 tablet, Rfl: 01 .  aspirin 325 MG tablet, Take 325 mg by mouth daily., Disp: , Rfl:  .  atorvastatin (LIPITOR) 40 MG tablet, Take 1 tablet (40 mg total) by mouth at bedtime., Disp: 30 tablet, Rfl: 1 .  benzonatate (TESSALON PERLES) 100 MG capsule, 1 - 2 po q 8 hour prn cough, Disp: 20 capsule, Rfl: 3 .  hydrochlorothiazide (HYDRODIURIL) 25 MG tablet, Take 1 tablet (25 mg total) by mouth daily., Disp: 30 tablet, Rfl: 1 .  lisinopril (ZESTRIL) 10 MG tablet, Take 1 tablet (10 mg total) by mouth daily., Disp: 30 tablet, Rfl: 1 .  hydrOXYzine (ATARAX/VISTARIL) 25 MG tablet, Take 25 mg by mouth 3 (three) times daily as needed (rest)., Disp: , Rfl:  .  potassium chloride (KLOR-CON)  10 MEQ tablet, TAKE 1 TABLET BY MOUTH ONCE DAILy, Disp: 30 tablet, Rfl: 1 .  promethazine (PHENERGAN) 25 MG tablet, Take 1 tablet (25 mg total) by mouth every 8 (eight) hours as needed for nausea or vomiting. (Patient not taking: Reported on 12/28/2018), Disp: 10 tablet, Rfl: 0   Review of Systems  Per HPI unless specifically indicated above     Objective:    There were no vitals taken for this visit.  Wt Readings from Last 3 Encounters:  07/28/18 276 lb (125.2 kg)  04/20/18 273 lb 12 oz (124.2 kg)  03/21/18 274 lb 8 oz (124.5 kg)    Physical Exam Pulmonary:     Effort: No respiratory distress.  Neurological:     Mental Status: She is alert and oriented to person, place, and time.  Psychiatric:        Attention and Perception: Attention normal.        Speech: Speech normal.        Behavior: Behavior is cooperative.     Results for orders placed or performed during the hospital encounter of 09/04/19  Lipid panel  Result Value Ref Range   Cholesterol 204 (H) 0 - 200 mg/dL   Triglycerides 151 (H) <150 mg/dL   HDL 50 >40 mg/dL   Total CHOL/HDL Ratio 4.1 RATIO   VLDL 30 0 - 40 mg/dL  LDL Cholesterol 124 (H) 0 - 99 mg/dL  Comprehensive metabolic panel  Result Value Ref Range   Sodium 138 135 - 145 mmol/L   Potassium 3.6 3.5 - 5.1 mmol/L   Chloride 100 98 - 111 mmol/L   CO2 28 22 - 32 mmol/L   Glucose, Bld 99 70 - 99 mg/dL   BUN 23 (H) 6 - 20 mg/dL   Creatinine, Ser 0.86 0.44 - 1.00 mg/dL   Calcium 9.5 8.9 - 10.3 mg/dL   Total Protein 8.3 (H) 6.5 - 8.1 g/dL   Albumin 3.9 3.5 - 5.0 g/dL   AST 16 15 - 41 U/L   ALT 21 0 - 44 U/L   Alkaline Phosphatase 97 38 - 126 U/L   Total Bilirubin 0.8 0.3 - 1.2 mg/dL   GFR calc non Af Amer >60 >60 mL/min   GFR calc Af Amer >60 >60 mL/min   Anion gap 10 5 - 15      Assessment & Plan:    Encounter Diagnoses  Name Primary?  . Essential hypertension Yes  . Hyperlipidemia, unspecified hyperlipidemia type      -Reviewed labs  with pt -discussed with pt that virtual appointments are not being productive due to her not checking her BP so she will have to come into the office for appointments from now on (unless sick) -refer for mammmogram -No more virutal appts.  Pt will follow up in office 1 month

## 2019-09-17 ENCOUNTER — Other Ambulatory Visit: Payer: Self-pay | Admitting: Student

## 2019-09-17 DIAGNOSIS — Z1231 Encounter for screening mammogram for malignant neoplasm of breast: Secondary | ICD-10-CM

## 2019-09-29 ENCOUNTER — Other Ambulatory Visit: Payer: Self-pay

## 2019-09-29 ENCOUNTER — Ambulatory Visit (HOSPITAL_COMMUNITY)
Admission: RE | Admit: 2019-09-29 | Discharge: 2019-09-29 | Disposition: A | Discharge status: Home / Self Care | Payer: Self-pay | Source: Ambulatory Visit | Attending: Physician Assistant | Admitting: Physician Assistant

## 2019-09-29 DIAGNOSIS — Z1231 Encounter for screening mammogram for malignant neoplasm of breast: Secondary | ICD-10-CM | POA: Insufficient documentation

## 2019-10-16 ENCOUNTER — Ambulatory Visit: Payer: Self-pay | Admitting: Physician Assistant

## 2019-10-20 ENCOUNTER — Other Ambulatory Visit: Payer: Self-pay | Admitting: Physician Assistant

## 2019-10-23 ENCOUNTER — Ambulatory Visit: Payer: Self-pay | Admitting: Physician Assistant

## 2019-10-23 ENCOUNTER — Ambulatory Visit (HOSPITAL_COMMUNITY)
Admission: RE | Admit: 2019-10-23 | Discharge: 2019-10-23 | Disposition: A | Discharge status: Home / Self Care | Payer: Self-pay | Source: Ambulatory Visit | Attending: Physician Assistant | Admitting: Physician Assistant

## 2019-10-23 ENCOUNTER — Other Ambulatory Visit: Payer: Self-pay

## 2019-10-23 ENCOUNTER — Encounter: Payer: Self-pay | Admitting: Physician Assistant

## 2019-10-23 VITALS — BP 133/83 | HR 80 | Temp 97.7°F

## 2019-10-23 DIAGNOSIS — M79671 Pain in right foot: Secondary | ICD-10-CM

## 2019-10-23 DIAGNOSIS — E785 Hyperlipidemia, unspecified: Secondary | ICD-10-CM

## 2019-10-23 DIAGNOSIS — I10 Essential (primary) hypertension: Secondary | ICD-10-CM

## 2019-10-23 MED ORDER — AMLODIPINE BESYLATE 10 MG PO TABS: 10.0000 mg | ORAL_TABLET | Freq: Every day | ORAL | 3 refills | Status: DC

## 2019-10-23 MED ORDER — LISINOPRIL 10 MG PO TABS: 10.0000 mg | ORAL_TABLET | Freq: Every day | ORAL | 3 refills | Status: DC

## 2019-10-23 NOTE — Progress Notes (Signed)
BP 133/83   Pulse 80   Temp 97.7 F (36.5 C)   SpO2 98%    Subjective:    Patient ID: Barbara Snow, female    DOB: 1966/04/25, 54 y.o.   MRN: [id number]  HPI: Barbara Snow is a 54 y.o. female presenting on 10/23/2019 for Hypertension and Foot Pain (for several months on R heel. pt states wakes up with pain and lasts throughout the whole day)   HPI     Pt had a negatie covid 19 screening questionnaire.   Pt is 83yoF with HTN, dyslipidemia and morbid obesity who presents today for follow up of HTN.    She c/o R heel pain.  She thinks it might be due to she Works long hours-   She ices the heel and take IBU.-  it Helps a little bit.    She is off today.    Relevant past medical, surgical, family and social history reviewed and updated as indicated. Interim medical history since our last visit reviewed. Allergies and medications reviewed and updated.   Current Outpatient Medications:  .  amLODipine (NORVASC) 10 MG tablet, Take 1 tablet (10 mg total) by mouth daily., Disp: 30 tablet, Rfl: 01 .  aspirin 325 MG tablet, Take 325 mg by mouth daily., Disp: , Rfl:  .  atorvastatin (LIPITOR) 40 MG tablet, TAKE 1 TABLET BY MOUTH AT BEDTIME, Disp: 30 tablet, Rfl: 0 .  diphenhydrAMINE (BENADRYL) 25 MG tablet, Take 25 mg by mouth every 6 (six) hours as needed., Disp: , Rfl:  .  hydrochlorothiazide (HYDRODIURIL) 25 MG tablet, Take 1 tablet (25 mg total) by mouth daily., Disp: 30 tablet, Rfl: 1 .  lisinopril (ZESTRIL) 10 MG tablet, Take 1 tablet (10 mg total) by mouth daily., Disp: 30 tablet, Rfl: 1 .  potassium chloride (KLOR-CON) 10 MEQ tablet, TAKE 1 TABLET BY MOUTH ONCE DAILy, Disp: 30 tablet, Rfl: 1 .  benzonatate (TESSALON PERLES) 100 MG capsule, 1 - 2 po q 8 hour prn cough (Patient not taking: Reported on 10/23/2019), Disp: 20 capsule, Rfl: 3 .  hydrOXYzine (ATARAX/VISTARIL) 25 MG tablet, Take 25 mg by mouth 3 (three) times daily as needed (rest)., Disp: , Rfl:  .  promethazine  (PHENERGAN) 25 MG tablet, Take 1 tablet (25 mg total) by mouth every 8 (eight) hours as needed for nausea or vomiting. (Patient not taking: Reported on 12/28/2018), Disp: 10 tablet, Rfl: 0     Review of Systems  Per HPI unless specifically indicated above     Objective:    BP 133/83   Pulse 80   Temp 97.7 F (36.5 C)   SpO2 98%   Wt Readings from Last 3 Encounters:  07/28/18 276 lb (125.2 kg)  04/20/18 273 lb 12 oz (124.2 kg)  03/21/18 274 lb 8 oz (124.5 kg)    Physical Exam Vitals reviewed.  Constitutional:      General: She is not in acute distress.    Appearance: She is well-developed. She is obese. She is not toxic-appearing.  HENT:     Head: Normocephalic and atraumatic.  Cardiovascular:     Rate and Rhythm: Normal rate and regular rhythm.  Pulmonary:     Effort: Pulmonary effort is normal. No respiratory distress.     Breath sounds: Normal breath sounds.  Abdominal:     General: Bowel sounds are normal.     Palpations: Abdomen is soft. There is no mass.     Tenderness: There is no  abdominal tenderness.  Musculoskeletal:     Cervical back: Neck supple.     Right lower leg: No edema.     Left lower leg: No edema.     Right foot: Normal range of motion and normal capillary refill. Tenderness present. No swelling or deformity. Normal pulse.     Comments: MILD non-point tenderness right foot -  Seems greatest medial surface inferior to malleolus.  No plantar tenderness.  Pt hobbling around almost unable to walk because it hurts so bad  Lymphadenopathy:     Cervical: No cervical adenopathy.  Skin:    General: Skin is warm and dry.  Neurological:     Mental Status: She is alert and oriented to person, place, and time.  Psychiatric:        Attention and Perception: Attention normal.        Speech: Speech normal.        Behavior: Behavior normal. Behavior is cooperative.           Assessment & Plan:   Encounter Diagnoses  Name Primary?  . Essential  hypertension Yes  . Right foot pain   . Hyperlipidemia, unspecified hyperlipidemia type   . Morbid obesity ([place])     -will get Xray foot.  Recommended ice, elevation, ibuprofen, weight loss.  Gave [facility] note for today and tomorrow -continue current medications for HTN and lipids -pt to follow up 3 months.  She is to contact office sooner prn

## 2019-10-27 ENCOUNTER — Ambulatory Visit (HOSPITAL_COMMUNITY): Payer: Self-pay

## 2019-10-30 ENCOUNTER — Other Ambulatory Visit: Payer: Self-pay

## 2019-10-30 ENCOUNTER — Ambulatory Visit (HOSPITAL_COMMUNITY)
Admission: RE | Admit: 2019-10-30 | Discharge: 2019-10-30 | Disposition: A | Discharge status: Home / Self Care | Payer: Self-pay | Source: Ambulatory Visit | Attending: Physician Assistant | Admitting: Physician Assistant

## 2019-10-30 DIAGNOSIS — Z1231 Encounter for screening mammogram for malignant neoplasm of breast: Secondary | ICD-10-CM | POA: Insufficient documentation

## 2019-10-31 ENCOUNTER — Other Ambulatory Visit (HOSPITAL_COMMUNITY): Payer: Self-pay | Admitting: Physician Assistant

## 2019-10-31 DIAGNOSIS — R928 Other abnormal and inconclusive findings on diagnostic imaging of breast: Secondary | ICD-10-CM

## 2019-11-07 ENCOUNTER — Other Ambulatory Visit: Payer: Self-pay

## 2019-11-07 ENCOUNTER — Ambulatory Visit (HOSPITAL_COMMUNITY)
Admission: RE | Admit: 2019-11-07 | Discharge: 2019-11-07 | Disposition: A | Discharge status: Home / Self Care | Payer: Self-pay | Source: Ambulatory Visit | Attending: Physician Assistant | Admitting: Physician Assistant

## 2019-11-07 DIAGNOSIS — R928 Other abnormal and inconclusive findings on diagnostic imaging of breast: Secondary | ICD-10-CM

## 2019-12-01 ENCOUNTER — Other Ambulatory Visit: Payer: Self-pay | Admitting: Physician Assistant

## 2020-01-04 ENCOUNTER — Other Ambulatory Visit: Payer: Self-pay | Admitting: Physician Assistant

## 2020-01-22 ENCOUNTER — Encounter: Payer: Self-pay | Admitting: Physician Assistant

## 2020-01-22 ENCOUNTER — Other Ambulatory Visit: Payer: Self-pay

## 2020-01-22 ENCOUNTER — Ambulatory Visit: Payer: Self-pay | Admitting: Physician Assistant

## 2020-01-22 VITALS — BP 136/78 | HR 77 | Temp 98.1°F | Ht 63.25 in | Wt 281.5 lb

## 2020-01-22 DIAGNOSIS — Z91199 Patient's noncompliance with other medical treatment and regimen due to unspecified reason: Secondary | ICD-10-CM

## 2020-01-22 DIAGNOSIS — I10 Essential (primary) hypertension: Secondary | ICD-10-CM

## 2020-01-22 DIAGNOSIS — E785 Hyperlipidemia, unspecified: Secondary | ICD-10-CM

## 2020-01-22 DIAGNOSIS — M79671 Pain in right foot: Secondary | ICD-10-CM

## 2020-01-22 MED ORDER — ATORVASTATIN CALCIUM 40 MG PO TABS: 40.0000 mg | ORAL_TABLET | Freq: Every day | ORAL | 2 refills | Status: DC

## 2020-01-22 NOTE — Progress Notes (Signed)
BP (!) 136/78   Pulse 77   Temp 98.1 F (36.7 C)   SpO2 97%    Subjective:    Patient ID: Barbara Snow, female    DOB: May 04, 1966, 54 y.o.   MRN: [id number]  HPI: Barbara Snow is a 54 y.o. female presenting on 01/22/2020 for Hypertension, Hyperlipidemia, and Foot Pain (bilateral feet)   HPI    Pt had a negative covid 19 screening questionnaire.      Pt is 86yoF with HTN and dyslipidemia.  Pt didn't get labs drawn  She's been out of her lipitor for a week  She got her covid vaccination  She is working a lot- at [employer].  She has tried some things like was discussed at last appointment for her feet hurting.    She wears inserts and avoids bare-feet.   She is still having a lot of pain, however.  She had negative foot xray  She didn't submit CAFA / [employer] financial assistance as instructed.    Relevant past medical, surgical, family and social history reviewed and updated as indicated. Interim medical history since our last visit reviewed. Allergies and medications reviewed and updated.   Current Outpatient Medications:  .  amLODipine (NORVASC) 10 MG tablet, Take 1 tablet (10 mg total) by mouth daily., Disp: 30 tablet, Rfl: 3 .  aspirin 325 MG tablet, Take 325 mg by mouth daily., Disp: , Rfl:  .  diphenhydrAMINE (BENADRYL) 25 MG tablet, Take 25 mg by mouth every 6 (six) hours as needed., Disp: , Rfl:  .  hydrochlorothiazide (HYDRODIURIL) 25 MG tablet, Take 1 tablet (25 mg total) by mouth daily., Disp: 30 tablet, Rfl: 1 .  lisinopril (ZESTRIL) 10 MG tablet, Take 1 tablet by mouth once daily, Disp: 30 tablet, Rfl: 0 .  naproxen sodium (ALEVE) 220 MG tablet, Take 220 mg by mouth daily as needed., Disp: , Rfl:  .  potassium chloride (KLOR-CON) 10 MEQ tablet, Take 1 tablet by mouth once daily, Disp: 30 tablet, Rfl: 0 .  atorvastatin (LIPITOR) 40 MG tablet, TAKE 1 TABLET BY MOUTH AT BEDTIME (Patient not taking: Reported on 01/22/2020), Disp: 30 tablet,  Rfl: 2 .  benzonatate (TESSALON PERLES) 100 MG capsule, 1 - 2 po q 8 hour prn cough (Patient not taking: Reported on 10/23/2019), Disp: 20 capsule, Rfl: 3 .  hydrOXYzine (ATARAX/VISTARIL) 25 MG tablet, Take 25 mg by mouth 3 (three) times daily as needed (rest). (Patient not taking: Reported on 01/22/2020), Disp: , Rfl:  .  promethazine (PHENERGAN) 25 MG tablet, Take 1 tablet (25 mg total) by mouth every 8 (eight) hours as needed for nausea or vomiting. (Patient not taking: Reported on 12/28/2018), Disp: 10 tablet, Rfl: 0     Review of Systems  Per HPI unless specifically indicated above     Objective:    BP (!) 136/78   Pulse 77   Temp 98.1 F (36.7 C)   SpO2 97%   Wt Readings from Last 3 Encounters:  07/28/18 276 lb (125.2 kg)  04/20/18 273 lb 12 oz (124.2 kg)  03/21/18 274 lb 8 oz (124.5 kg)    Physical Exam Vitals reviewed.  Constitutional:      General: She is not in acute distress.    Appearance: She is well-developed. She is obese. She is not toxic-appearing.  HENT:     Head: Normocephalic and atraumatic.  Cardiovascular:     Rate and Rhythm: Normal rate and regular rhythm.  Pulses:          Dorsalis pedis pulses are 2+ on the right side and 2+ on the left side.  Pulmonary:     Effort: Pulmonary effort is normal.     Breath sounds: Normal breath sounds.  Abdominal:     General: Bowel sounds are normal.     Palpations: Abdomen is soft. There is no mass.     Tenderness: There is no abdominal tenderness.  Musculoskeletal:     Cervical back: Neck supple.     Right lower leg: No edema.     Left lower leg: No edema.     Right foot: Normal range of motion. Tenderness present. No swelling. Normal pulse.     Left foot: Normal range of motion. Tenderness present. No swelling. Normal pulse.     Comments: No point tenderness.  + tenderness bilaterally around the heel on medial and plantar surfaces  Feet:     Right foot:     Skin integrity: Skin integrity normal.     Left  foot:     Skin integrity: Skin integrity normal.  Lymphadenopathy:     Cervical: No cervical adenopathy.  Skin:    General: Skin is warm and dry.  Neurological:     Mental Status: She is alert and oriented to person, place, and time.  Psychiatric:        Behavior: Behavior normal.            Assessment & Plan:   Encounter Diagnoses  Name Primary?  . Essential hypertension Yes  . Hyperlipidemia, unspecified hyperlipidemia type   . Morbid obesity ([place])   . Foot pain, bilateral   . Noncompliance      --Pt needs follow up diagnostic mammogram and [place] 05/09/20 -Pt counseled to Get labs before follow up appointment in 1 month -Gave another CAFA/financial assistance application-  Will get care connect to contact her to help -Refer to orthopedics for persistent feet pain which may be plantar fasciitis but has not responded to conservative management.  Encouraged her to continue to wear good supportive shoes, avoid walking bare-foot, use ice on the feet. -Refill atorvastatin -Virtual appointment to follow up 1 month.  Pt to contact office sooner prn

## 2020-02-01 ENCOUNTER — Encounter (HOSPITAL_COMMUNITY): Payer: Self-pay | Admitting: Emergency Medicine

## 2020-02-01 ENCOUNTER — Emergency Department (HOSPITAL_COMMUNITY)
Admission: EM | Admit: 2020-02-01 | Discharge: 2020-02-01 | Disposition: A | Discharge status: Home / Self Care | Payer: Self-pay | Attending: Emergency Medicine | Admitting: Emergency Medicine

## 2020-02-01 ENCOUNTER — Other Ambulatory Visit: Payer: Self-pay

## 2020-02-01 ENCOUNTER — Emergency Department (HOSPITAL_COMMUNITY): Payer: Self-pay

## 2020-02-01 DIAGNOSIS — M79601 Pain in right arm: Secondary | ICD-10-CM

## 2020-02-01 DIAGNOSIS — S56911A Strain of unspecified muscles, fascia and tendons at forearm level, right arm, initial encounter: Secondary | ICD-10-CM | POA: Insufficient documentation

## 2020-02-01 DIAGNOSIS — Z79899 Other long term (current) drug therapy: Secondary | ICD-10-CM | POA: Insufficient documentation

## 2020-02-01 DIAGNOSIS — I10 Essential (primary) hypertension: Secondary | ICD-10-CM | POA: Insufficient documentation

## 2020-02-01 DIAGNOSIS — Z8673 Personal history of transient ischemic attack (TIA), and cerebral infarction without residual deficits: Secondary | ICD-10-CM | POA: Insufficient documentation

## 2020-02-01 DIAGNOSIS — J449 Chronic obstructive pulmonary disease, unspecified: Secondary | ICD-10-CM | POA: Insufficient documentation

## 2020-02-01 DIAGNOSIS — Y92009 Unspecified place in unspecified non-institutional (private) residence as the place of occurrence of the external cause: Secondary | ICD-10-CM | POA: Insufficient documentation

## 2020-02-01 DIAGNOSIS — S46211A Strain of muscle, fascia and tendon of other parts of biceps, right arm, initial encounter: Secondary | ICD-10-CM | POA: Insufficient documentation

## 2020-02-01 DIAGNOSIS — Z7982 Long term (current) use of aspirin: Secondary | ICD-10-CM | POA: Insufficient documentation

## 2020-02-01 DIAGNOSIS — Y999 Unspecified external cause status: Secondary | ICD-10-CM | POA: Insufficient documentation

## 2020-02-01 DIAGNOSIS — Y939 Activity, unspecified: Secondary | ICD-10-CM | POA: Insufficient documentation

## 2020-02-01 DIAGNOSIS — S46811A Strain of other muscles, fascia and tendons at shoulder and upper arm level, right arm, initial encounter: Secondary | ICD-10-CM | POA: Insufficient documentation

## 2020-02-01 DIAGNOSIS — T148XXA Other injury of unspecified body region, initial encounter: Secondary | ICD-10-CM

## 2020-02-01 DIAGNOSIS — W010XXA Fall on same level from slipping, tripping and stumbling without subsequent striking against object, initial encounter: Secondary | ICD-10-CM | POA: Insufficient documentation

## 2020-02-01 DIAGNOSIS — Z87891 Personal history of nicotine dependence: Secondary | ICD-10-CM | POA: Insufficient documentation

## 2020-02-01 NOTE — ED Provider Notes (Signed)
[facility] EMERGENCY DEPARTMENT Provider Note   CSN: [id number] Arrival date & time: 02/01/20  1149     History Chief Complaint  Patient presents with  . Arm Pain    Barbara Snow is a 54 y.o. female.  55 year old female presents with complaint of right arm pain after she slipped on a wet floor 2 days ago and fell, landing on her arm (not sure how she landed, fall happened so fast). Patient works as an Administrator, arts, lifting adults and states she is unable to do her job due to right arm soreness. No LOC, did not hit head, no other injuries, complaints, concerns.         Past Medical History:  Diagnosis Date  . COPD (chronic obstructive pulmonary disease) ([place])   . Depression   . Hyperlipidemia   . Hypertension   . Kidney stone   . Stroke [facility]) 10/2017    Patient Active Problem List   Diagnosis Date Noted  . Intracranial atherosclerosis 12/05/2017  . TIA (transient ischemic attack) 11/06/2017  . Hypertension 11/06/2017  . COPD (chronic obstructive pulmonary disease) ([place]) 11/06/2017    Past Surgical History:  Procedure Laterality Date  . ABDOMINAL HYSTERECTOMY    . CHOLECYSTECTOMY    . TUBAL LIGATION       OB History   No obstetric history on file.     Family History  Problem Relation Age of Onset  . Diabetes Mother   . Cancer Mother   . Heart attack Father   . Liver disease Father   . Hypertension Daughter   . Hypertension Daughter   . Diabetes Sister   . Hypertension Son     Social History   Tobacco Use  . Smoking status: Former Smoker    Packs/day: 0.00    Years: 21.00    Pack years: 0.00    Types: Cigarettes    Quit date: 11/06/2017    Years since quitting: 2.2  . Smokeless tobacco: Never Used  Vaping Use  . Vaping Use: Never used  Substance Use Topics  . Alcohol use: Yes    Comment: occ  . Drug use: No    Home Medications Prior to Admission medications   Medication Sig Start Date End Date Taking? Authorizing Provider  amLODipine  (NORVASC) 10 MG tablet Take 1 tablet (10 mg total) by mouth daily. 10/23/19   Soyla Dryer, PA-C  aspirin 325 MG tablet Take 325 mg by mouth daily.    [provider]  atorvastatin (LIPITOR) 40 MG tablet Take 1 tablet (40 mg total) by mouth at bedtime. 01/22/20   Soyla Dryer, PA-C  diphenhydrAMINE (BENADRYL) 25 MG tablet Take 25 mg by mouth every 6 (six) hours as needed.    [provider]  hydrochlorothiazide (HYDRODIURIL) 25 MG tablet Take 1 tablet (25 mg total) by mouth daily. 06/12/19   Soyla Dryer, PA-C  hydrOXYzine (ATARAX/VISTARIL) 25 MG tablet Take 25 mg by mouth 3 (three) times daily as needed (rest). Patient not taking: Reported on 01/22/2020    [provider]  lisinopril (ZESTRIL) 10 MG tablet Take 1 tablet by mouth once daily 01/04/20   Soyla Dryer, PA-C  naproxen sodium (ALEVE) 220 MG tablet Take 220 mg by mouth daily as needed.    [provider]  potassium chloride (KLOR-CON) 10 MEQ tablet Take 1 tablet by mouth once daily 01/04/20   Soyla Dryer, PA-C  promethazine (PHENERGAN) 25 MG tablet Take 1 tablet (25 mg total) by mouth every 8 (  eight) hours as needed for nausea or vomiting. Patient not taking: Reported on 12/28/2018 11/30/18   Soyla Dryer, PA-C    Allergies    Patient has no known allergies.  Review of Systems   Review of Systems  Constitutional: Negative for fever.  Eyes: Negative for visual disturbance.  Musculoskeletal: Positive for myalgias. Negative for arthralgias.  Skin: Negative for rash and wound.  Neurological: Negative for weakness, numbness and headaches.    Physical Exam Updated Vital Signs BP 138/76 (BP Location: Right Arm)   Pulse 76   Temp 98.2 F (36.8 C) (Oral)   Resp 18   Ht 5\' 6"  (1.676 m)   Wt 126.6 kg   SpO2 97%   BMI 45.03 kg/m   Physical Exam Vitals and nursing note reviewed.  Constitutional:      General: She is not in acute distress.    Appearance: She is well-developed.  She is not diaphoretic.  HENT:     Head: Normocephalic and atraumatic.  Cardiovascular:     Pulses: Normal pulses.  Pulmonary:     Effort: Pulmonary effort is normal.  Musculoskeletal:        General: Tenderness present. No swelling or deformity.       Arms:     Comments: Mild tenderness to right trapezius, right bicep and right forearm area. No bony tenderness over the wrist, elbow, shoulder, no crepitus. ROM limited to upper right extremity secondary to pain. Sensation intact, brisk cap refill present.   Skin:    General: Skin is warm and dry.     Findings: No erythema or rash.  Neurological:     Mental Status: She is alert and oriented to person, place, and time.  Psychiatric:        Behavior: Behavior normal.     ED Results / Procedures / Treatments   Labs (all labs ordered are listed, but only abnormal results are displayed) Labs Reviewed - No data to display  EKG None  Radiology DG Forearm Right  Result Date: 02/01/2020 CLINICAL DATA:  Fall, forearm pain EXAM: RIGHT FOREARM - 2 VIEW COMPARISON:  None. FINDINGS: There is no evidence of fracture or other focal bone lesions. Soft tissues are unremarkable. IMPRESSION: Negative. Electronically Signed   By: Rolm Baptise M.D.   On: 02/01/2020 12:48    Procedures Procedures (including critical care time)  Medications Ordered in ED Medications - No data to display  ED Course  I have reviewed the triage vital signs and the nursing notes.  Pertinent labs & imaging results that were available during my care of the patient were reviewed by me and considered in my medical decision making (see chart for details).  Clinical Course as of Jan 31 1345  Thu Feb 01, 7732  6328 54 year old female with right arm pain after a fall 2 days ago. On exam, seems to have muscle soreness to right forearm, bicep, trapezius. XR R forearm negative. Plan is to place in velcro wrist splint and right the arm, given lifting restrictions. Advised to  recheck with PCP.   [LM]    Clinical Course User Index [LM] Roque Lias   MDM Rules/Calculators/A&P                          Final Clinical Impression(s) / ED Diagnoses Final diagnoses:  Arm pain, right  Muscle strain    Rx / DC Orders ED Discharge Orders    None  Tacy Learn, PA-C 02/01/20 [place], Ankit, MD 02/02/20 1145

## 2020-02-01 NOTE — Discharge Instructions (Signed)
Apply warm compresses to sore muscles for 20 minutes at a time. Apply Voltaren gel (OTC) to area as directed. Take Tylenol as needed for pain. Wear splint for comfort, limit lifting to 5 pounds with your right arm. Follow up with your doctor for recheck.

## 2020-02-01 NOTE — ED Triage Notes (Signed)
Pt c/o RT arm pain x 2 days after she fell at her house. 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 No obvious deformity noted.

## 2020-02-22 ENCOUNTER — Ambulatory Visit: Payer: Self-pay | Admitting: Physician Assistant

## 2020-02-23 ENCOUNTER — Other Ambulatory Visit: Payer: Self-pay | Admitting: Physician Assistant

## 2020-02-29 ENCOUNTER — Ambulatory Visit: Payer: Self-pay | Admitting: Physician Assistant

## 2020-03-07 ENCOUNTER — Ambulatory Visit: Payer: Self-pay | Admitting: Physician Assistant

## 2020-03-07 DIAGNOSIS — I10 Essential (primary) hypertension: Secondary | ICD-10-CM

## 2020-03-07 DIAGNOSIS — Z91199 Patient's noncompliance with other medical treatment and regimen due to unspecified reason: Secondary | ICD-10-CM

## 2020-03-07 DIAGNOSIS — E785 Hyperlipidemia, unspecified: Secondary | ICD-10-CM

## 2020-03-07 NOTE — Progress Notes (Signed)
There were no vitals taken for this visit.   Subjective:    Patient ID: Barbara Snow, female    DOB: 12-03-1965, 54 y.o.   MRN: [id number]  HPI: Barbara Snow is a 54 y.o. female presenting on 03/07/2020 for No chief complaint on file.   HPI   This is a telemedicine appointment due to coronavirus pandemic.   It is via telephone as pt does not have a video enabled device.  I connected with  Barbara Snow on 03/07/2020 by a video enabled telemedicine application and verified that I am speaking with the correct person using two identifiers.   I discussed the limitations of evaluation and management by telemedicine. The patient expressed understanding and agreed to proceed.    Pt is at work.  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      Pt appointment today to follow up HTN and dyslipidemia.  She has not recently taking her BP and she has not gotten labs drawn (that were due to be done in July).  She is at work and there is a lot of noise in the background.      Relevant past medical, surgical, family and social history reviewed and updated as indicated. Interim medical history since our last visit reviewed. Allergies and medications reviewed and updated.    Current Outpatient Medications:    amLODipine (NORVASC) 10 MG tablet, Take 1 tablet (10 mg total) by mouth daily., Disp: 30 tablet, Rfl: 3   aspirin 325 MG tablet, Take 325 mg by mouth daily., Disp: , Rfl:    atorvastatin (LIPITOR) 40 MG tablet, Take 1 tablet (40 mg total) by mouth at bedtime., Disp: 30 tablet, Rfl: 2   hydrochlorothiazide (HYDRODIURIL) 25 MG tablet, Take 1 tablet (25 mg total) by mouth daily., Disp: 30 tablet, Rfl: 1   hydrOXYzine (ATARAX/VISTARIL) 25 MG tablet, Take 25 mg by mouth 3 (three) times daily as needed (rest). , Disp: , Rfl:    lisinopril (ZESTRIL) 10 MG tablet, Take 1 tablet by mouth once daily, Disp: 30 tablet, Rfl: 0   naproxen sodium (ALEVE) 220 MG tablet, Take 220 mg by mouth daily as needed., Disp: ,  Rfl:    potassium chloride (KLOR-CON) 10 MEQ tablet, Take 1 tablet by mouth once daily, Disp: 30 tablet, Rfl: 0   promethazine (PHENERGAN) 25 MG tablet, Take 1 tablet (25 mg total) by mouth every 8 (eight) hours as needed for nausea or vomiting., Disp: 10 tablet, Rfl: 0   diphenhydrAMINE (BENADRYL) 25 MG tablet, Take 25 mg by mouth every 6 (six) hours as needed. (Patient not taking: Reported on 03/07/2020), Disp: , Rfl:     Review of Systems  Per HPI unless specifically indicated above     Objective:    There were no vitals taken for this visit.  Wt Readings from Last 3 Encounters:  02/01/20 279 lb (126.6 kg)  01/22/20 (!) 281 lb 8 oz (127.7 kg)  07/28/18 276 lb (125.2 kg)    Physical Exam Pulmonary:     Effort: No respiratory distress.  Neurological:     Mental Status: She is alert and oriented to person, place, and time.            Assessment & Plan:    Encounter Diagnoses  Name Primary?   Noncompliance Yes   Essential hypertension    Hyperlipidemia, unspecified hyperlipidemia type      Pt says she will get labs drawn.  Appointment made for her in office on  03/14/20. ° ° °

## 2020-03-14 ENCOUNTER — Other Ambulatory Visit: Payer: Self-pay

## 2020-03-14 ENCOUNTER — Ambulatory Visit: Payer: Self-pay | Admitting: Physician Assistant

## 2020-03-14 ENCOUNTER — Other Ambulatory Visit (HOSPITAL_COMMUNITY)
Admission: RE | Admit: 2020-03-14 | Discharge: 2020-03-14 | Disposition: A | Discharge status: Home / Self Care | Payer: Self-pay | Source: Ambulatory Visit | Attending: Physician Assistant | Admitting: Physician Assistant

## 2020-03-14 DIAGNOSIS — I10 Essential (primary) hypertension: Secondary | ICD-10-CM | POA: Insufficient documentation

## 2020-03-14 DIAGNOSIS — E785 Hyperlipidemia, unspecified: Secondary | ICD-10-CM | POA: Insufficient documentation

## 2020-03-14 LAB — COMPREHENSIVE METABOLIC PANEL
ALT: 26 U/L (ref 0–44)
AST: 20 U/L (ref 15–41)
Albumin: 4 g/dL (ref 3.5–5.0)
Alkaline Phosphatase: 78 U/L (ref 38–126)
Anion gap: 10 (ref 5–15)
BUN: 18 mg/dL (ref 6–20)
CO2: 26 mmol/L (ref 22–32)
Calcium: 9.7 mg/dL (ref 8.9–10.3)
Chloride: 98 mmol/L (ref 98–111)
Creatinine, Ser: 0.71 mg/dL (ref 0.44–1.00)
GFR calc Af Amer: >60 mL/min (ref >60)
GFR calc non Af Amer: >60 mL/min (ref >60)
Glucose, Bld: 106 mg/dL — ABNORMAL HIGH (ref 70–99)
Potassium: 3.2 mmol/L — ABNORMAL LOW (ref 3.5–5.1)
Sodium: 134 mmol/L — ABNORMAL LOW (ref 135–145)
Total Bilirubin: 1.1 mg/dL (ref 0.3–1.2)
Total Protein: 8.1 g/dL (ref 6.5–8.1)

## 2020-03-14 LAB — LIPID PANEL
Cholesterol: 137 mg/dL (ref 0–200)
HDL: 54 mg/dL (ref >40)
LDL Cholesterol: 60 mg/dL (ref 0–99)
Total CHOL/HDL Ratio: 2.5 RATIO
Triglycerides: 114 mg/dL (ref <150)
VLDL: 23 mg/dL (ref 0–40)

## 2020-04-03 ENCOUNTER — Other Ambulatory Visit: Payer: Self-pay | Admitting: Physician Assistant

## 2020-04-15 ENCOUNTER — Encounter: Payer: Self-pay | Admitting: Physician Assistant

## 2020-04-15 ENCOUNTER — Other Ambulatory Visit: Payer: Self-pay

## 2020-04-15 ENCOUNTER — Ambulatory Visit: Payer: Self-pay | Admitting: Physician Assistant

## 2020-04-15 VITALS — BP 150/86 | HR 77 | Temp 98.0°F

## 2020-04-15 DIAGNOSIS — I10 Essential (primary) hypertension: Secondary | ICD-10-CM

## 2020-04-15 DIAGNOSIS — E876 Hypokalemia: Secondary | ICD-10-CM

## 2020-04-15 DIAGNOSIS — E785 Hyperlipidemia, unspecified: Secondary | ICD-10-CM

## 2020-04-15 MED ORDER — LISINOPRIL 20 MG PO TABS: 20.0000 mg | ORAL_TABLET | Freq: Every day | ORAL | 3 refills | Status: DC

## 2020-04-15 NOTE — Progress Notes (Signed)
BP (!) 150/86   Pulse 77   Temp 98 F (36.7 C)   SpO2 96%    Subjective:    Patient ID: Barbara Snow, female    DOB: 1966/02/19, 54 y.o.   MRN: [id number]  HPI: RENNY GUNNARSON is a 54 y.o. female presenting on 04/15/2020 for No chief complaint on file.   HPI   Pt had a negative covid 19 screening questionnaire.    Pt is 18yoF with routine follow up HTN and dyslipidemia.  Pt is still working at [employer].  She says she is doing well and she has no complaints.       Relevant past medical, surgical, family and social history reviewed and updated as indicated. Interim medical history since our last visit reviewed. Allergies and medications reviewed and updated.   Current Outpatient Medications:  .  amLODipine (NORVASC) 10 MG tablet, Take 1 tablet (10 mg total) by mouth daily., Disp: 30 tablet, Rfl: 3 .  aspirin 325 MG tablet, Take 325 mg by mouth daily., Disp: , Rfl:  .  atorvastatin (LIPITOR) 40 MG tablet, Take 1 tablet (40 mg total) by mouth at bedtime., Disp: 30 tablet, Rfl: 2 .  diphenhydrAMINE (BENADRYL) 25 MG tablet, Take 25 mg by mouth every 6 (six) hours as needed. , Disp: , Rfl:  .  hydrochlorothiazide (HYDRODIURIL) 25 MG tablet, Take 1 tablet (25 mg total) by mouth daily., Disp: 30 tablet, Rfl: 1 .  lisinopril (ZESTRIL) 10 MG tablet, Take 1 tablet by mouth once daily, Disp: 30 tablet, Rfl: 0 .  naproxen sodium (ALEVE) 220 MG tablet, Take 220 mg by mouth daily as needed., Disp: , Rfl:  .  potassium chloride (KLOR-CON) 10 MEQ tablet, Take 1 tablet by mouth once daily, Disp: 30 tablet, Rfl: 0 .  promethazine (PHENERGAN) 25 MG tablet, Take 1 tablet (25 mg total) by mouth every 8 (eight) hours as needed for nausea or vomiting., Disp: 10 tablet, Rfl: 0    Review of Systems  Per HPI unless specifically indicated above     Objective:    BP (!) 150/86   Pulse 77   Temp 98 F (36.7 C)   SpO2 96%   Wt Readings from Last 3 Encounters:  02/01/20 279 lb  (126.6 kg)  01/22/20 (!) 281 lb 8 oz (127.7 kg)  07/28/18 276 lb (125.2 kg)    Physical Exam Vitals reviewed.  Constitutional:      General: She is not in acute distress.    Appearance: She is well-developed. She is not ill-appearing.  HENT:     Head: Normocephalic and atraumatic.  Cardiovascular:     Rate and Rhythm: Normal rate and regular rhythm.  Pulmonary:     Effort: Pulmonary effort is normal.     Breath sounds: Normal breath sounds.  Abdominal:     General: Bowel sounds are normal.     Palpations: Abdomen is soft. There is no mass.     Tenderness: There is no abdominal tenderness.  Musculoskeletal:     Cervical back: Neck supple.     Right lower leg: No edema.     Left lower leg: No edema.  Lymphadenopathy:     Cervical: No cervical adenopathy.  Skin:    General: Skin is warm and dry.  Neurological:     Mental Status: She is alert and oriented to person, place, and time.  Psychiatric:        Attention and Perception: Attention normal.  Speech: Speech normal.        Behavior: Behavior normal. Behavior is cooperative.     Results for orders placed or performed during the hospital encounter of 03/14/20  Lipid panel  Result Value Ref Range   Cholesterol 137 0 - 200 mg/dL   Triglycerides 114 <150 mg/dL   HDL 54 >40 mg/dL   Total CHOL/HDL Ratio 2.5 RATIO   VLDL 23 0 - 40 mg/dL   LDL Cholesterol 60 0 - 99 mg/dL  Comprehensive metabolic panel  Result Value Ref Range   Sodium 134 (L) 135 - 145 mmol/L   Potassium 3.2 (L) 3.5 - 5.1 mmol/L   Chloride 98 98 - 111 mmol/L   CO2 26 22 - 32 mmol/L   Glucose, Bld 106 (H) 70 - 99 mg/dL   BUN 18 6 - 20 mg/dL   Creatinine, Ser 0.71 0.44 - 1.00 mg/dL   Calcium 9.7 8.9 - 10.3 mg/dL   Total Protein 8.1 6.5 - 8.1 g/dL   Albumin 4.0 3.5 - 5.0 g/dL   AST 20 15 - 41 U/L   ALT 26 0 - 44 U/L   Alkaline Phosphatase 78 38 - 126 U/L   Total Bilirubin 1.1 0.3 - 1.2 mg/dL   GFR calc non Af Amer >60 >60 mL/min   GFR calc Af  Amer >60 >60 mL/min   Anion gap 10 5 - 15      Assessment & Plan:    Encounter Diagnoses  Name Primary?  . Essential hypertension Yes  . Hyperlipidemia, unspecified hyperlipidemia type   . Hypokalemia   . Morbid obesity ([place])     -pt to Take potassium bid x 1 week then return to qd. -Reviewed labs with pt  -Increase lisinopril to 20 for htn -pt to continue her other medications as well -Reminded pt to return iFOBT for colon cancer screening -pt to follow up in 3 months.  She is to contact office sooner prn

## 2020-04-15 NOTE — Patient Instructions (Signed)
Increase potassium pills to twice daily for one week and then return to taking it once/daily

## 2020-04-17 ENCOUNTER — Telehealth: Payer: Self-pay | Admitting: Physician Assistant

## 2020-04-17 NOTE — Telephone Encounter (Signed)
Pt called stating she needed a note to RTW because she had a HA and felt weak and tired yesterday while at work.  She says she was not allowed to leave work at the time and continued the rest of her shift.  She says she has been working too much.  She has not taken any vacation time this year.  She says she had 132 hours on her last payperiod (for a 2 wk time).  She admits that she is not sleeping enough because she is working too much.  She says it is creating a lot of stress.  She is not having any symptoms today except feeling stress.  She is not having any HA, vision changes, problems walking or talking.  She was seen in the office earlier this week.  Discussed with pt that it seems she needs a few days to rest and she agrees.  She says she would like to RTW on Monday.  Note to this effect will be emailed to her at her new email:  [email or web address]   Pt is told that she will need to call office and come in to be seen if she is not feeling up to returning on Monday.  She is told to call office prior to that time if she starts feeling bad or having any symptoms.  She states understanding and agrees.

## 2020-05-04 ENCOUNTER — Other Ambulatory Visit: Payer: Self-pay | Admitting: Physician Assistant

## 2020-05-07 ENCOUNTER — Other Ambulatory Visit (HOSPITAL_COMMUNITY): Payer: Self-pay | Admitting: Physician Assistant

## 2020-05-07 DIAGNOSIS — Z09 Encounter for follow-up examination after completed treatment for conditions other than malignant neoplasm: Secondary | ICD-10-CM

## 2020-05-11 ENCOUNTER — Other Ambulatory Visit: Payer: Self-pay | Admitting: Physician Assistant

## 2020-05-13 ENCOUNTER — Telehealth: Payer: Self-pay | Admitting: Physician Assistant

## 2020-05-13 NOTE — Telephone Encounter (Signed)
Returning VM from pt.  She says her employer wants her to get covid booster shot tomorrow and she has appt for diagnostic mammogram December 14.  Discussed with pt that radiology wants her to wait 6-8 wk after vaccination prior to mammogram.  Discussed with pt that she will have to make decision herself if she would rather postpone her booster or her mammogram.  She stated acknowledgement and understanding.

## 2020-05-14 ENCOUNTER — Other Ambulatory Visit: Payer: Self-pay | Admitting: Physician Assistant

## 2020-05-14 ENCOUNTER — Telehealth: Payer: Self-pay | Admitting: Student

## 2020-05-14 MED ORDER — DIPHENOXYLATE-ATROPINE 2.5-0.025 MG PO TABS: 2.0000 | ORAL_TABLET | Freq: Four times a day (QID) | ORAL | 0 refills | Status: DC | PRN

## 2020-05-14 NOTE — Telephone Encounter (Signed)
Pt called c/o diarrhea which started last night at 2am and pt states she "messed up the bed". Pt states she has taken 2 doses of imodium which have not helped. Pt reports having had 4 episodes today and it "runs like a faucet".  PA rx lomotil to pt's preferred pharmacy. Pt is to drink plenty of fluids to stay hydrated and is to call the office tomorrow if no improvements. Pt verbalized understanding and requests rx to be sent to [facility] in [place].

## 2020-06-03 ENCOUNTER — Other Ambulatory Visit: Payer: Self-pay | Admitting: Physician Assistant

## 2020-06-11 ENCOUNTER — Ambulatory Visit (HOSPITAL_COMMUNITY)
Admission: RE | Admit: 2020-06-11 | Discharge: 2020-06-11 | Disposition: A | Discharge status: Home / Self Care | Payer: Self-pay | Source: Ambulatory Visit | Attending: Physician Assistant | Admitting: Physician Assistant

## 2020-06-11 ENCOUNTER — Other Ambulatory Visit: Payer: Self-pay

## 2020-06-11 DIAGNOSIS — Z09 Encounter for follow-up examination after completed treatment for conditions other than malignant neoplasm: Secondary | ICD-10-CM | POA: Insufficient documentation

## 2020-07-01 ENCOUNTER — Other Ambulatory Visit: Payer: Self-pay | Admitting: Physician Assistant

## 2020-07-01 DIAGNOSIS — E785 Hyperlipidemia, unspecified: Secondary | ICD-10-CM

## 2020-07-01 DIAGNOSIS — Z8639 Personal history of other endocrine, nutritional and metabolic disease: Secondary | ICD-10-CM

## 2020-07-01 DIAGNOSIS — I10 Essential (primary) hypertension: Secondary | ICD-10-CM

## 2020-07-17 ENCOUNTER — Ambulatory Visit: Payer: Self-pay | Admitting: Physician Assistant

## 2020-08-12 ENCOUNTER — Other Ambulatory Visit: Payer: Self-pay | Admitting: Physician Assistant

## 2020-08-14 ENCOUNTER — Ambulatory Visit: Payer: Self-pay | Admitting: Physician Assistant

## 2020-08-20 ENCOUNTER — Other Ambulatory Visit (HOSPITAL_COMMUNITY)
Admission: RE | Admit: 2020-08-20 | Discharge: 2020-08-20 | Disposition: A | Discharge status: Home / Self Care | Payer: Self-pay | Source: Ambulatory Visit | Attending: Physician Assistant | Admitting: Physician Assistant

## 2020-08-20 ENCOUNTER — Other Ambulatory Visit: Payer: Self-pay

## 2020-08-20 DIAGNOSIS — I10 Essential (primary) hypertension: Secondary | ICD-10-CM

## 2020-08-20 DIAGNOSIS — E785 Hyperlipidemia, unspecified: Secondary | ICD-10-CM

## 2020-08-20 DIAGNOSIS — Z8639 Personal history of other endocrine, nutritional and metabolic disease: Secondary | ICD-10-CM

## 2020-08-20 LAB — COMPREHENSIVE METABOLIC PANEL
ALT: 23 U/L (ref 0–44)
AST: 20 U/L (ref 15–41)
Albumin: 4 g/dL (ref 3.5–5.0)
Alkaline Phosphatase: 89 U/L (ref 38–126)
Anion gap: 6 (ref 5–15)
BUN: 15 mg/dL (ref 6–20)
CO2: 29 mmol/L (ref 22–32)
Calcium: 9.6 mg/dL (ref 8.9–10.3)
Chloride: 101 mmol/L (ref 98–111)
Creatinine, Ser: 0.77 mg/dL (ref 0.44–1.00)
GFR, Estimated: >60 mL/min (ref >60)
Glucose, Bld: 103 mg/dL — ABNORMAL HIGH (ref 70–99)
Potassium: 3.7 mmol/L (ref 3.5–5.1)
Sodium: 136 mmol/L (ref 135–145)
Total Bilirubin: 0.7 mg/dL (ref 0.3–1.2)
Total Protein: 8.3 g/dL — ABNORMAL HIGH (ref 6.5–8.1)

## 2020-08-20 LAB — LIPID PANEL
Cholesterol: 132 mg/dL (ref 0–200)
HDL: 47 mg/dL (ref >40)
LDL Cholesterol: 53 mg/dL (ref 0–99)
Total CHOL/HDL Ratio: 2.8 RATIO
Triglycerides: 160 mg/dL — ABNORMAL HIGH (ref <150)
VLDL: 32 mg/dL (ref 0–40)

## 2020-08-20 LAB — TSH: TSH: 7.634 u[IU]/mL — ABNORMAL HIGH (ref 0.350–4.500)

## 2020-08-22 ENCOUNTER — Ambulatory Visit: Payer: Self-pay | Admitting: Physician Assistant

## 2020-08-27 ENCOUNTER — Ambulatory Visit: Payer: Self-pay | Admitting: Physician Assistant

## 2020-08-27 ENCOUNTER — Other Ambulatory Visit: Payer: Self-pay

## 2020-08-27 ENCOUNTER — Encounter: Payer: Self-pay | Admitting: Physician Assistant

## 2020-08-27 VITALS — BP 124/79 | HR 76 | Temp 97.9°F | Wt 286.0 lb

## 2020-08-27 DIAGNOSIS — R1310 Dysphagia, unspecified: Secondary | ICD-10-CM

## 2020-08-27 DIAGNOSIS — E039 Hypothyroidism, unspecified: Secondary | ICD-10-CM

## 2020-08-27 DIAGNOSIS — E785 Hyperlipidemia, unspecified: Secondary | ICD-10-CM

## 2020-08-27 DIAGNOSIS — I10 Essential (primary) hypertension: Secondary | ICD-10-CM

## 2020-08-27 MED ORDER — PANTOPRAZOLE SODIUM 40 MG PO TBEC: 40.0000 mg | DELAYED_RELEASE_TABLET | Freq: Two times a day (BID) | ORAL | 1 refills | Status: DC

## 2020-08-27 MED ORDER — LEVOTHYROXINE SODIUM 25 MCG PO TABS: 25.0000 ug | ORAL_TABLET | Freq: Every day | ORAL | 4 refills | Status: DC

## 2020-08-27 NOTE — Progress Notes (Signed)
There were no vitals taken for this visit.   Subjective:    Patient ID: Barbara Snow, female    DOB: 1966-05-21, 55 y.o.   MRN: [id number]  HPI: Barbara Snow is a 55 y.o. female presenting on 08/27/2020 for No chief complaint on file.   HPI  Pt had a negative covid 19 screening questionnaire.    Pt is 55yoF with routine follow up for HTN and dyslipidemia.  She Feels like food gets stuck in throat since her cva.  She thinks it is getting worse.  She notices it most often with meat.  She feels a bit sluggish at times  She is still working [employer].    Pt had covid in January which she thinks she got while at work.   She says she is better from that now.  Pt denies any chest pains or sob.      Relevant past medical, surgical, family and social history reviewed and updated as indicated. Interim medical history since our last visit reviewed. Allergies and medications reviewed and updated.   Current Outpatient Medications:  .  amLODipine (NORVASC) 10 MG tablet, Take 1 tablet (10 mg total) by mouth daily., Disp: 30 tablet, Rfl: 3 .  aspirin 325 MG tablet, Take 325 mg by mouth daily., Disp: , Rfl:  .  atorvastatin (LIPITOR) 40 MG tablet, TAKE 1 TABLET BY MOUTH AT BEDTIME, Disp: 30 tablet, Rfl: 3 .  diphenhydrAMINE (BENADRYL) 25 MG tablet, Take 25 mg by mouth every 6 (six) hours as needed. , Disp: , Rfl:  .  hydrochlorothiazide (HYDRODIURIL) 25 MG tablet, Take 1 tablet by mouth once daily, Disp: 30 tablet, Rfl: 1 .  lisinopril (ZESTRIL) 20 MG tablet, Take 1 tablet (20 mg total) by mouth daily., Disp: 30 tablet, Rfl: 3 .  Potassium 99 MG TABS, Take by mouth., Disp: , Rfl:  .  potassium chloride (KLOR-CON) 10 MEQ tablet, Take 1 tablet by mouth once daily, Disp: 30 tablet, Rfl: 0     Review of Systems  Per HPI unless specifically indicated above     Objective:    There were no vitals taken for this visit.  Wt Readings from Last 3 Encounters:  02/01/20 279 lb  (126.6 kg)  01/22/20 (!) 281 lb 8 oz (127.7 kg)  07/28/18 276 lb (125.2 kg)    Vitals:   08/27/20 1333  BP: 124/79  Pulse: 76  Temp: 97.9 F (36.6 C)  SpO2: 99%     Physical Exam Vitals reviewed.  Constitutional:      General: She is not in acute distress.    Appearance: She is well-developed and well-nourished. She is obese. She is not ill-appearing.  HENT:     Head: Normocephalic and atraumatic.  Cardiovascular:     Rate and Rhythm: Normal rate and regular rhythm.  Pulmonary:     Effort: Pulmonary effort is normal.     Breath sounds: Normal breath sounds.  Abdominal:     General: Bowel sounds are normal.     Palpations: Abdomen is soft. There is no hepatosplenomegaly or mass.     Tenderness: There is no abdominal tenderness. There is no guarding or rebound.  Musculoskeletal:        General: No edema.     Cervical back: Neck supple.     Right lower leg: No edema.     Left lower leg: No edema.  Lymphadenopathy:     Cervical: No cervical adenopathy.  Skin:  General: Skin is warm and dry.  Neurological:     Mental Status: She is alert and oriented to person, place, and time.  Psychiatric:        Mood and Affect: Mood and affect normal.        Behavior: Behavior normal.     Results for orders placed or performed during the hospital encounter of 08/20/20  TSH  Result Value Ref Range   TSH 7.634 (H) 0.350 - 4.500 uIU/mL  Lipid panel  Result Value Ref Range   Cholesterol 132 0 - 200 mg/dL   Triglycerides 160 (H) <150 mg/dL   HDL 47 >40 mg/dL   Total CHOL/HDL Ratio 2.8 RATIO   VLDL 32 0 - 40 mg/dL   LDL Cholesterol 53 0 - 99 mg/dL  Comprehensive metabolic panel  Result Value Ref Range   Sodium 136 135 - 145 mmol/L   Potassium 3.7 3.5 - 5.1 mmol/L   Chloride 101 98 - 111 mmol/L   CO2 29 22 - 32 mmol/L   Glucose, Bld 103 (H) 70 - 99 mg/dL   BUN 15 6 - 20 mg/dL   Creatinine, Ser 0.77 0.44 - 1.00 mg/dL   Calcium 9.6 8.9 - 10.3 mg/dL   Total Protein 8.3 (H)  6.5 - 8.1 g/dL   Albumin 4.0 3.5 - 5.0 g/dL   AST 20 15 - 41 U/L   ALT 23 0 - 44 U/L   Alkaline Phosphatase 89 38 - 126 U/L   Total Bilirubin 0.7 0.3 - 1.2 mg/dL   GFR, Estimated >60 >60 mL/min   Anion gap 6 5 - 15      Assessment & Plan:     Encounter Diagnoses  Name Primary?  . Essential hypertension Yes  . Hyperlipidemia, unspecified hyperlipidemia type   . Hypothyroidism, unspecified type   . Dysphagia, unspecified type   . Morbid obesity ([place])      -reviewed labs with pt  -Start low dose levothyroxine -pt to continue current medications -Discussed otc K+ is not same as Rx K+.  Her K+ is stable on her Rx.  She will stop the OTC K+ -encouraged pt to get covid booster -mammogrm- not due until may -Pt encouraged to return ifobt given for colon cancer screening.  She says she still has it at home -Update pap at next OV -rx ppi for dysphagia, likely caused by esophageal irritation or some gastrits.  Will refer if not improved at next appt.  Pt is counseled to use caution with eating:  Take small bites, chew thoroughly, use extra care with meats and bread.  -pt to Follow up 1 month.  She is to contact office sooner prn

## 2020-09-03 ENCOUNTER — Other Ambulatory Visit: Payer: Self-pay | Admitting: Physician Assistant

## 2020-09-18 ENCOUNTER — Other Ambulatory Visit: Payer: Self-pay | Admitting: Physician Assistant

## 2020-10-01 ENCOUNTER — Ambulatory Visit: Payer: Self-pay | Admitting: Physician Assistant

## 2020-10-07 ENCOUNTER — Other Ambulatory Visit: Payer: Self-pay | Admitting: Physician Assistant

## 2020-10-15 ENCOUNTER — Ambulatory Visit: Payer: Self-pay | Admitting: Physician Assistant

## 2020-11-17 ENCOUNTER — Other Ambulatory Visit: Payer: Self-pay | Admitting: Physician Assistant

## 2020-11-28 ENCOUNTER — Encounter: Payer: Self-pay | Admitting: Physician Assistant

## 2020-11-28 ENCOUNTER — Ambulatory Visit: Payer: Self-pay | Admitting: Physician Assistant

## 2020-11-28 VITALS — BP 168/94 | HR 71 | Temp 97.8°F | Wt 297.0 lb

## 2020-11-28 DIAGNOSIS — R1310 Dysphagia, unspecified: Secondary | ICD-10-CM

## 2020-11-28 DIAGNOSIS — Z9119 Patient's noncompliance with other medical treatment and regimen: Secondary | ICD-10-CM

## 2020-11-28 DIAGNOSIS — Z91199 Patient's noncompliance with other medical treatment and regimen due to unspecified reason: Secondary | ICD-10-CM

## 2020-11-28 DIAGNOSIS — I10 Essential (primary) hypertension: Secondary | ICD-10-CM

## 2020-11-28 MED ORDER — HYDROCHLOROTHIAZIDE 25 MG PO TABS: 1.0000 | ORAL_TABLET | Freq: Every day | ORAL | 1 refills | Status: DC

## 2020-11-28 MED ORDER — PANTOPRAZOLE SODIUM 40 MG PO TBEC: 40.0000 mg | DELAYED_RELEASE_TABLET | Freq: Two times a day (BID) | ORAL | 1 refills | Status: DC

## 2020-11-28 MED ORDER — AMLODIPINE BESYLATE 10 MG PO TABS: 1.0000 | ORAL_TABLET | Freq: Every day | ORAL | 1 refills | Status: DC

## 2020-11-28 NOTE — Progress Notes (Signed)
BP (!) 168/94   Pulse 71   Temp 97.8 F (36.6 C)   Wt 297 lb (134.7 kg)   SpO2 94%   BMI 47.94 kg/m    Subjective:    Patient ID: Barbara Snow, female    DOB: 1965-12-20, 55 y.o.   MRN: [id number]  HPI: Barbara Snow is a 55 y.o. female presenting on 11/28/2020 for No chief complaint on file.   HPI  Pt had a negative covid 19 screening questionnaire.    Pt is scheduled for follow up dysphagia HTN and PAP.     She never got her protonix so she is still having dysphagia.  She is out of 2 of her htn medications so her bp is elevated today.  Her appointment was rescheduled twice so her PAP can't be updated today because it's late on a Thursday (courier issues with pap samples necessitate PAPs being done Monday-Wednesday)  Pt has still never returned her FIT test.  Pt reports bruising recently without known injury.  She doesn't have any now but says one just went away on her thigh.   She is still working at [employer] and works many hours, sometimes around 60 hours/week.    Relevant past medical, surgical, family and social history reviewed and updated as indicated. Interim medical history since our last visit reviewed. Allergies and medications reviewed and updated.   Current Outpatient Medications:  .  aspirin EC 81 MG tablet, Take 81 mg by mouth daily. Swallow whole., Disp: , Rfl:  .  atorvastatin (LIPITOR) 40 MG tablet, TAKE 1 TABLET BY MOUTH AT BEDTIME, Disp: 30 tablet, Rfl: 3 .  diphenhydrAMINE (BENADRYL) 25 MG tablet, Take 25 mg by mouth every 6 (six) hours as needed. , Disp: , Rfl:  .  diphenhydramine-acetaminophen (TYLENOL PM) 25-500 MG TABS tablet, Take 1 tablet by mouth at bedtime as needed., Disp: , Rfl:  .  levothyroxine (SYNTHROID) 25 MCG tablet, Take 1 tablet (25 mcg total) by mouth daily before breakfast., Disp: 30 tablet, Rfl: 4 .  lisinopril (ZESTRIL) 20 MG tablet, Take 1 tablet by mouth once daily, Disp: 30 tablet, Rfl: 2 .  potassium chloride  (KLOR-CON) 10 MEQ tablet, Take 1 tablet by mouth once daily, Disp: 30 tablet, Rfl: 2 .  amLODipine (NORVASC) 10 MG tablet, Take 1 tablet by mouth once daily (Patient not taking: Reported on 11/28/2020), Disp: 30 tablet, Rfl: 1 .  hydrochlorothiazide (HYDRODIURIL) 25 MG tablet, Take 1 tablet by mouth once daily (Patient not taking: Reported on 11/28/2020), Disp: 30 tablet, Rfl: 1 .  pantoprazole (PROTONIX) 40 MG tablet, Take 1 tablet (40 mg total) by mouth 2 (two) times daily. (Patient not taking: Reported on 11/28/2020), Disp: 60 tablet, Rfl: 1    Review of Systems  Per HPI unless specifically indicated above     Objective:    BP (!) 168/94   Pulse 71   Temp 97.8 F (36.6 C)   Wt 297 lb (134.7 kg)   SpO2 94%   BMI 47.94 kg/m   Wt Readings from Last 3 Encounters:  11/28/20 297 lb (134.7 kg)  08/27/20 286 lb (129.7 kg)  02/01/20 279 lb (126.6 kg)    Physical Exam Vitals reviewed.  Constitutional:      General: She is not in acute distress.    Appearance: She is well-developed. She is obese. She is not toxic-appearing.  HENT:     Head: Normocephalic and atraumatic.  Cardiovascular:     Rate  and Rhythm: Normal rate and regular rhythm.  Pulmonary:     Effort: Pulmonary effort is normal.     Breath sounds: Normal breath sounds.  Abdominal:     General: Bowel sounds are normal.     Palpations: Abdomen is soft. There is no mass.     Tenderness: There is no abdominal tenderness.  Musculoskeletal:     Cervical back: Neck supple.  Lymphadenopathy:     Cervical: No cervical adenopathy.  Skin:    General: Skin is warm and dry.  Neurological:     Mental Status: She is alert and oriented to person, place, and time.  Psychiatric:        Behavior: Behavior normal.              Assessment & Plan:    Encounter Diagnoses  Name Primary?  . Personal history of noncompliance with medical treatment, presenting hazards to health Yes  . Dysphagia, unspecified type   . Essential  hypertension   . Morbid obesity ([place])      -refills given and pt is counseled to avoid running out of her medications -protonix bid for her dysphagia -amlodipine, hctz, lisinopril for HTN -atorvastain for lipids -Will check cbc with next labs next month due to bruising -discussed recent weight gain with pt- 12 pounds over past 3 months- and discussed how this further complicates htn, dysphagia, multiple health issues.  Encouraged healthy eating and regular exercise -pt to follow up 1 month.  She is to contact office sooner prn

## 2020-12-31 ENCOUNTER — Ambulatory Visit: Payer: Self-pay | Admitting: Physician Assistant

## 2021-01-09 ENCOUNTER — Emergency Department (HOSPITAL_COMMUNITY)
Admission: EM | Admit: 2021-01-09 | Discharge: 2021-01-09 | Disposition: A | Discharge status: Home / Self Care | Payer: Self-pay | Attending: Emergency Medicine | Admitting: Emergency Medicine

## 2021-01-09 ENCOUNTER — Encounter (HOSPITAL_COMMUNITY): Payer: Self-pay

## 2021-01-09 ENCOUNTER — Other Ambulatory Visit: Payer: Self-pay

## 2021-01-09 DIAGNOSIS — J449 Chronic obstructive pulmonary disease, unspecified: Secondary | ICD-10-CM | POA: Insufficient documentation

## 2021-01-09 DIAGNOSIS — Z79899 Other long term (current) drug therapy: Secondary | ICD-10-CM | POA: Insufficient documentation

## 2021-01-09 DIAGNOSIS — Z87891 Personal history of nicotine dependence: Secondary | ICD-10-CM | POA: Insufficient documentation

## 2021-01-09 DIAGNOSIS — Z7982 Long term (current) use of aspirin: Secondary | ICD-10-CM | POA: Insufficient documentation

## 2021-01-09 DIAGNOSIS — I10 Essential (primary) hypertension: Secondary | ICD-10-CM | POA: Insufficient documentation

## 2021-01-09 DIAGNOSIS — K0889 Other specified disorders of teeth and supporting structures: Secondary | ICD-10-CM | POA: Insufficient documentation

## 2021-01-09 MED ORDER — LIDOCAINE VISCOUS HCL 2 % MT SOLN: 15.0000 mL | OROMUCOSAL | 2 refills | Status: DC | PRN

## 2021-01-09 MED ORDER — PENICILLIN V POTASSIUM 500 MG PO TABS: 500.0000 mg | ORAL_TABLET | Freq: Four times a day (QID) | ORAL | 0 refills | Status: AC

## 2021-01-09 MED ORDER — BUPIVACAINE-EPINEPHRINE (PF) 0.5% -1:200000 IJ SOLN
1.8000 mL | Freq: Once | INTRAMUSCULAR | Status: AC
  Administered 2021-01-09: 1.8 mL
  Filled 2021-01-09: qty 1.8

## 2021-01-09 NOTE — ED Triage Notes (Signed)
Pt presents to ED with complaints of upper left dental pain x 2 days.

## 2021-01-09 NOTE — Discharge Instructions (Addendum)
  Dental Pain You have been seen today for dental pain. You should follow up with a dentist as soon as possible. This problem will not resolve on its own without the care of a dentist.  Lidocaine liquid: Use the viscous lidocaine for mouth pain. Swish with the lidocaine and spit it out. Do not swallow it. Salt water solution: You should also swish with a homemade salt water solution, twice a day.  Make this solution by mixing 8 ounces of warm water with about half a teaspoon of salt.  The solution should be barely salty. If it is too salty, add more water. Antiinflammatory medications: Take 600 mg of ibuprofen every 6 hours or 440 mg (over the counter dose) to 500 mg (prescription dose) of naproxen every 12 hours for the next 3 days. After this time, these medications may be used as needed for pain. Take these medications with food to avoid upset stomach. Choose only one of these medications, do not take them together. Acetaminophen (generic for Tylenol): Should you continue to have additional pain while taking the ibuprofen or naproxen, you may add in acetaminophen as needed. Your daily total maximum amount of acetaminophen from all sources should be limited to 4000mg /day for persons without liver problems, or 2000mg /day for those with liver problems.  Please take all of your antibiotics until finished!   You may develop abdominal discomfort or diarrhea from the antibiotic.  You may help offset this with probiotics which you can buy or get in yogurt. Do not eat or take the probiotics until 2 hours after your antibiotic.   For prescription assistance, may try using prescription discount sites or apps, such as goodrx.[place], [address] [place], [place] [address] [phone]  [place] [address], [place] [address] [phone]  [facility] [address] [place], [place] [address] [phone]  ext. [facility] [address], [place] [address] [place], [place] [address] [phone]  [facility] [address] [place], [place] [address] [phone]  [facility] [employer] [email or web address]  [place] [address] [place], [place] [address] [phone]  Website for free, low-income, or sliding scale dental services in [place]: www.freedentalcare.[place]  To find a dentist in [place] and surrounding areas: CardCollectible.com.ee  [facility] [email or web address].gl  [facility] Medicaid Dentist [email or web address]

## 2021-01-09 NOTE — ED Provider Notes (Signed)
[facility] EMERGENCY DEPARTMENT Provider Note   CSN: [id number] Arrival date & time: 01/09/21  0849     History Chief Complaint  Patient presents with   Dental Pain    Barbara Snow is a 55 y.o. female.   Dental Pain Location:  Upper Upper teeth location:  15/LU 2nd molar and 2/RU 2nd molar Quality:  Aching, constant and shooting Severity:  Severe Onset quality:  Gradual Duration:  2 days Timing:  Constant Progression:  Worsening Chronicity:  New Context: dental caries and poor dentition   Context: not trauma   Relieved by:  Nothing Worsened by:  Touching, jaw movement and pressure Ineffective treatments:  Acetaminophen Associated symptoms: facial pain   Associated symptoms: no difficulty swallowing, no facial swelling, no fever, no neck pain, no neck swelling and no trismus       Barbara Snow is a 55 y.o. female, with a history of COPD, hyperlipidemia, HTN, TIA, presenting to the ED with dental pain for the last 2 days.  She endorses severe, aching, bilateral upper dental pain.  She acknowledges history of poor dentition.  Denies fever/chills, nausea/vomiting, neck pain/swelling, difficulty swallowing, difficulty breathing, facial swelling, trauma, or any other complaints.    Past Medical History:  Diagnosis Date   COPD (chronic obstructive pulmonary disease) ([place])    Depression    Hyperlipidemia    Hypertension    Kidney stone    Stroke ([place]) 10/2017    Patient Active Problem List   Diagnosis Date Noted   Intracranial atherosclerosis 12/05/2017   TIA (transient ischemic attack) 11/06/2017   Hypertension 11/06/2017   COPD (chronic obstructive pulmonary disease) ([place]) 11/06/2017    Past Surgical History:  Procedure Laterality Date   ABDOMINAL HYSTERECTOMY     CHOLECYSTECTOMY     TUBAL LIGATION       OB History   No obstetric history on file.     Family History  Problem Relation Age of Onset   Diabetes Mother    Cancer Mother    Heart  attack Father    Liver disease Father    Hypertension Daughter    Hypertension Daughter    Diabetes Sister    Hypertension Son     Social History   Tobacco Use   Smoking status: Former    Packs/day: 0.00    Years: 21.00    Pack years: 0.00    Types: Cigarettes    Quit date: 11/06/2017    Years since quitting: 3.1   Smokeless tobacco: Never  Vaping Use   Vaping Use: Never used  Substance Use Topics   Alcohol use: Yes    Comment: occ   Drug use: No    Home Medications Prior to Admission medications   Medication Sig Start Date End Date Taking? Authorizing Provider  lidocaine (XYLOCAINE) 2 % solution Use as directed 15 mLs in the mouth or throat as needed for mouth pain. 01/09/21  Yes Laniece Hornbaker C, PA-C  penicillin v potassium (VEETID) 500 MG tablet Take 1 tablet (500 mg total) by mouth 4 (four) times daily for 7 days. 01/09/21 01/16/21 Yes Darneisha Windhorst C, PA-C  amLODipine (NORVASC) 10 MG tablet Take 1 tablet (10 mg total) by mouth daily. 11/28/20   Soyla Dryer, PA-C  aspirin EC 81 MG tablet Take 81 mg by mouth daily. Swallow whole.    [provider]  atorvastatin (LIPITOR) 40 MG tablet TAKE 1 TABLET BY MOUTH AT BEDTIME 05/12/20   Soyla Dryer, PA-C  diphenhydrAMINE (BENADRYL) 25 MG tablet Take 25 mg by mouth every 6 (six) hours as needed.     [provider]  diphenhydramine-acetaminophen (TYLENOL PM) 25-500 MG TABS tablet Take 1 tablet by mouth at bedtime as needed.    [provider]  hydrochlorothiazide (HYDRODIURIL) 25 MG tablet Take 1 tablet (25 mg total) by mouth daily. 11/28/20   Soyla Dryer, PA-C  levothyroxine (SYNTHROID) 25 MCG tablet Take 1 tablet (25 mcg total) by mouth daily before breakfast. 08/27/20   Soyla Dryer, PA-C  lisinopril (ZESTRIL) 20 MG tablet Take 1 tablet by mouth once daily 10/07/20   Soyla Dryer, PA-C  pantoprazole (PROTONIX) 40 MG tablet Take 1 tablet (40 mg total) by mouth 2 (two) times daily. 11/28/20   Soyla Dryer, PA-C  potassium chloride (KLOR-CON) 10 MEQ tablet Take 1 tablet by mouth once daily 10/07/20   Soyla Dryer, PA-C    Allergies    Patient has no known allergies.  Review of Systems   Review of Systems  Constitutional:  Negative for fever.  HENT:  Positive for dental problem. Negative for facial swelling and trouble swallowing.   Cardiovascular:  Negative for chest pain.  Gastrointestinal:  Negative for nausea and vomiting.  Musculoskeletal:  Negative for neck pain.   Physical Exam Updated Vital Signs BP (!) 145/70 (BP Location: Left Wrist)   Pulse 70   Temp 97.9 F (36.6 C) (Oral)   Resp 18   Ht 5\' 6"  (1.676 m)   Wt 104.3 kg   SpO2 100%   BMI 37.12 kg/m   Physical Exam Vitals and nursing note reviewed.  Constitutional:      General: She is not in acute distress.    Appearance: Normal appearance. She is well-developed. She is not diaphoretic.  HENT:     Head: Normocephalic and atraumatic.     Mouth/Throat:     Mouth: Mucous membranes are moist.     Comments: Poor dentition throughout the patient's mouth.  She indicates pain to the maxillary rearmost molars bilaterally.  Erosion to these teeth without noted pulp exposure. No noted area of intraoral swelling or fluctuance.  No trismus or noted abnormal phonation.  Mouth opening to at least 3 finger widths.  Handles oral secretions without difficulty.  No noted facial swelling.  No sublingual swelling or tongue elevation.  No swelling or tenderness to the submental or submandibular regions.  No swelling or tenderness into the soft tissues of the neck. Eyes:     Conjunctiva/sclera: Conjunctivae normal.  Cardiovascular:     Rate and Rhythm: Normal rate and regular rhythm.  Pulmonary:     Effort: Pulmonary effort is normal.  Musculoskeletal:     Cervical back: Normal range of motion and neck supple. No tenderness.  Lymphadenopathy:     Cervical: No cervical adenopathy.  Skin:    General: Skin is warm and dry.      Coloration: Skin is not pale.  Neurological:     Mental Status: She is alert.  Psychiatric:        Behavior: Behavior normal.    ED Results / Procedures / Treatments   Labs (all labs ordered are listed, but only abnormal results are displayed) Labs Reviewed - No data to display  EKG None  Radiology No results found.  Procedures Dental Block  Date/Time: 01/09/2021 11:35 AM Performed by: Lorayne Bender, PA-C Authorized by: Lorayne Bender, PA-C   Consent:    Consent obtained:  Verbal   Consent  given by:  Patient   Risks, benefits, and alternatives were discussed: yes     Risks discussed:  Hematoma, nerve damage, pain, swelling and unsuccessful block Universal protocol:    Procedure explained and questions answered to patient or proxy's satisfaction: yes     Patient identity confirmed:  Verbally with patient and provided demographic data Indications:    Indications: dental pain   Location:    Block type:  Posterior superior alveolar   Laterality:  Left Procedure details:    Topical anesthetic:  Lidocaine gel   Syringe type:  Luer lock syringe   Needle gauge:  25 G   Anesthetic injected:  Bupivacaine 0.5% WITH epi   Injection procedure:  Anatomic landmarks identified, anatomic landmarks palpated, introduced needle, negative aspiration for blood and incremental injection Post-procedure details:    Outcome:  Anesthesia achieved   Procedure completion:  Tolerated well, no immediate complications Dental Block  Date/Time: 01/09/2021 11:40 AM Performed by: Lorayne Bender, PA-C Authorized by: Lorayne Bender, PA-C   Consent:    Consent obtained:  Verbal   Consent given by:  Patient   Risks, benefits, and alternatives were discussed: yes     Risks discussed:  Allergic reaction, infection, nerve damage, swelling, unsuccessful block, pain and hematoma Universal protocol:    Procedure explained and questions answered to patient or proxy's satisfaction: yes     Patient identity  confirmed:  Verbally with patient and provided demographic data Indications:    Indications: dental pain   Location:    Block type:  Posterior superior alveolar   Laterality:  Right Procedure details:    Topical anesthetic:  Lidocaine gel   Syringe type:  Luer lock syringe   Needle gauge:  25 G   Anesthetic injected:  Bupivacaine 0.5% WITH epi   Injection procedure:  Anatomic landmarks identified, anatomic landmarks palpated, introduced needle, negative aspiration for blood and incremental injection Post-procedure details:    Outcome:  Anesthesia achieved   Procedure completion:  Tolerated well, no immediate complications   Medications Ordered in ED Medications  bupivacaine-epinephrine (MARCAINE W/ EPI) 0.5% -1:200000 injection 1.8 mL (has no administration in time range)    ED Course  I have reviewed the triage vital signs and the nursing notes.  Pertinent labs & imaging results that were available during my care of the patient were reviewed by me and considered in my medical decision making (see chart for details).    MDM Rules/Calculators/A&P                          Patient presents with dental pain for the last couple days.  Low suspicion for sepsis or Ludewig's angioedema.  No obvious abscess noted on exam that would require bedside I&D here in the ED. Dental follow-up recommended.  Resources given. The patient was given instructions for home care as well as return precautions. Patient voices understanding of these instructions, accepts the plan, and is comfortable with discharge.     Final Clinical Impression(s) / ED Diagnoses Final diagnoses:  Pain, dental    Rx / DC Orders ED Discharge Orders          Ordered    penicillin v potassium (VEETID) 500 MG tablet  4 times daily        01/09/21 0922    lidocaine (XYLOCAINE) 2 % solution  As needed        01/09/21 1601  Lorayne Bender, PA-C 01/09/21 1145    Fredia Sorrow, MD 01/09/21 1554

## 2021-01-09 NOTE — ED Notes (Signed)
Dental box and marcaine at bedside

## 2021-01-24 ENCOUNTER — Other Ambulatory Visit: Payer: Self-pay | Admitting: Physician Assistant

## 2021-01-29 ENCOUNTER — Other Ambulatory Visit: Payer: Self-pay | Admitting: Physician Assistant

## 2021-01-29 DIAGNOSIS — I10 Essential (primary) hypertension: Secondary | ICD-10-CM

## 2021-01-29 DIAGNOSIS — E785 Hyperlipidemia, unspecified: Secondary | ICD-10-CM

## 2021-01-29 DIAGNOSIS — E039 Hypothyroidism, unspecified: Secondary | ICD-10-CM

## 2021-01-29 MED ORDER — ATORVASTATIN CALCIUM 40 MG PO TABS: 40.0000 mg | ORAL_TABLET | Freq: Every day | ORAL | 0 refills | Status: DC

## 2021-01-29 MED ORDER — POTASSIUM CHLORIDE ER 10 MEQ PO TBCR: 10.0000 meq | EXTENDED_RELEASE_TABLET | Freq: Every day | ORAL | 0 refills | Status: DC

## 2021-01-29 MED ORDER — LISINOPRIL 20 MG PO TABS: 20.0000 mg | ORAL_TABLET | Freq: Every day | ORAL | 0 refills | Status: DC

## 2021-01-29 MED ORDER — AMLODIPINE BESYLATE 10 MG PO TABS: 10.0000 mg | ORAL_TABLET | Freq: Every day | ORAL | 0 refills | Status: DC

## 2021-01-29 MED ORDER — HYDROCHLOROTHIAZIDE 25 MG PO TABS: 25.0000 mg | ORAL_TABLET | Freq: Every day | ORAL | 0 refills | Status: DC

## 2021-01-29 MED ORDER — LEVOTHYROXINE SODIUM 25 MCG PO TABS: 25.0000 ug | ORAL_TABLET | Freq: Every day | ORAL | 0 refills | Status: DC

## 2021-01-29 MED ORDER — PANTOPRAZOLE SODIUM 40 MG PO TBEC: 40.0000 mg | DELAYED_RELEASE_TABLET | Freq: Two times a day (BID) | ORAL | 0 refills | Status: DC

## 2021-01-31 ENCOUNTER — Other Ambulatory Visit (HOSPITAL_COMMUNITY)
Admission: RE | Admit: 2021-01-31 | Discharge: 2021-01-31 | Disposition: A | Discharge status: Home / Self Care | Payer: Self-pay | Source: Ambulatory Visit | Attending: Physician Assistant | Admitting: Physician Assistant

## 2021-01-31 DIAGNOSIS — E785 Hyperlipidemia, unspecified: Secondary | ICD-10-CM | POA: Insufficient documentation

## 2021-01-31 DIAGNOSIS — E039 Hypothyroidism, unspecified: Secondary | ICD-10-CM | POA: Insufficient documentation

## 2021-01-31 DIAGNOSIS — I10 Essential (primary) hypertension: Secondary | ICD-10-CM | POA: Insufficient documentation

## 2021-01-31 LAB — COMPREHENSIVE METABOLIC PANEL
ALT: 28 U/L (ref 0–44)
AST: 20 U/L (ref 15–41)
Albumin: 4 g/dL (ref 3.5–5.0)
Alkaline Phosphatase: 89 U/L (ref 38–126)
Anion gap: 9 (ref 5–15)
BUN: 19 mg/dL (ref 6–20)
CO2: 29 mmol/L (ref 22–32)
Calcium: 9.2 mg/dL (ref 8.9–10.3)
Chloride: 101 mmol/L (ref 98–111)
Creatinine, Ser: 0.97 mg/dL (ref 0.44–1.00)
GFR, Estimated: >60 mL/min (ref >60)
Glucose, Bld: 106 mg/dL — ABNORMAL HIGH (ref 70–99)
Potassium: 3.1 mmol/L — ABNORMAL LOW (ref 3.5–5.1)
Sodium: 139 mmol/L (ref 135–145)
Total Bilirubin: 0.6 mg/dL (ref 0.3–1.2)
Total Protein: 8.1 g/dL (ref 6.5–8.1)

## 2021-01-31 LAB — LIPID PANEL
Cholesterol: 181 mg/dL (ref 0–200)
HDL: 50 mg/dL (ref >40)
LDL Cholesterol: 93 mg/dL (ref 0–99)
Total CHOL/HDL Ratio: 3.6 RATIO
Triglycerides: 192 mg/dL — ABNORMAL HIGH (ref <150)
VLDL: 38 mg/dL (ref 0–40)

## 2021-01-31 LAB — TSH: TSH: 6.353 u[IU]/mL — ABNORMAL HIGH (ref 0.350–4.500)

## 2021-02-03 ENCOUNTER — Other Ambulatory Visit: Payer: Self-pay | Admitting: Physician Assistant

## 2021-02-03 ENCOUNTER — Encounter: Payer: Self-pay | Admitting: Physician Assistant

## 2021-02-03 ENCOUNTER — Other Ambulatory Visit (HOSPITAL_COMMUNITY): Payer: Self-pay | Admitting: Physician Assistant

## 2021-02-03 ENCOUNTER — Other Ambulatory Visit: Payer: Self-pay

## 2021-02-03 ENCOUNTER — Ambulatory Visit: Payer: Self-pay | Admitting: Physician Assistant

## 2021-02-03 ENCOUNTER — Other Ambulatory Visit (HOSPITAL_COMMUNITY)
Admission: RE | Admit: 2021-02-03 | Discharge: 2021-02-03 | Disposition: A | Discharge status: Home / Self Care | Payer: Self-pay | Source: Ambulatory Visit | Attending: Physician Assistant | Admitting: Physician Assistant

## 2021-02-03 VITALS — BP 132/76 | HR 65 | Temp 98.0°F | Wt 290.0 lb

## 2021-02-03 DIAGNOSIS — E039 Hypothyroidism, unspecified: Secondary | ICD-10-CM

## 2021-02-03 DIAGNOSIS — E785 Hyperlipidemia, unspecified: Secondary | ICD-10-CM

## 2021-02-03 DIAGNOSIS — R1310 Dysphagia, unspecified: Secondary | ICD-10-CM

## 2021-02-03 DIAGNOSIS — R928 Other abnormal and inconclusive findings on diagnostic imaging of breast: Secondary | ICD-10-CM

## 2021-02-03 DIAGNOSIS — N6310 Unspecified lump in the right breast, unspecified quadrant: Secondary | ICD-10-CM

## 2021-02-03 DIAGNOSIS — Z124 Encounter for screening for malignant neoplasm of cervix: Secondary | ICD-10-CM

## 2021-02-03 DIAGNOSIS — Z1211 Encounter for screening for malignant neoplasm of colon: Secondary | ICD-10-CM

## 2021-02-03 DIAGNOSIS — E876 Hypokalemia: Secondary | ICD-10-CM

## 2021-02-03 DIAGNOSIS — I10 Essential (primary) hypertension: Secondary | ICD-10-CM

## 2021-02-03 MED ORDER — LEVOTHYROXINE SODIUM 50 MCG PO TABS: 50.0000 ug | ORAL_TABLET | Freq: Every day | ORAL | 0 refills | Status: DC

## 2021-02-03 MED ORDER — HYDROCHLOROTHIAZIDE 25 MG PO TABS: 25.0000 mg | ORAL_TABLET | Freq: Every day | ORAL | 0 refills | Status: DC

## 2021-02-03 MED ORDER — POTASSIUM CHLORIDE ER 10 MEQ PO TBCR: 10.0000 meq | EXTENDED_RELEASE_TABLET | Freq: Every day | ORAL | 0 refills | Status: DC

## 2021-02-03 MED ORDER — LISINOPRIL 20 MG PO TABS: 20.0000 mg | ORAL_TABLET | Freq: Every day | ORAL | 0 refills | Status: DC

## 2021-02-03 MED ORDER — ATORVASTATIN CALCIUM 40 MG PO TABS: 40.0000 mg | ORAL_TABLET | Freq: Every day | ORAL | 0 refills | Status: DC

## 2021-02-03 MED ORDER — AMLODIPINE BESYLATE 10 MG PO TABS: 10.0000 mg | ORAL_TABLET | Freq: Every day | ORAL | 0 refills | Status: DC

## 2021-02-03 NOTE — Progress Notes (Signed)
BP 132/76   Pulse 65   Temp 98 F (36.7 C)   Wt 290 lb (131.5 kg)   SpO2 96%   BMI 46.81 kg/m    Subjective:    Patient ID: Barbara Snow, female    DOB: 1966/05/28, 55 y.o.   MRN: [id number]  HPI: Barbara Snow is a 55 y.o. female presenting on 02/03/2021 for Hypertension, Dysphagia, Gynecologic Exam, and Hyperlipidemia   HPI    Pt had a negative covid 19 screening questionnaire.  Chief Complaint  Patient presents with   Hypertension   Dysphagia   Gynecologic Exam   Hyperlipidemia      Pt says she is Still working at [employer].    She was out of her K+ for about a week but is taking it again.  LMP about 4 years ago.     She says her dysphagia is improved  She says she is doing well and has no complaints today.      Relevant past medical, surgical, family and social history reviewed and updated as indicated. Interim medical history since our last visit reviewed. Allergies and medications reviewed and updated.    Current Outpatient Medications:    amLODipine (NORVASC) 10 MG tablet, Take 1 tablet (10 mg total) by mouth daily., Disp: 30 tablet, Rfl: 0   aspirin EC 81 MG tablet, Take 81 mg by mouth daily. Swallow whole., Disp: , Rfl:    atorvastatin (LIPITOR) 40 MG tablet, Take 1 tablet (40 mg total) by mouth at bedtime., Disp: 30 tablet, Rfl: 0   diphenhydrAMINE (BENADRYL) 25 MG tablet, Take 25 mg by mouth every 6 (six) hours as needed. , Disp: , Rfl:    diphenhydramine-acetaminophen (TYLENOL PM) 25-500 MG TABS tablet, Take 1 tablet by mouth at bedtime as needed., Disp: , Rfl:    hydrochlorothiazide (HYDRODIURIL) 25 MG tablet, Take 1 tablet (25 mg total) by mouth daily., Disp: 30 tablet, Rfl: 0   levothyroxine (SYNTHROID) 25 MCG tablet, Take 1 tablet (25 mcg total) by mouth daily before breakfast., Disp: 30 tablet, Rfl: 0   lisinopril (ZESTRIL) 20 MG tablet, Take 1 tablet (20 mg total) by mouth daily., Disp: 30 tablet, Rfl: 0   pantoprazole (PROTONIX)  40 MG tablet, Take 1 tablet (40 mg total) by mouth 2 (two) times daily., Disp: 60 tablet, Rfl: 0   potassium chloride (KLOR-CON) 10 MEQ tablet, Take 1 tablet (10 mEq total) by mouth daily., Disp: 30 tablet, Rfl: 0   lidocaine (XYLOCAINE) 2 % solution, Use as directed 15 mLs in the mouth or throat as needed for mouth pain. (Patient not taking: Reported on 02/03/2021), Disp: 100 mL, Rfl: 2    Review of Systems  Per HPI unless specifically indicated above     Objective:    BP 132/76   Pulse 65   Temp 98 F (36.7 C)   Wt 290 lb (131.5 kg)   SpO2 96%   BMI 46.81 kg/m   Wt Readings from Last 3 Encounters:  02/03/21 290 lb (131.5 kg)  01/09/21 230 lb (104.3 kg)  11/28/20 297 lb (134.7 kg)    Physical Exam Vitals and nursing note reviewed.  Constitutional:      General: She is not in acute distress.    Appearance: She is well-developed. She is obese. She is not toxic-appearing.  HENT:     Head: Normocephalic and atraumatic.  Pulmonary:     Effort: Pulmonary effort is normal.  Chest:  Breasts:  Right: Normal.     Left: Normal.  Abdominal:     Palpations: Abdomen is soft. There is no mass.     Tenderness: There is no abdominal tenderness. There is no guarding or rebound.  Genitourinary:    Labia:        Right: No rash, tenderness or lesion.        Left: No rash, tenderness or lesion.      Vagina: Normal.     Cervix: No cervical motion tenderness, discharge or friability.     Adnexa:        Right: No mass, tenderness or fullness.         Left: No mass, tenderness or fullness.       Comments: Curator assisted) Musculoskeletal:     Right lower leg: No edema.     Left lower leg: No edema.  Skin:    General: Skin is warm and dry.  Neurological:     Mental Status: She is alert and oriented to person, place, and time.  Psychiatric:        Behavior: Behavior normal.    Results for orders placed or performed during the hospital encounter of 01/31/21  TSH   Result Value Ref Range   TSH 6.353 (H) 0.350 - 4.500 uIU/mL  Lipid panel  Result Value Ref Range   Cholesterol 181 0 - 200 mg/dL   Triglycerides 192 (H) <150 mg/dL   HDL 50 >40 mg/dL   Total CHOL/HDL Ratio 3.6 RATIO   VLDL 38 0 - 40 mg/dL   LDL Cholesterol 93 0 - 99 mg/dL  Comprehensive metabolic panel  Result Value Ref Range   Sodium 139 135 - 145 mmol/L   Potassium 3.1 (L) 3.5 - 5.1 mmol/L   Chloride 101 98 - 111 mmol/L   CO2 29 22 - 32 mmol/L   Glucose, Bld 106 (H) 70 - 99 mg/dL   BUN 19 6 - 20 mg/dL   Creatinine, Ser 0.97 0.44 - 1.00 mg/dL   Calcium 9.2 8.9 - 10.3 mg/dL   Total Protein 8.1 6.5 - 8.1 g/dL   Albumin 4.0 3.5 - 5.0 g/dL   AST 20 15 - 41 U/L   ALT 28 0 - 44 U/L   Alkaline Phosphatase 89 38 - 126 U/L   Total Bilirubin 0.6 0.3 - 1.2 mg/dL   GFR, Estimated >60 >60 mL/min   Anion gap 9 5 - 15      Assessment & Plan:     Encounter Diagnoses  Name Primary?   Routine Papanicolaou smear Yes   Essential hypertension    Hypothyroidism, unspecified type    Hyperlipidemia, unspecified hyperlipidemia type    Dysphagia, unspecified type    Morbid obesity ([place])    Hypokalemia    Screening for colon cancer    Abnormal mammogram       -Reviewed labs with pt -Increase levothyroxine -Increase K+ to bid x 1 week then return to takiong it qd -pt to Continue other meds -refer for Mammogram; she needs diagnostic for follow up on diagnostic mammogram done in December -pt is given FIT test for colon cancer screening  -Med refills sent to [facility] -pt to Follow up 3 months.  She is to contact office sooner prn

## 2021-02-03 NOTE — Patient Instructions (Signed)
INCREASE POTASSIUM TO TWICE DAILY FOR ONE WEEK AND THEN RETURN TO TAKING IT ONCE DAILY

## 2021-02-06 ENCOUNTER — Other Ambulatory Visit: Payer: Self-pay | Admitting: Physician Assistant

## 2021-02-06 DIAGNOSIS — R87610 Atypical squamous cells of undetermined significance on cytologic smear of cervix (ASC-US): Secondary | ICD-10-CM

## 2021-02-06 LAB — CYTOLOGY - PAP
Comment: NEGATIVE
Diagnosis: UNDETERMINED — AB
High risk HPV: POSITIVE — AB

## 2021-02-06 MED ORDER — METRONIDAZOLE 500 MG PO TABS: 2000.0000 mg | ORAL_TABLET | Freq: Once | ORAL | 0 refills | Status: AC

## 2021-02-20 ENCOUNTER — Ambulatory Visit (HOSPITAL_COMMUNITY)
Admission: RE | Admit: 2021-02-20 | Discharge: 2021-02-20 | Disposition: A | Discharge status: Home / Self Care | Payer: Self-pay | Source: Ambulatory Visit | Attending: Physician Assistant | Admitting: Physician Assistant

## 2021-02-20 ENCOUNTER — Encounter (HOSPITAL_COMMUNITY): Payer: Self-pay

## 2021-02-20 ENCOUNTER — Other Ambulatory Visit: Payer: Self-pay

## 2021-02-20 DIAGNOSIS — N6310 Unspecified lump in the right breast, unspecified quadrant: Secondary | ICD-10-CM

## 2021-02-28 ENCOUNTER — Telehealth: Payer: Self-pay

## 2021-02-28 NOTE — Telephone Encounter (Signed)
Client called stating she had prescriptions she tried to call into [facility] and she was told she did not have the correct numbers.  Reviewed all medications with client and discussed that her provider at [facility] sent all medications that she prescribes and are available at [facility] on 02/03/21  Reviewed which ones would not be available she would have at her preferred local pharmacy.  This RN [employer] and confirmed medications were mailed and delivered to her mailbox on 02/24/21 and confirmed the address to be correct.  Called client back to let her know her medications were delivered 02/24/21 and confirmed her address. She states she gets off work late some nights and doesn't check her mail, she will check her mail after she gets off today at [facility]. She will let [facility] know if she has issues. Client agreeable.  [place] [employer]

## 2021-03-05 ENCOUNTER — Telehealth: Payer: Self-pay | Admitting: Obstetrics & Gynecology

## 2021-03-05 NOTE — Telephone Encounter (Signed)
Pt called to check on [facility] Worried because her PAP was abnormal & may need colpo  Please advise (Daisy sent this to you already)

## 2021-03-21 ENCOUNTER — Encounter: Payer: Self-pay | Admitting: Obstetrics & Gynecology

## 2021-04-08 ENCOUNTER — Other Ambulatory Visit: Payer: Self-pay | Admitting: Physician Assistant

## 2021-04-18 ENCOUNTER — Encounter (HOSPITAL_COMMUNITY): Payer: Self-pay | Admitting: *Deleted

## 2021-04-18 ENCOUNTER — Other Ambulatory Visit: Payer: Self-pay

## 2021-04-18 ENCOUNTER — Encounter (HOSPITAL_COMMUNITY): Payer: Self-pay

## 2021-04-18 ENCOUNTER — Inpatient Hospital Stay (HOSPITAL_COMMUNITY): Payer: Self-pay | Attending: Obstetrics and Gynecology | Admitting: *Deleted

## 2021-04-18 VITALS — BP 140/73 | Wt 292.0 lb

## 2021-04-18 DIAGNOSIS — R87610 Atypical squamous cells of undetermined significance on cytologic smear of cervix (ASC-US): Secondary | ICD-10-CM

## 2021-04-18 DIAGNOSIS — R87810 Cervical high risk human papillomavirus (HPV) DNA test positive: Secondary | ICD-10-CM

## 2021-04-18 DIAGNOSIS — Z1239 Encounter for other screening for malignant neoplasm of breast: Secondary | ICD-10-CM

## 2021-04-18 NOTE — Progress Notes (Signed)
    Patient name: Barbara Snow MRN [id number]  Date of birth: 03/31/1966 Chief Complaint:   Colposcopy  History of Present Illness:   Barbara Snow is a 55 y.o. PH, supracervical hysterectomy female being seen today for cervical dysplasia management.  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 Quit a few mos ago New sexual partner:  No.  Denies postcoital bleeding  No LMP recorded. Patient has had a hysterectomy.  Review of Systems:   Pertinent items are noted in HPI Denies fever/chills, dizziness, headaches, visual disturbances, fatigue, shortness of breath, chest pain, abdominal pain, vomiting, bowel movements, urination, or intercourse unless otherwise stated above.  Pertinent History Reviewed:  Reviewed past medical,surgical, social, obstetrical and family history.  Reviewed problem list, medications and allergies. Physical Assessment:   Vitals:   04/21/21 1019  BP: 139/77  Pulse: 65  Weight: 293 lb 6.4 oz (133.1 kg)  Height: 5\' 7"  (1.702 m)  Body mass index is 45.95 kg/m.       Physical Examination:   General appearance: alert, well appearing, and in no distress  Psych: mood appropriate, normal affect  Skin: warm & dry   Cardiovascular: normal heart rate noted  Respiratory: normal respiratory effort, no distress  Abdomen: obese, soft, non-tender   Pelvic: VULVA: normal appearing vulva with no masses, tenderness or lesions, VAGINA: normal appearing vagina with normal color and discharge, no lesions, CERVIX: normal appearing cervix without discharge or lesions, see colposcopy below  Extremities: no edema   Chaperone: Angel Neas     Colposcopy Procedure Note  Indications: ASCUS, HPV+    Procedure Details  The risks and benefits of the procedure and Written informed consent obtained.  Speculum placed in vagina and excellent visualization of  cervix achieved, cervix swabbed x 3 with acetic acid solution.  Findings: Adequate colposcopy is noted today.  Cervix: no visible lesions, + acetowhite changes (s) noted at 12 o'clock, no mosaicism,  endocervical curettage performed and cervical biopsies taken at 12 o'clock.  Specimens: ECC and cervical biopsy x 1  Complications: none.  Colposcopic Impression:   Plan(Based on 2019 ASCCP recommendations)  Reviewed HPV and ASCCP guidelines.  Discussed HPV's ability to cause dysplasia and ultimately cervical cancer.  Reviewed colposcopy today and biopsies obtained. -Specimens labelled and sent to Pathology. -Further management pending results  Janyth Pupa, DO Attending [place], [place] for [place], [place]

## 2021-04-18 NOTE — Progress Notes (Signed)
[name] is a 55 y.o. female who presents to [facility] clinic today with no complaints. Patient referred to [facility] by the [facility] due to having an abnormal Pap smear 02/03/2021 that a colposcopy is recommended for follow-up.   Pap Smear: Pap smear not completed today. Last Pap smear was 02/03/2021 at the [facility] and was abnormal - ASCUS with positive HPV . Patient has history of an abnormal Pap smear 10/23/2010 that was ASCUS with positive. Patient stated she was unaware she had an abnormal Pap smear in the past. Patient had a supracervical hysterectomy 10/29/2010 due to fibroids. Last two Pap smear and hysterectomy results is available in Epic.   Physical exam: Breasts Left breast slightly larger than right breast that per patient has not noticed any changes. No skin abnormalities bilateral breasts. No nipple retraction bilateral breasts. No nipple discharge bilateral breasts. No lymphadenopathy. No lumps palpated bilateral breasts. No complaints of pain or tenderness on exam.     MS DIGITAL SCREENING TOMO BILATERAL  Result Date: 10/30/2019 CLINICAL DATA:  Screening. EXAM: DIGITAL SCREENING BILATERAL MAMMOGRAM WITH TOMO AND CAD COMPARISON:  None. ACR Breast Density Category b: There are scattered areas of fibroglandular density. FINDINGS: In the right breast, masses warrant further evaluation. In the left breast, no findings suspicious for malignancy. Images were processed with CAD. IMPRESSION: Further evaluation is suggested for possible masses in the right breast. RECOMMENDATION: Diagnostic mammogram and possibly ultrasound of the right breast. (Code:[id number]-80M) The patient will be contacted regarding the findings, and additional imaging will be scheduled. BI-RADS CATEGORY  0: Incomplete. Need additional imaging evaluation and/or prior mammograms for comparison. Electronically Signed   By: Lovey Newcomer M.D.   On: 10/30/2019 10:00   MS DIGITAL DIAG TOMO  BILAT  Result Date: 02/20/2021 CLINICAL DATA:  One year follow-up for probably benign masses in the RIGHT breast. EXAM: DIGITAL DIAGNOSTIC BILATERAL MAMMOGRAM WITH TOMOSYNTHESIS AND CAD; ULTRASOUND RIGHT BREAST LIMITED TECHNIQUE: Bilateral digital diagnostic mammography and breast tomosynthesis was performed. The images were evaluated with computer-aided detection.; Targeted ultrasound examination of the right breast was performed COMPARISON:  Previous exam(s). ACR Breast Density Category b: There are scattered areas of fibroglandular density. FINDINGS: LEFT breast is negative. Circumscribed oval masses are again noted in the LATERAL portion of the RIGHT breast and further evaluated with ultrasound. Targeted ultrasound is performed, showing a circumscribed oval mass in the 9 o'clock location of the RIGHT breast 4 centimeters from the nipple which measures 0.7 x 0.3 x 0.6 centimeters. In the 10 o'clock location 4 centimeters from the nipple, an oval mass is 0.9 x 0.6 x 1.1 centimeters. No new masses or acoustic shadowing identified. IMPRESSION: 1. Stable appearance of benign-appearing masses in the RIGHT breast. 2.  No mammographic or ultrasound evidence for malignancy. RECOMMENDATION: Recommend bilateral diagnostic mammogram and RIGHT breast ultrasound in 1 year to complete follow-up. I have discussed the findings and recommendations with the patient. If applicable, a reminder letter will be sent to the patient regarding the next appointment. BI-RADS CATEGORY  3: Probably benign. Electronically Signed   By: Nolon Nations M.D.   On: 02/20/2021 16:01  MS DIGITAL DIAG TOMO UNI RIGHT  Result Date: 06/11/2020 CLINICAL DATA:  Short-term follow-up for probably benign right breast masses. EXAM: DIGITAL DIAGNOSTIC UNILATERAL RIGHT MAMMOGRAM WITH TOMO AND CAD; ULTRASOUND RIGHT BREAST LIMITED COMPARISON:  Previous exams. ACR Breast Density Category b: There are scattered areas of fibroglandular density. FINDINGS: No  suspicious  masses or calcifications are seen in the right breast. The oval circumscribed masses in the slightly outer right breast appear unchanged. Mammographic images were processed with CAD. Targeted ultrasound of the outer right breast was performed. The oval circumscribed gently lobulated mass in the right breast at 10 o'clock 4 cm from nipple measures 1.1 x 0.5 x 1.2 cm, previously measured 1.2 x 0.6 x 1.2 cm. This is overall unchanged in size in appearance when compared to the prior exam. The additional mass at the 9 o'clock position 4 cm from nipple measured 0.8 x 0.3 cm RAD however could not be adequately reproduced in the other plane. No additional masses identified in the outer right breast. IMPRESSION: Stable appearance of probably benign masses in the outer right breast. RECOMMENDATION: Bilateral diagnostic mammography in 6 months with right breast ultrasound which will demonstrate 1 year of stability of the probably benign right breast masses. I have discussed the findings and recommendations with the patient. If applicable, a reminder letter will be sent to the patient regarding the next appointment. BI-RADS CATEGORY  3: Probably benign. Electronically Signed   By: Everlean Alstrom M.D.   On: 06/11/2020 14:37   MS DIGITAL DIAG TOMO UNI RIGHT  Result Date: 11/07/2019 CLINICAL DATA:  55 year old female presenting as a recall from screening for possible right breast masses. EXAM: DIGITAL DIAGNOSTIC RIGHT MAMMOGRAM WITH TOMO ULTRASOUND RIGHT BREAST COMPARISON:  Previous exam(s). ACR Breast Density Category b: There are scattered areas of fibroglandular density. FINDINGS: Mammogram: Spot compression tomosynthesis views of the right breast performed. On the additional imaging there is persistence of to nearby obscured masses in the upper-outer quadrant of the right breast measuring approximately 1 cm and 0.8 cm. Ultrasound: Targeted ultrasound is performed in the upper-outer quadrant of the right breast  demonstrating an oval circumscribed hypoechoic mass at 9 o'clock 4 cm from the nipple measuring 0.8 x 0.3 x 0.7 cm. Targeted ultrasound performed at 10 o'clock 4 cm from the nipple demonstrates an oval circumscribed hypoechoic mass measuring 1.2 x 0.6 x 1.2 cm. These masses likely corresponds to the mass identified mammographically. Targeted ultrasound of the right axilla demonstrates normal appearing lymph nodes. 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 RECOMMENDATION: Diagnostic right breast mammogram and ultrasound in 6 months. I have discussed the findings and recommendations with the patient. If applicable, a reminder letter will be sent to the patient regarding the next appointment. BI-RADS CATEGORY  3: Probably benign. Electronically Signed   By: Audie Pinto M.D.   On: 11/07/2019 09:10    Pelvic/Bimanual Pap is not indicated today per BCCCP guidelines.   Smoking History: Patient is a former smoker that quit 8-9 years ago.   Patient Navigation: Patient education provided. Access to services provided for patient through [place] program.   Colorectal Cancer Screening: Per patient has never had colonoscopy completed. Patient was given a FIT Test by her PCP and will complete. No complaints today.    Breast and Cervical Cancer Risk Assessment: Patient does not have family history of breast cancer, known genetic mutations, or radiation treatment to the chest before age 82. Patient does not have history of cervical dysplasia, immunocompromised, or DES exposure in-utero.  Risk Assessment     Risk Scores       04/18/2021   Last edited by: Loletta Parish, RN   5-year risk: 1 %   Lifetime risk: 6.2 %  A: BCCCP exam without pap smear No complaints.  P: Referred patient to the [facility] for [place] at [facility] for a colposcopy.  Appointment scheduled Monday, April 21, 2021 at 1010.   Loletta Parish, RN 04/18/2021 9:05 AM

## 2021-04-18 NOTE — Patient Instructions (Addendum)
Explained breast self awareness with Barbara Snow. Patient did not need a Pap smear today due to last Pap smear was 02/03/2021. Explained the colposcopy the recommended follow-up for her abnormal Pap smear. Referred patient to the [facility] for [place] at [facility] for a colposcopy. Appointment scheduled Monday, April 21, 2021 at 1010. Patient aware of appointment and will be there. Let patient know a bilateral diagnostic mammogram is due the end of August 2023 and that will be covered through [facility]. Barbara Snow verbalized understanding.  Jorel Gravlin, [place], RN 9:43 AM

## 2021-04-21 ENCOUNTER — Encounter: Payer: Self-pay | Admitting: Obstetrics & Gynecology

## 2021-04-21 ENCOUNTER — Ambulatory Visit (INDEPENDENT_AMBULATORY_CARE_PROVIDER_SITE_OTHER): Payer: Self-pay | Admitting: Obstetrics & Gynecology

## 2021-04-21 ENCOUNTER — Other Ambulatory Visit: Payer: Self-pay

## 2021-04-21 ENCOUNTER — Other Ambulatory Visit (HOSPITAL_COMMUNITY)
Admission: RE | Admit: 2021-04-21 | Discharge: 2021-04-21 | Disposition: A | Discharge status: Home / Self Care | Payer: Self-pay | Source: Ambulatory Visit | Attending: Obstetrics & Gynecology | Admitting: Obstetrics & Gynecology

## 2021-04-21 VITALS — BP 139/77 | HR 65 | Ht 67.0 in | Wt 293.4 lb

## 2021-04-21 DIAGNOSIS — R87810 Cervical high risk human papillomavirus (HPV) DNA test positive: Secondary | ICD-10-CM | POA: Insufficient documentation

## 2021-04-21 DIAGNOSIS — R87610 Atypical squamous cells of undetermined significance on cytologic smear of cervix (ASC-US): Secondary | ICD-10-CM

## 2021-04-23 ENCOUNTER — Other Ambulatory Visit: Payer: Self-pay | Admitting: Physician Assistant

## 2021-04-23 DIAGNOSIS — I10 Essential (primary) hypertension: Secondary | ICD-10-CM

## 2021-04-23 DIAGNOSIS — E785 Hyperlipidemia, unspecified: Secondary | ICD-10-CM

## 2021-04-23 DIAGNOSIS — E039 Hypothyroidism, unspecified: Secondary | ICD-10-CM

## 2021-04-23 LAB — SURGICAL PATHOLOGY

## 2021-04-29 ENCOUNTER — Encounter: Payer: Self-pay | Admitting: Physician Assistant

## 2021-04-29 ENCOUNTER — Telehealth: Payer: Self-pay | Admitting: Obstetrics & Gynecology

## 2021-04-29 NOTE — Telephone Encounter (Signed)
Called patient and reviewed prior procedure as well as results of pathology.  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  Reviewed management options including proceeding with surgical resection.  Pt reports her mother had cancer and she would prefer to proceed with excisional/removal.  Discussed plan for LEEP- reviewed risk/benefit and indications including but not limited to risk of bleeding and infection.  Questions and concerns were addressed and she desires to proceed at next available.  Barbara Snow called to schedule for Nov. 8th  Barbara Pupa, DO Attending [place], [facility] for [employer], [place]

## 2021-04-29 NOTE — Telephone Encounter (Signed)
Patient called stating that she had a procedure done with Dr. Nelda Marseille last week and she really did not understand the procedure very well. Patient would like a call back from a provider to explain.

## 2021-04-30 DIAGNOSIS — N871 Moderate cervical dysplasia: Secondary | ICD-10-CM

## 2021-04-30 NOTE — Patient Instructions (Signed)
Your procedure is scheduled on: 05/06/2021  Report to [facility] at   9:30  AM.  Call this number if you have problems the morning of surgery: [phone]   Remember:   Do not Eat or Drink after midnight         No Smoking the morning of surgery  :  Take these medicines the morning of surgery with A SIP OF WATER: levothyroxine and Pantoprazole   Do not wear jewelry, make-up or nail polish.  Do not wear lotions, powders, or perfumes. You may wear deodorant.  Do not shave 48 hours prior to surgery. Men may shave face and neck.  Do not bring valuables to the hospital.  Contacts, dentures or bridgework may not be worn into surgery.  Leave suitcase in the car. After surgery it may be brought to your room.  For patients admitted to the hospital, checkout time is 11:00 AM the day of discharge.   Patients discharged the day of surgery will not be allowed to drive home.    Special Instructions: Shower using CHG night before surgery and shower the day of surgery use CHG.  Use special wash - you have one bottle of CHG for all showers.  You should use approximately 1/2 of the bottle for each shower.  How to Use Chlorhexidine for Bathing Chlorhexidine gluconate (CHG) is a germ-killing (antiseptic) solution that is used to clean the skin. It can get rid of the bacteria that normally live on the skin and can keep them away for about 24 hours. To clean your skin with CHG, you may be given: A CHG solution to use in the shower or as part of a sponge bath. A prepackaged cloth that contains CHG. Cleaning your skin with CHG may help lower the risk for infection: While you are staying in the intensive care unit of the hospital. If you have a vascular access, such as a central line, to provide short-term or long-term access to your veins. If you have a catheter to drain urine from your bladder. If you are on a ventilator. A ventilator is a machine that helps you breathe by moving air in and  out of your lungs. After surgery. What are the risks? Risks of using CHG include: A skin reaction. Hearing loss, if CHG gets in your ears and you have a perforated eardrum. Eye injury, if CHG gets in your eyes and is not rinsed out. The CHG product catching fire. Make sure that you avoid smoking and flames after applying CHG to your skin. Do not use CHG: If you have a chlorhexidine allergy or have previously reacted to chlorhexidine. On babies younger than 10 months of age. How to use CHG solution Use CHG only as told by your health care provider, and follow the instructions on the label. Use the full amount of CHG as directed. Usually, this is one bottle. During a shower Follow these steps when using CHG solution during a shower (unless your health care provider gives you different instructions): Start the shower. Use your normal soap and shampoo to wash your face and hair. Turn off the shower or move out of the shower stream. Pour the CHG onto a clean washcloth. Do not use any type of brush or rough-edged sponge. Starting at your neck, lather your body down to your toes. Make sure you follow these instructions: If you will be having surgery, pay special attention to the part of your body where you will be having  surgery. Scrub this area for at least 1 minute. Do not use CHG on your head or face. If the solution gets into your ears or eyes, rinse them well with water. Avoid your genital area. Avoid any areas of skin that have broken skin, cuts, or scrapes. Scrub your back and under your arms. Make sure to wash skin folds. Let the lather sit on your skin for 1-2 minutes or as long as told by your health care provider. Thoroughly rinse your entire body in the shower. Make sure that all body creases and crevices are rinsed well. Dry off with a clean towel. Do not put any substances on your body afterward--such as powder, lotion, or perfume--unless you are told to do so by your health care  provider. Only use lotions that are recommended by the manufacturer. Put on clean clothes or pajamas. If it is the night before your surgery, sleep in clean sheets.  During a sponge bath Follow these steps when using CHG solution during a sponge bath (unless your health care provider gives you different instructions): Use your normal soap and shampoo to wash your face and hair. Pour the CHG onto a clean washcloth. Starting at your neck, lather your body down to your toes. Make sure you follow these instructions: If you will be having surgery, pay special attention to the part of your body where you will be having surgery. Scrub this area for at least 1 minute. Do not use CHG on your head or face. If the solution gets into your ears or eyes, rinse them well with water. Avoid your genital area. Avoid any areas of skin that have broken skin, cuts, or scrapes. Scrub your back and under your arms. Make sure to wash skin folds. Let the lather sit on your skin for 1-2 minutes or as long as told by your health care provider. Using a different clean, wet washcloth, thoroughly rinse your entire body. Make sure that all body creases and crevices are rinsed well. Dry off with a clean towel. Do not put any substances on your body afterward--such as powder, lotion, or perfume--unless you are told to do so by your health care provider. Only use lotions that are recommended by the manufacturer. Put on clean clothes or pajamas. If it is the night before your surgery, sleep in clean sheets. How to use CHG prepackaged cloths Only use CHG cloths as told by your health care provider, and follow the instructions on the label. Use the CHG cloth on clean, dry skin. Do not use the CHG cloth on your head or face unless your health care provider tells you to. When washing with the CHG cloth: Avoid your genital area. Avoid any areas of skin that have broken skin, cuts, or scrapes. Before surgery Follow these steps  when using a CHG cloth to clean before surgery (unless your health care provider gives you different instructions): Using the CHG cloth, vigorously scrub the part of your body where you will be having surgery. Scrub using a back-and-forth motion for 3 minutes. The area on your body should be completely wet with CHG when you are done scrubbing. Do not rinse. Discard the cloth and let the area air-dry. Do not put any substances on the area afterward, such as powder, lotion, or perfume. Put on clean clothes or pajamas. If it is the night before your surgery, sleep in clean sheets.  For general bathing Follow these steps when using CHG cloths for general bathing (unless your health  care provider gives you different instructions). Use a separate CHG cloth for each area of your body. Make sure you wash between any folds of skin and between your fingers and toes. Wash your body in the following order, switching to a new cloth after each step: The front of your neck, shoulders, and chest. Both of your arms, under your arms, and your hands. Your stomach and groin area, avoiding the genitals. Your right leg and foot. Your left leg and foot. The back of your neck, your back, and your buttocks. Do not rinse. Discard the cloth and let the area air-dry. Do not put any substances on your body afterward--such as powder, lotion, or perfume--unless you are told to do so by your health care provider. Only use lotions that are recommended by the manufacturer. Put on clean clothes or pajamas. Contact a health care provider if: Your skin gets irritated after scrubbing. You have questions about using your solution or cloth. You swallow any chlorhexidine. Call your local poison control center (1-604-174-9968 in the [place] Get help right away if: Your eyes itch badly, or they become very red or swollen. Your skin itches badly and is red or swollen. Your hearing changes. You have trouble seeing. You have swelling or  tingling in your mouth or throat. You have trouble breathing. These symptoms may represent a serious problem that is an emergency. Do not wait to see if the symptoms will go away. Get medical help right away. Call your local emergency services (911 in the [place] Do not drive yourself to the hospital. Summary Chlorhexidine gluconate (CHG) is a germ-killing (antiseptic) solution that is used to clean the skin. Cleaning your skin with CHG may help to lower your risk for infection. You may be given CHG to use for bathing. It may be in a bottle or in a prepackaged cloth to use on your skin. Carefully follow your health care provider's instructions and the instructions on the product label. Do not use CHG if you have a chlorhexidine allergy. Contact your health care provider if your skin gets irritated after scrubbing. This information is not intended to replace advice given to you by your health care provider. Make sure you discuss any questions you have with your health care provider. Document Revised: 08/26/2020 Document Reviewed: 08/26/2020 Elsevier Patient Education  2022 [place] Electrosurgical Excision Procedure Loop electrosurgical excision procedure (LEEP) is the cutting and removal (excision) of tissue from the cervix. The cervix is the bottom part of the uterus that opens into the vagina. The tissue that is removed from the cervix is examined to see if there are precancerous cells or cancer cells present. LEEP may be done when: You have abnormal bleeding from your cervix. You have an abnormal Pap test result. Your health care provider finds an abnormality on your cervix during a pelvic exam. LEEP typically only takes a few minutes and is often done in the health care provider's office. The procedure is safe for women who are trying to get pregnant. However, the procedure is usually not done during a menstrual period or during pregnancy. Tell a health care provider about: Any  allergies you have. All medicines you are taking, including vitamins, herbs, eye drops, creams, and over-the-counter medicines. Any blood disorders you have. Any medical conditions you have, including current or past vaginal infections such as herpes or sexually-transmitted infections (STIs). Whether you are pregnant or may be pregnant. Whether or not you are having vaginal bleeding on the day of  the procedure. What are the risks? Generally, this is a safe procedure. However, problems may occur, including: Infection. Bleeding. Allergic reactions to medicines. Changes or scarring in the cervix. Increased risk of early (preterm) labor in future pregnancies. What happens before the procedure? Ask your health care provider about: Changing or stopping your regular medicines. This is especially important if you are taking diabetes medicines or blood thinners. Taking medicines such as aspirin and ibuprofen. These medicines can thin your blood. Do not take these medicines unless your health care provider tells you to take them. Taking over-the-counter medicines, vitamins, herbs, and supplements. Your health care provider may recommend that you take pain medicine before the procedure. Ask your health care provider if you should plan to have someone take you home after the procedure. What happens during the procedure?  An instrument called a speculum will be placed in your vagina. This will allow your health care provider to see your cervix. You will be given a medicine to numb the area (local anesthetic). The medicine will be injected into your cervix and the surrounding area. A solution will be applied to your cervix. This solution will help the health care provider find the abnormal cells that need to be removed. A thin wire loop will be passed through your vagina. The wire will be used to burn (cauterize) the cervical tissue with an electrical current. You may feel faint during the procedure.  Tell your health care provider right away if you feel this way. The abnormal cervical tissue will be removed. Any open blood vessels will be cauterized to prevent bleeding. A paste may be applied to the cauterized area of your cervix to help prevent bleeding. The sample of cervical tissue will be examined under a microscope. The procedure may vary among health care providers and hospitals. What can I expect after the procedure? After the procedure, it is common to have: Mild abdominal cramps that are similar to menstrual cramps. These may last for up to 1 week. A small amount of pink-tinged or bloody vaginal discharge, including light to moderate bleeding, for 1-2 weeks. A dark-colored discharge coming from your vagina. This is from the paste that was used on the cervix to prevent bleeding. It is up to you to get the results of your procedure. Ask your health care provider, or the department that is doing the procedure, when your results will be ready. Follow these instructions at home: Take over-the-counter and prescription medicines only as told by your health care provider. Return to your normal activities as told by your health care provider. Ask your health care provider what activities are safe for you. Do not put anything in your vagina for 2 weeks after the procedure or until your health care provider says that it is okay. This includes tampons, creams, and douches. Do not have sex until your health care provider approves. Keep all follow-up visits as told by your health care provider. This is important. Contact a health care provider if you: Have a fever or chills. Feel unusually weak. Have vaginal bleeding that is heavier or longer than a normal menstrual cycle. A sign of this can be soaking a pad with blood or bleeding with clots. Develop a bad smelling vaginal discharge. Have severe abdominal pain or cramping. Summary Loop electrosurgical excision procedure (LEEP) is the removal  of tissue from the cervix. The removed tissue will be checked for precancerous cells or cancer cells. LEEP typically only takes a few minutes and is often done  in the health care provider's office. Do not put anything in your vagina for 2 weeks after the procedure or until your health care provider says that it is okay. This includes tampons, creams, and douches. Keep all follow-up visits as told by your health care provider. Ask your health care provider, or the department that is doing the procedure, when your results will be ready. This information is not intended to replace advice given to you by your health care provider. Make sure you discuss any questions you have with your health care provider. Document Revised: 09/23/2020 Document Reviewed: 07/08/2018 Elsevier Patient Education  2022 [place].   LEEP POST-PROCEDURE INSTRUCTIONS  You may take Ibuprofen, Aleve or Tylenol for pain if needed.  Cramping is normal.  You will have black and/or bloody discharge at first.  This will lighten and then turn clear before completely resolving.  This will take 2 to 3 weeks.  Put nothing in your vagina until the bleeding or discharge stops (usually 2 or3 days).  You need to call if you have redness around the biopsy site, if there is any unusual draining, if the bleeding is heavy, or if you are concerned.  Shower or bathe as normal  We will call you within one week with results or we will discuss the results at your follow-up appointment if needed.  You will need to return for a follow-up Pap smear as directed by your physician. General Anesthesia, Adult, Care After This sheet gives you information about how to care for yourself after your procedure. Your health care provider may also give you more specific instructions. If you have problems or questions, contact your health care provider. What can I expect after the procedure? After the procedure, the following side effects are  common: Pain or discomfort at the IV site. Nausea. Vomiting. Sore throat. Trouble concentrating. Feeling cold or chills. Feeling weak or tired. Sleepiness and fatigue. Soreness and body aches. These side effects can affect parts of the body that were not involved in surgery. Follow these instructions at home: For the time period you were told by your health care provider:  Rest. Do not participate in activities where you could fall or become injured. Do not drive or use machinery. Do not drink alcohol. Do not take sleeping pills or medicines that cause drowsiness. Do not make important decisions or sign legal documents. Do not take care of children on your own. Eating and drinking Follow any instructions from your health care provider about eating or drinking restrictions. When you feel hungry, start by eating small amounts of foods that are soft and easy to digest (bland), such as toast. Gradually return to your regular diet. Drink enough fluid to keep your urine pale yellow. If you vomit, rehydrate by drinking water, juice, or clear broth. General instructions If you have sleep apnea, surgery and certain medicines can increase your risk for breathing problems. Follow instructions from your health care provider about wearing your sleep device: Anytime you are sleeping, including during daytime naps. While taking prescription pain medicines, sleeping medicines, or medicines that make you drowsy. Have a responsible adult stay with you for the time you are told. It is important to have someone help care for you until you are awake and alert. Return to your normal activities as told by your health care provider. Ask your health care provider what activities are safe for you. Take over-the-counter and prescription medicines only as told by your health care provider. If you smoke,  do not smoke without supervision. Keep all follow-up visits as told by your health care provider. This is  important. Contact a health care provider if: You have nausea or vomiting that does not get better with medicine. You cannot eat or drink without vomiting. You have pain that does not get better with medicine. You are unable to pass urine. You develop a skin rash. You have a fever. You have redness around your IV site that gets worse. Get help right away if: You have difficulty breathing. You have chest pain. You have blood in your urine or stool, or you vomit blood. Summary After the procedure, it is common to have a sore throat or nausea. It is also common to feel tired. Have a responsible adult stay with you for the time you are told. It is important to have someone help care for you until you are awake and alert. When you feel hungry, start by eating small amounts of foods that are soft and easy to digest (bland), such as toast. Gradually return to your regular diet. Drink enough fluid to keep your urine pale yellow. Return to your normal activities as told by your health care provider. Ask your health care provider what activities are safe for you. This information is not intended to replace advice given to you by your health care provider. Make sure you discuss any questions you have with your health care provider. Document Revised: 02/29/2020 Document Reviewed: 09/28/2019 Elsevier Patient Education  2022 [employer].

## 2021-05-02 ENCOUNTER — Encounter (HOSPITAL_COMMUNITY)
Admission: RE | Admit: 2021-05-02 | Discharge: 2021-05-02 | Disposition: A | Discharge status: Home / Self Care | Payer: Self-pay | Source: Ambulatory Visit | Attending: Obstetrics & Gynecology | Admitting: Obstetrics & Gynecology

## 2021-05-02 ENCOUNTER — Other Ambulatory Visit: Payer: Self-pay

## 2021-05-02 ENCOUNTER — Encounter (HOSPITAL_COMMUNITY): Payer: Self-pay

## 2021-05-02 VITALS — BP 137/86 | HR 64 | Temp 97.7°F | Resp 18 | Ht 67.0 in | Wt 290.0 lb

## 2021-05-02 DIAGNOSIS — Z01812 Encounter for preprocedural laboratory examination: Secondary | ICD-10-CM | POA: Insufficient documentation

## 2021-05-02 DIAGNOSIS — Z79899 Other long term (current) drug therapy: Secondary | ICD-10-CM | POA: Insufficient documentation

## 2021-05-02 HISTORY — DX: Hypothyroidism, unspecified: E03.9

## 2021-05-02 LAB — BASIC METABOLIC PANEL
Anion gap: 7 (ref 5–15)
BUN: 17 mg/dL (ref 6–20)
CO2: 23 mmol/L (ref 22–32)
Calcium: 9.6 mg/dL (ref 8.9–10.3)
Chloride: 107 mmol/L (ref 98–111)
Creatinine, Ser: 0.69 mg/dL (ref 0.44–1.00)
GFR, Estimated: >60 mL/min (ref >60)
Glucose, Bld: 100 mg/dL — ABNORMAL HIGH (ref 70–99)
Potassium: 3.6 mmol/L (ref 3.5–5.1)
Sodium: 137 mmol/L (ref 135–145)

## 2021-05-02 NOTE — Progress Notes (Signed)
   05/02/21 1406  OBSTRUCTIVE SLEEP APNEA  Have you ever been diagnosed with sleep apnea through a sleep study? No  Do you snore loudly (loud enough to be heard through closed doors)?  1  Do you often feel tired, fatigued, or sleepy during the daytime (such as falling asleep during driving or talking to someone)? 0  Has anyone observed you stop breathing during your sleep? 1  Do you have, or are you being treated for high blood pressure? 1  BMI more than 35 kg/m2? 1  Age > 50 (1-yes) 1  Neck circumference greater than:Female 16 inches or larger, Female 17inches or larger? 0  Female Gender (Yes=1) 0  Obstructive Sleep Apnea Score 5  Score 5 or greater  Results sent to PCP

## 2021-05-02 NOTE — H&P (Signed)
[facility] Obstetrics and Gynecology Attending History and Physical  Barbara Snow is a 55 y.o. 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  In review, recent colposcopy completed 10/24 that showed CIN 2 pathology below.  Prior pap: 02/03/2021- ASCUS, HPV+  She denies vaginal spotting or postcoital bleeding.  Denies any abnormal vaginal discharge, fevers, chills, sweats, dysuria, nausea, vomiting, other GI or GU symptoms or other general symptoms.  Past Medical History:  Diagnosis Date   COPD (chronic obstructive pulmonary disease) ([place])    Depression    Hyperlipidemia    Hypertension    Kidney stone    Stroke ([place]) 10/2017   Past Surgical History:  Procedure Laterality Date   ABDOMINAL HYSTERECTOMY     SUPRACERVICAL   CHOLECYSTECTOMY     TUBAL LIGATION     OB History  Gravida Para Term Preterm AB Living  3         3  SAB IAB Ectopic Multiple Live Births          3    # Outcome Date GA Lbr Len/2nd Weight Sex Delivery Anes PTL Lv  3 Gravida           2 Gravida           1 Gravida           Patient denies any other pertinent gynecologic issues.  No current facility-administered medications on file prior to encounter.   Current Outpatient Medications on File Prior to Encounter  Medication Sig Dispense Refill   acetaminophen (TYLENOL) 325 MG tablet Take 650 mg by mouth every 6 (six) hours as needed for moderate pain.     amLODipine (NORVASC) 10 MG tablet Take 1 tablet (10 mg total) by mouth daily. (Patient taking differently: Take 10 mg by mouth every evening.) 90 tablet 0   aspirin EC 81 MG tablet Take 81 mg by mouth daily. Swallow whole.     atorvastatin (LIPITOR) 40 MG tablet Take 1 tablet (40 mg total) by mouth at bedtime. 90 tablet 0   diphenhydrAMINE (BENADRYL) 25 MG tablet Take 25 mg by mouth at bedtime as needed for allergies.     hydrochlorothiazide (HYDRODIURIL) 25 MG tablet Take 1 tablet (25 mg total) by mouth daily. (Patient taking  differently: Take 25 mg by mouth at bedtime.) 90 tablet 0   levothyroxine (SYNTHROID) 50 MCG tablet Take 1 tablet (50 mcg total) by mouth daily. (Patient taking differently: Take 50 mcg by mouth daily before breakfast.) 90 tablet 0   lisinopril (ZESTRIL) 20 MG tablet Take 1 tablet (20 mg total) by mouth daily. (Patient taking differently: Take 20 mg by mouth every evening.) 90 tablet 0   pantoprazole (PROTONIX) 40 MG tablet Take 1 tablet (40 mg total) by mouth 2 (two) times daily. 60 tablet 0   potassium chloride (KLOR-CON) 10 MEQ tablet Take 1 tablet (10 mEq total) by mouth daily. (Patient taking differently: Take 10 mEq by mouth 2 (two) times daily.) 90 tablet 0   No Known Allergies  Social History:   reports that she quit smoking about 3 years ago. Her smoking use included cigarettes. She has never used smokeless tobacco. She reports current alcohol use. She reports that she does not use drugs. Family History  Problem Relation Age of Onset   Diabetes Mother    Cancer Mother    Heart attack Father    Liver disease Father    Hypertension Daughter    Hypertension  Daughter    Diabetes Sister    Hypertension Son     Review of Systems: Pertinent items noted in HPI and remainder of comprehensive ROS otherwise negative.  PHYSICAL EXAM: Exam in office CONSTITUTIONAL: Well-developed, well-nourished female in no acute distress.  SKIN: Skin is warm and dry. No rash noted. Not diaphoretic. No erythema. No pallor. NEUROLOGIC: Alert and oriented to person, place, and time. Normal reflexes, muscle tone coordination. No cranial nerve deficit noted. PSYCHIATRIC: Normal mood and affect. Normal behavior. Normal judgment and thought content. CARDIOVASCULAR: Normal heart rate noted, regular rhythm RESPIRATORY: Effort and breath sounds normal, no problems with respiration noted ABDOMEN: Soft, nontender, nondistended. PELVIC:  VULVA: normal appearing vulva with no masses, tenderness or lesions, VAGINA:  normal appearing vagina with normal color and discharge, no lesions, cervix with acetowhite changes MUSCULOSKELETAL: no calf tenderness bilaterally EXT: no edema bilaterally, normal pulses  Labs: Results for orders placed or performed in visit on 04/21/21 (from the past 336 hour(s))  Surgical pathology( [place])   Collection Time: 04/21/21 10:46 AM  Result Value Ref Range   SURGICAL PATHOLOGY      SURGICAL PATHOLOGY CASE: [id number] PATIENT: Barbara Snow Surgical Pathology Report     Clinical History: ASCUS with positive high risk HPV cervical (nt)     FINAL MICROSCOPIC DIAGNOSIS:  A. CERVIX, 12 O'CLOCK, BIOPSY: - Low-grade squamous intraepithelial lesion (CIN1, low grade dysplasia), see comment  B. ENDOCERVICAL, CURETTAGE: - High-grade squamous intraepithelial lesion (CIN2, low grade dysplasia), see comment     COMMENT:  A.  Immunostain for p16 is negative and supports the above interpretation.  B.  Immunostain for p16 shows scant, small fragments of squamous mucosa with apparently full-thickness staining, consistent with high-grade dysplasia.       GROSS DESCRIPTION:  A: The specimen is received in formalin and consists of a 0.6 cm aggregate of tan mucus.  The specimen is entirely submitted in 1 cassette.  B: The specimen is received in formalin on a wire brush and consists of a 1.0 x 1.0 x 0.3 cm aggregate of tan soft tissue an d mucus.  The specimen is entirely submitted in 1 cassette.  Craig Staggers 04/21/2021)    Final Diagnosis performed by Jaquita Folds, MD.   Electronically signed 04/23/2021 Technical component performed at [facility]. [facility], [place] [address] [place], [place] [address].  Professional component performed at [facility], [place] [address] [place], [place] [address].  Immunohistochemistry Technical component (if applicable) was performed at [facility]. [address] [place], [place], [place] [address].   IMMUNOHISTOCHEMISTRY DISCLAIMER (if applicable): Some of these immunohistochemical stains may have been developed and the performance characteristics determine by [facility]. Some may not have been cleared or approved by the U.S. Food and Drug Administration. The FDA has determined that such clearance or approval is not necessary. This test is used for clinical purposes. It should not be regarded as investigational  or for research. This laboratory is certified under the [place] (CLIA-88) as qualified to perform high complexity clinical laboratory testing.  The controls stained appropriately.     Assessment: Active Problems:   Dysplasia of cervix, high grade CIN 2   Plan: -Plan to proceed with LEEP -NPO -LR @ 125cc/hr -SCDs to OR -Risk/benefits and alternatives reviewed with the patient including but not limited to risk of bleeding, infection and injury such as cervical shortening. Questions and concerns were addressed and pt  desires to proceed  Janyth Pupa, DO Attending [place], [facility] for [facility], [place]

## 2021-05-06 ENCOUNTER — Ambulatory Visit (HOSPITAL_COMMUNITY): Payer: Self-pay | Admitting: Anesthesiology

## 2021-05-06 ENCOUNTER — Encounter (HOSPITAL_COMMUNITY): Payer: Self-pay | Admitting: Obstetrics & Gynecology

## 2021-05-06 ENCOUNTER — Ambulatory Visit: Payer: Self-pay | Admitting: Physician Assistant

## 2021-05-06 ENCOUNTER — Encounter (HOSPITAL_COMMUNITY)
Admission: RE | Disposition: A | Discharge status: Home / Self Care | Payer: Self-pay | Source: Home / Self Care | Attending: Obstetrics & Gynecology

## 2021-05-06 ENCOUNTER — Ambulatory Visit (HOSPITAL_COMMUNITY)
Admission: RE | Admit: 2021-05-06 | Discharge: 2021-05-06 | Disposition: A | Discharge status: Home / Self Care | Payer: Self-pay | Attending: Obstetrics & Gynecology | Admitting: Obstetrics & Gynecology

## 2021-05-06 DIAGNOSIS — Z6841 Body Mass Index (BMI) 40.0 and over, adult: Secondary | ICD-10-CM | POA: Insufficient documentation

## 2021-05-06 DIAGNOSIS — N871 Moderate cervical dysplasia: Secondary | ICD-10-CM

## 2021-05-06 DIAGNOSIS — Z87891 Personal history of nicotine dependence: Secondary | ICD-10-CM | POA: Insufficient documentation

## 2021-05-06 HISTORY — DX: Other complications of anesthesia, initial encounter: T88.59XA

## 2021-05-06 HISTORY — PX: LEEP: SHX91

## 2021-05-06 HISTORY — DX: Nausea with vomiting, unspecified: R11.2

## 2021-05-06 HISTORY — DX: Other specified postprocedural states: Z98.890

## 2021-05-06 SURGERY — LEEP (LOOP ELECTROSURGICAL EXCISION PROCEDURE)
Anesthesia: General | Site: Vagina

## 2021-05-06 MED ORDER — MIDAZOLAM HCL 2 MG/2ML IJ SOLN
INTRAMUSCULAR | Status: AC
  Administered 2021-05-06: 1 mg via INTRAVENOUS
  Filled 2021-05-06: qty 2

## 2021-05-06 MED ORDER — MIDAZOLAM HCL 2 MG/2ML IJ SOLN
INTRAMUSCULAR | Status: AC
  Filled 2021-05-06: qty 2

## 2021-05-06 MED ORDER — FERRIC SUBSULFATE SOLN
Status: DC | PRN
  Administered 2021-05-06: 1

## 2021-05-06 MED ORDER — LIDOCAINE-EPINEPHRINE 0.5 %-1:200000 IJ SOLN
INTRAMUSCULAR | Status: DC | PRN
  Administered 2021-05-06: 6 mL

## 2021-05-06 MED ORDER — PROPOFOL 10 MG/ML IV BOLUS
INTRAVENOUS | Status: AC
  Filled 2021-05-06: qty 20

## 2021-05-06 MED ORDER — FENTANYL CITRATE (PF) 100 MCG/2ML IJ SOLN
INTRAMUSCULAR | Status: AC
  Filled 2021-05-06: qty 2

## 2021-05-06 MED ORDER — KETOROLAC TROMETHAMINE 30 MG/ML IJ SOLN
INTRAMUSCULAR | Status: AC
  Filled 2021-05-06: qty 1

## 2021-05-06 MED ORDER — LIDOCAINE HCL (PF) 2 % IJ SOLN
INTRAMUSCULAR | Status: AC
  Filled 2021-05-06: qty 5

## 2021-05-06 MED ORDER — ONDANSETRON HCL 4 MG/2ML IJ SOLN
INTRAMUSCULAR | Status: AC
  Filled 2021-05-06: qty 2

## 2021-05-06 MED ORDER — DEXAMETHASONE SODIUM PHOSPHATE 10 MG/ML IJ SOLN
INTRAMUSCULAR | Status: AC
  Filled 2021-05-06: qty 1

## 2021-05-06 MED ORDER — ORAL CARE MOUTH RINSE: 15.0000 mL | Freq: Once | OROMUCOSAL | Status: AC

## 2021-05-06 MED ORDER — MIDAZOLAM HCL 5 MG/5ML IJ SOLN
INTRAMUSCULAR | Status: DC | PRN
Start: 2021-05-06 — End: 2021-05-06
  Administered 2021-05-06: 2 mg via INTRAVENOUS

## 2021-05-06 MED ORDER — KETOROLAC TROMETHAMINE 30 MG/ML IJ SOLN
INTRAMUSCULAR | Status: DC | PRN
Start: 2021-05-06 — End: 2021-05-06
  Administered 2021-05-06: 30 mg via INTRAVENOUS

## 2021-05-06 MED ORDER — FENTANYL CITRATE (PF) 100 MCG/2ML IJ SOLN
INTRAMUSCULAR | Status: DC | PRN
  Administered 2021-05-06: 100 ug via INTRAVENOUS

## 2021-05-06 MED ORDER — MIDAZOLAM HCL 2 MG/2ML IJ SOLN
1.0000 mg | INTRAMUSCULAR | Status: AC | PRN
  Administered 2021-05-06: 1 mg via INTRAVENOUS

## 2021-05-06 MED ORDER — LIDOCAINE-EPINEPHRINE 0.5 %-1:200000 IJ SOLN
INTRAMUSCULAR | Status: AC
  Filled 2021-05-06: qty 1

## 2021-05-06 MED ORDER — ONDANSETRON HCL 4 MG/2ML IJ SOLN
INTRAMUSCULAR | Status: DC | PRN
  Administered 2021-05-06: 4 mg via INTRAVENOUS

## 2021-05-06 MED ORDER — LACTATED RINGERS IV SOLN: INTRAVENOUS | Status: DC

## 2021-05-06 MED ORDER — FERRIC SUBSULFATE 259 MG/GM EX SOLN
CUTANEOUS | Status: AC
  Filled 2021-05-06: qty 8

## 2021-05-06 MED ORDER — DEXAMETHASONE SODIUM PHOSPHATE 10 MG/ML IJ SOLN
INTRAMUSCULAR | Status: DC | PRN
  Administered 2021-05-06: 10 mg via INTRAVENOUS

## 2021-05-06 MED ORDER — SUGAMMADEX SODIUM 500 MG/5ML IV SOLN
INTRAVENOUS | Status: AC
  Filled 2021-05-06: qty 5

## 2021-05-06 MED ORDER — PROPOFOL 10 MG/ML IV BOLUS
INTRAVENOUS | Status: DC | PRN
  Administered 2021-05-06: 200 mg via INTRAVENOUS

## 2021-05-06 MED ORDER — CHLORHEXIDINE GLUCONATE 0.12 % MT SOLN
15.0000 mL | Freq: Once | OROMUCOSAL | Status: AC
  Administered 2021-05-06: 15 mL via OROMUCOSAL

## 2021-05-06 SURGICAL SUPPLY — 22 items
ELECT LOOP LEEP RND 10X10 YLW (CUTTING LOOP) ×3
ELECT REM PT RETURN 9FT ADLT (ELECTROSURGICAL) ×3
ELECTRODE LOOP LP RND 10X10YLW (CUTTING LOOP) IMPLANT
ELECTRODE REM PT RTRN 9FT ADLT (ELECTROSURGICAL) ×1 IMPLANT
GAUZE 4X4 16PLY ~~LOC~~+RFID DBL (SPONGE) ×6 IMPLANT
GLOVE SURG LTX SZ6.5 (GLOVE) ×3 IMPLANT
GLOVE SURG UNDER POLY LF SZ6.5 (GLOVE) ×3 IMPLANT
GLOVE SURG UNDER POLY LF SZ7 (GLOVE) ×9 IMPLANT
GOWN STRL REUS W/ TWL LRG LVL3 (GOWN DISPOSABLE) ×1 IMPLANT
GOWN STRL REUS W/TWL LRG LVL3 (GOWN DISPOSABLE) ×6 IMPLANT
NDL HYPO 18GX1.5 BLUNT FILL (NEEDLE) ×1 IMPLANT
NEEDLE HYPO 18GX1.5 BLUNT FILL (NEEDLE) ×3 IMPLANT
NS IRRIG 1000ML POUR BTL (IV SOLUTION) ×3 IMPLANT
PACK VAGINAL MINOR WOMEN LF (CUSTOM PROCEDURE TRAY) ×3 IMPLANT
PAD OB MATERNITY 4.3X12.25 (PERSONAL CARE ITEMS) ×3 IMPLANT
PENCIL SMOKE EVACUATOR (MISCELLANEOUS) ×3 IMPLANT
SCOPETTES 8  STERILE (MISCELLANEOUS) ×3
SCOPETTES 8 STERILE (MISCELLANEOUS) ×1 IMPLANT
SET TUBE IRRIG SUCTION NO TIP (IRRIGATION / IRRIGATOR) ×3 IMPLANT
SUT SILK 2 0 SH (SUTURE) ×2 IMPLANT
SYR 30ML LL (SYRINGE) ×3 IMPLANT
TOWEL OR 17X26 4PK STRL BLUE (TOWEL DISPOSABLE) ×3 IMPLANT

## 2021-05-06 NOTE — Transfer of Care (Signed)
Immediate Anesthesia Transfer of Care Note  Patient: Barbara Snow  Procedure(s) Performed: LOOP ELECTROSURGICAL EXCISION PROCEDURE (LEEP) (Vagina )  Patient Location: PACU  Anesthesia Type:General  Level of Consciousness: awake, alert , oriented and patient cooperative  Airway & Oxygen Therapy: Patient Spontanous Breathing  Post-op Assessment: Report given to RN, Post -op Vital signs reviewed and stable and Patient moving all extremities X 4  Post vital signs: Reviewed and stable  Last Vitals:  Vitals Value Taken Time  BP 106/61 05/06/21 1204  Temp    Pulse 76 05/06/21 1206  Resp 11 05/06/21 1206  SpO2 93 % 05/06/21 1206  Vitals shown include unvalidated device data.  Last Pain:  Vitals:   05/06/21 0956  TempSrc: Oral  PainSc: 0-No pain      Patients Stated Pain Goal: 4 (14/43/60 1658)  Complications: No notable events documented.

## 2021-05-06 NOTE — Interval H&P Note (Signed)
History and Physical Interval Note:  05/06/2021 10:51 AM  Barbara Snow  has presented today for surgery, with the diagnosis of CIN 2.  The various methods of treatment have been discussed with the patient and family. After consideration of risks, benefits and other options for treatment, the patient has consented to  Procedure(s): LOOP ELECTROSURGICAL EXCISION PROCEDURE (LEEP) (N/A) as a surgical intervention.  The patient's history has been reviewed, patient examined, no change in status, stable for surgery.  I have reviewed the patient's chart and labs.  Questions were answered to the patient's satisfaction.     Annalee Genta

## 2021-05-06 NOTE — Anesthesia Procedure Notes (Signed)
Procedure Name: Intubation Date/Time: 05/06/2021 11:31 AM Performed by: Jonna Munro, CRNA Pre-anesthesia Checklist: Patient identified, Emergency Drugs available, Suction available, Patient being monitored and Timeout performed Patient Re-evaluated:Patient Re-evaluated prior to induction Oxygen Delivery Method: Circle system utilized Preoxygenation: Pre-oxygenation with 100% oxygen Induction Type: IV induction Ventilation: Mask ventilation without difficulty Laryngoscope Size: Mac and 3 Grade View: Grade I Tube type: Oral Number of attempts: 1 Airway Equipment and Method: Stylet Placement Confirmation: ETT inserted through vocal cords under direct vision and positive ETCO2 Secured at: 23 cm Tube secured with: Tape Dental Injury: Teeth and Oropharynx as per pre-operative assessment

## 2021-05-06 NOTE — Op Note (Signed)
OPERATIVE NOTE NOTE  DIAGNOSIS: CIN 2 Postop DIAGNOSIS: CIN 2 Procedure: LEEP Complications: none  EBL: minimal  Pap ASCU, HPV+ Colpo Biopsy CIN 1 ECC CIN 2  Risks, benefits, alternatives, and limitations of procedure explained to patient, including pain, bleeding, infection, failure to remove abnormal tissue and failure to cure dysplasia, need for repeat procedures, damage to pelvic organs, cervical incompetence.  Role of HPV,cervical dysplasia and need for close followup was empasized. Informed written consent was obtained. All questions were answered. Time out performed. Urine pregnancy test was negative.  ??Procedure: The patient was placed in lithotomy position and the bivalved coated speculum was placed in the patient's vagina. A grounding pad placed on the patient. Lugol's solution was applied to the cervix and areas of decreased uptake were noted around the transformation zone.   Local anesthesia was administered via an intracervical block using 10 ml of 2% Lidocaine with epinephrine. The suction was turned on and the Extra Small 1X Fisher Cone Biopsy Excisor on 61 Watts of blended current was used to excise the area of decreased uptake and excise the entire transformation zone. An inner margin was also obtained in a similar fashion.  Excellent hemostasis was achieved using roller ball coagulation set at 60 Watts coagulation current. Monsel's solution was then applied and the speculum was removed from the vagina. Specimens were sent to pathology.  ?The patient tolerated the procedure well and was taken to recovery in stable condition.  Janyth Pupa, DO Attending [place], [facility] for [employer], [place]

## 2021-05-06 NOTE — Anesthesia Preprocedure Evaluation (Addendum)
Anesthesia Evaluation  Patient identified by MRN, date of birth, ID band Patient awake    Reviewed: Allergy & Precautions, NPO status , Patient's Chart, lab work & pertinent test results  History of Anesthesia Complications (+) PONV and history of anesthetic complications  Airway Mallampati: II  TM Distance: >3 FB Neck ROM: Full    Dental  (+) Dental Advisory Given, Chipped, Poor Dentition Bilateral Chipped/broken teethx4 on bottom:   Pulmonary sleep apnea (As per her boy freind, she stops breathing during night time) , COPD,  COPD inhaler, former smoker,    Pulmonary exam normal breath sounds clear to auscultation       Cardiovascular Exercise Tolerance: Good hypertension, Pt. on medications Normal cardiovascular exam Rhythm:Regular Rate:Normal     Neuro/Psych PSYCHIATRIC DISORDERS Depression TIACVA, Residual Symptoms    GI/Hepatic Neg liver ROS, GERD  Medicated and Poorly Controlled,  Endo/Other  Hypothyroidism Morbid obesity  Renal/GU Renal disease  negative genitourinary   Musculoskeletal negative musculoskeletal ROS (+)   Abdominal   Peds negative pediatric ROS (+)  Hematology negative hematology ROS (+)   Anesthesia Other Findings   Reproductive/Obstetrics negative OB ROS                            Anesthesia Physical Anesthesia Plan  ASA: 3  Anesthesia Plan: General   Post-op Pain Management:    Induction: Intravenous  PONV Risk Score and Plan: 4 or greater and Ondansetron and Dexamethasone  Airway Management Planned: Oral ETT  Additional Equipment:   Intra-op Plan:   Post-operative Plan: Extubation in OR  Informed Consent: I have reviewed the patients History and Physical, chart, labs and discussed the procedure including the risks, benefits and alternatives for the proposed anesthesia with the patient or authorized representative who has indicated his/her  understanding and acceptance.     Dental advisory given  Plan Discussed with: CRNA and Surgeon  Anesthesia Plan Comments:        Anesthesia Quick Evaluation

## 2021-05-06 NOTE — Discharge Instructions (Signed)
HOME INSTRUCTIONS Please note any unusual or excessive bleeding, pain, swelling. Mild dizziness or drowsiness are normal for about 24 hours after surgery.  As discussed, some yellow, gray, brown and even black discharge is normal.   Shower when comfortable  Restrictions: No driving for 24 hours or while taking pain medications.  Activity:  No heavy lifting (> 10 lbs) for one week.  Nothing in vagina (no tampons, douching, or intercourse) x 4 weeks; no tub baths for 4 weeks Vaginal spotting is expected but if your bleeding is heavy, period like,  please call the office  Diet:  You may return to your regular diet.  Do not eat large meals.  Eat small frequent meals throughout the day.  Continue to drink a good amount of water at least 6-8 glasses of water per day, hydration is very important for the healing process.  Pain Management: Take Motrin and/or Tylenol as needed for pain.  Alcohol -- Avoid for 24 hours and while taking pain medications.  Nausea: Take sips of ginger ale or soda  Fever -- Call physician if temperature over 101 degrees  Follow up:  If you experience fever (a temperature greater than 100.4), pain unrelieved by pain medication, shortness of breath, swelling of a single leg, or any other symptoms which are concerning to you please the office immediately.

## 2021-05-06 NOTE — Anesthesia Postprocedure Evaluation (Signed)
Anesthesia Post Note  Patient: Barbara Snow  Procedure(s) Performed: LOOP ELECTROSURGICAL EXCISION PROCEDURE (LEEP) (Vagina )  Patient location during evaluation: Phase II Anesthesia Type: General Level of consciousness: awake and alert and oriented Pain management: pain level controlled Vital Signs Assessment: post-procedure vital signs reviewed and stable Respiratory status: spontaneous breathing, nonlabored ventilation and respiratory function stable Cardiovascular status: blood pressure returned to baseline and stable Postop Assessment: no apparent nausea or vomiting Anesthetic complications: no   No notable events documented.   Last Vitals:  Vitals:   05/06/21 1215 05/06/21 1238  BP: 104/73 129/73  Pulse: 71 (!) 58  Resp: 13 15  Temp:  36.9 C  SpO2: 92% 94%    Last Pain:  Vitals:   05/06/21 1243  TempSrc:   PainSc: 6                  Dorismar Chay C Alvia Tory

## 2021-05-07 ENCOUNTER — Encounter (HOSPITAL_COMMUNITY): Payer: Self-pay | Admitting: Obstetrics & Gynecology

## 2021-05-08 LAB — SURGICAL PATHOLOGY

## 2021-05-16 ENCOUNTER — Encounter: Payer: Self-pay | Admitting: Obstetrics & Gynecology

## 2021-05-16 ENCOUNTER — Other Ambulatory Visit: Payer: Self-pay

## 2021-05-16 ENCOUNTER — Ambulatory Visit (INDEPENDENT_AMBULATORY_CARE_PROVIDER_SITE_OTHER): Payer: Self-pay | Admitting: Obstetrics & Gynecology

## 2021-05-16 VITALS — BP 143/87 | HR 68 | Ht 66.0 in | Wt 288.4 lb

## 2021-05-16 DIAGNOSIS — N870 Mild cervical dysplasia: Secondary | ICD-10-CM

## 2021-05-16 DIAGNOSIS — Z9889 Other specified postprocedural states: Secondary | ICD-10-CM

## 2021-05-16 NOTE — Progress Notes (Signed)
    POSTOP VISIT  Barbara Snow is a 55 y.o. G14P0 female who presents for a postoperative visit. She is 1 week postop following LEEP on 05/06/2021  Today she notes that she does have some cramping- taking Tylenol.  Bleeding/black discharge improving, but still not quite gone.  Only light spotting. Denies fever or chills.  Tolerating gen diet.  +Flatus, Regular BMs.   She has also noted some SOB, at work she was notes that she was having difficulty and they sent her home.  Denies SOB at rest, no chest pain.  Pathology reviewed: CIN 1, +margin noted from 3-6, no evidence of malignancy  Review of Systems Pertinent items are noted in HPI.    Objective:  BP (!) 143/87 (BP Location: Right Arm, Patient Position: Sitting, Cuff Size: Large)   Pulse 68   Ht 5\' 6"  (1.676 m)   Wt 288 lb 6.4 oz (130.8 kg)   BMI 46.55 kg/m    Physical Examination:  GENERAL ASSESSMENT: well developed and well nourished SKIN: warm and dry CHEST: normal air exchange, respiratory effort normal with no retractions, CTAB HEART: regular rate and rhythm GU: normal external genitalia, vaginal mucosa, pink moist, no abnormal discharge, cervix healing appropriately EXTREMITY: 1+ edema PSYCH: mood appropriate, normal affect       Assessment:    Postop- LEEP   Plan:   -healing appropriately -continue pelvic rest x 3 more weeks -may return to work -SOB does not seem related to surgery- advised f/u with PCP.  Should she note continued/worsening of symptoms or other acute changes go to ER/Urgent Care  Janyth Pupa, DO Attending [place], [place] for [facility], [place]

## 2021-05-26 ENCOUNTER — Other Ambulatory Visit: Payer: Self-pay | Admitting: Physician Assistant

## 2021-05-27 ENCOUNTER — Encounter: Payer: Self-pay | Admitting: Physician Assistant

## 2021-05-27 ENCOUNTER — Other Ambulatory Visit: Payer: Self-pay | Admitting: Physician Assistant

## 2021-05-27 ENCOUNTER — Other Ambulatory Visit (HOSPITAL_COMMUNITY)
Admission: RE | Admit: 2021-05-27 | Discharge: 2021-05-27 | Disposition: A | Discharge status: Home / Self Care | Payer: Self-pay | Source: Ambulatory Visit | Attending: Physician Assistant | Admitting: Physician Assistant

## 2021-05-27 ENCOUNTER — Other Ambulatory Visit: Payer: Self-pay

## 2021-05-27 ENCOUNTER — Ambulatory Visit: Payer: Self-pay | Admitting: Physician Assistant

## 2021-05-27 VITALS — BP 129/71 | HR 68 | Temp 97.0°F | Wt 292.0 lb

## 2021-05-27 DIAGNOSIS — M25561 Pain in right knee: Secondary | ICD-10-CM

## 2021-05-27 DIAGNOSIS — E785 Hyperlipidemia, unspecified: Secondary | ICD-10-CM | POA: Insufficient documentation

## 2021-05-27 DIAGNOSIS — E039 Hypothyroidism, unspecified: Secondary | ICD-10-CM | POA: Insufficient documentation

## 2021-05-27 DIAGNOSIS — I10 Essential (primary) hypertension: Secondary | ICD-10-CM

## 2021-05-27 LAB — COMPREHENSIVE METABOLIC PANEL
ALT: 25 U/L (ref 0–44)
AST: 20 U/L (ref 15–41)
Albumin: 4.1 g/dL (ref 3.5–5.0)
Alkaline Phosphatase: 109 U/L (ref 38–126)
Anion gap: 8 (ref 5–15)
BUN: 16 mg/dL (ref 6–20)
CO2: 26 mmol/L (ref 22–32)
Calcium: 9.8 mg/dL (ref 8.9–10.3)
Chloride: 103 mmol/L (ref 98–111)
Creatinine, Ser: 0.73 mg/dL (ref 0.44–1.00)
GFR, Estimated: >60 mL/min (ref >60)
Glucose, Bld: 107 mg/dL — ABNORMAL HIGH (ref 70–99)
Potassium: 4.3 mmol/L (ref 3.5–5.1)
Sodium: 137 mmol/L (ref 135–145)
Total Bilirubin: 0.6 mg/dL (ref 0.3–1.2)
Total Protein: 8.2 g/dL — ABNORMAL HIGH (ref 6.5–8.1)

## 2021-05-27 LAB — LIPID PANEL
Cholesterol: 144 mg/dL (ref 0–200)
HDL: 56 mg/dL (ref >40)
LDL Cholesterol: 68 mg/dL (ref 0–99)
Total CHOL/HDL Ratio: 2.6 RATIO
Triglycerides: 100 mg/dL (ref <150)
VLDL: 20 mg/dL (ref 0–40)

## 2021-05-27 LAB — TSH: TSH: 3.032 u[IU]/mL (ref 0.350–4.500)

## 2021-05-27 NOTE — Progress Notes (Signed)
BP 129/71   Pulse 68   Temp (!) 97 F (36.1 C)   Wt 292 lb (132.5 kg)   SpO2 97%   BMI 47.13 kg/m    Subjective:    Patient ID: Barbara Snow, female    DOB: 01/05/1966, 55 y.o.   MRN: [id number]  HPI: Barbara Snow is a 55 y.o. female presenting on 05/27/2021 for Hyperlipidemia and Hypertension   HPI   Chief Complaint  Patient presents with   Hyperlipidemia   Hypertension    Pt  c/o R knee pain x 4-5 days and no injury.  APAP didn't help the pain.   She Doesn't use nsaids due to GI issues  She had Recent LEEP procedure  Her FIT test for colon cancer screening has been in her purse for about a month    Relevant past medical, surgical, family and social history reviewed and updated as indicated. Interim medical history since our last visit reviewed. Allergies and medications reviewed and updated.   Current Outpatient Medications:    amLODipine (NORVASC) 10 MG tablet, Take 1 tablet (10 mg total) by mouth daily. (Patient taking differently: Take 10 mg by mouth every evening.), Disp: 90 tablet, Rfl: 0   aspirin EC 81 MG tablet, Take 81 mg by mouth daily. Swallow whole., Disp: , Rfl:    atorvastatin (LIPITOR) 40 MG tablet, Take 1 tablet (40 mg total) by mouth at bedtime., Disp: 90 tablet, Rfl: 0   hydrochlorothiazide (HYDRODIURIL) 25 MG tablet, Take 1 tablet (25 mg total) by mouth daily. (Patient taking differently: Take 25 mg by mouth at bedtime.), Disp: 90 tablet, Rfl: 0   levothyroxine (SYNTHROID) 50 MCG tablet, Take 1 tablet (50 mcg total) by mouth daily. (Patient taking differently: Take 50 mcg by mouth daily before breakfast.), Disp: 90 tablet, Rfl: 0   lisinopril (ZESTRIL) 20 MG tablet, Take 1 tablet (20 mg total) by mouth daily. (Patient taking differently: Take 20 mg by mouth every evening.), Disp: 90 tablet, Rfl: 0   pantoprazole (PROTONIX) 40 MG tablet, Take 1 tablet (40 mg total) by mouth 2 (two) times daily., Disp: 60 tablet, Rfl: 0   potassium chloride  (KLOR-CON) 10 MEQ tablet, Take 1 tablet (10 mEq total) by mouth daily. (Patient taking differently: Take 10 mEq by mouth 2 (two) times daily.), Disp: 90 tablet, Rfl: 0   acetaminophen (TYLENOL) 325 MG tablet, Take 650 mg by mouth every 6 (six) hours as needed for moderate pain. (Patient not taking: Reported on 05/27/2021), Disp: , Rfl:    diphenhydrAMINE (BENADRYL) 25 MG tablet, Take 25 mg by mouth at bedtime as needed for allergies. (Patient not taking: Reported on 05/27/2021), Disp: , Rfl:      Review of Systems  Per HPI unless specifically indicated above     Objective:    BP 129/71   Pulse 68   Temp (!) 97 F (36.1 C)   Wt 292 lb (132.5 kg)   SpO2 97%   BMI 47.13 kg/m   Wt Readings from Last 3 Encounters:  05/27/21 292 lb (132.5 kg)  05/16/21 288 lb 6.4 oz (130.8 kg)  05/02/21 290 lb (131.5 kg)     07/28/18- 276 lb   Physical Exam Vitals reviewed.  Constitutional:      General: She is not in acute distress.    Appearance: She is well-developed. She is obese. She is not toxic-appearing.  HENT:     Head: Normocephalic and atraumatic.  Cardiovascular:  Rate and Rhythm: Normal rate and regular rhythm.  Pulmonary:     Effort: Pulmonary effort is normal.     Breath sounds: Normal breath sounds.  Abdominal:     General: Bowel sounds are normal.     Palpations: Abdomen is soft. There is no mass.     Tenderness: There is no abdominal tenderness.  Musculoskeletal:     Cervical back: Neck supple.     Right knee: No swelling, erythema or bony tenderness. Normal range of motion. Tenderness present. No LCL laxity, MCL laxity, ACL laxity or PCL laxity.     Right lower leg: No edema.     Left lower leg: No edema.     Comments: Mild diffuse tenderness  Lymphadenopathy:     Cervical: No cervical adenopathy.  Skin:    General: Skin is warm and dry.  Neurological:     Mental Status: She is alert and oriented to person, place, and time.  Psychiatric:        Attention and  Perception: Attention normal.        Speech: Speech normal.        Behavior: Behavior normal. Behavior is cooperative.      Results for orders placed or performed during the hospital encounter of 05/27/21  Lipid panel  Result Value Ref Range   Cholesterol 144 0 - 200 mg/dL   Triglycerides 100 <150 mg/dL   HDL 56 >40 mg/dL   Total CHOL/HDL Ratio 2.6 RATIO   VLDL 20 0 - 40 mg/dL   LDL Cholesterol 68 0 - 99 mg/dL  Comprehensive metabolic panel  Result Value Ref Range   Sodium 137 135 - 145 mmol/L   Potassium 4.3 3.5 - 5.1 mmol/L   Chloride 103 98 - 111 mmol/L   CO2 26 22 - 32 mmol/L   Glucose, Bld 107 (H) 70 - 99 mg/dL   BUN 16 6 - 20 mg/dL   Creatinine, Ser 0.73 0.44 - 1.00 mg/dL   Calcium 9.8 8.9 - 10.3 mg/dL   Total Protein 8.2 (H) 6.5 - 8.1 g/dL   Albumin 4.1 3.5 - 5.0 g/dL   AST 20 15 - 41 U/L   ALT 25 0 - 44 U/L   Alkaline Phosphatase 109 38 - 126 U/L   Total Bilirubin 0.6 0.3 - 1.2 mg/dL   GFR, Estimated >60 >60 mL/min   Anion gap 8 5 - 15  TSH  Result Value Ref Range   TSH 3.032 0.350 - 4.500 uIU/mL      Assessment & Plan:    Encounter Diagnoses  Name Primary?   Essential hypertension Yes   Hypothyroidism, unspecified type    Hyperlipidemia, unspecified hyperlipidemia type    Right knee pain, unspecified chronicity    Morbid obesity ([place])      -Mammogram utd - due aug 2023 -Pt given another FIT test for colon cancer screening  -R knee- pt to apply Ice 10-20 minutes 3 or 4 times daily, use ace wrap.  Xray knee is ordered.   She says she has cafa.  Pt was Counseled on long term impact her weight increases the strain on her knees.  encouraged weight loss through healthy diet. -reviewed labs with pt  -pt to continue current medications -pt to follow up 3 months.  She is to contact office sooner prn

## 2021-05-30 ENCOUNTER — Other Ambulatory Visit: Payer: Self-pay | Admitting: Physician Assistant

## 2021-05-30 ENCOUNTER — Ambulatory Visit (HOSPITAL_COMMUNITY)
Admission: RE | Admit: 2021-05-30 | Discharge: 2021-05-30 | Disposition: A | Discharge status: Home / Self Care | Payer: Self-pay | Source: Ambulatory Visit | Attending: Physician Assistant | Admitting: Physician Assistant

## 2021-05-30 ENCOUNTER — Other Ambulatory Visit: Payer: Self-pay

## 2021-05-30 DIAGNOSIS — M25561 Pain in right knee: Secondary | ICD-10-CM | POA: Insufficient documentation

## 2021-05-30 MED ORDER — ATORVASTATIN CALCIUM 40 MG PO TABS: ORAL_TABLET | ORAL | 0 refills | Status: DC

## 2021-05-30 MED ORDER — HYDROCHLOROTHIAZIDE 25 MG PO TABS: 25.0000 mg | ORAL_TABLET | Freq: Every day | ORAL | 0 refills | Status: DC

## 2021-05-30 MED ORDER — AMLODIPINE BESYLATE 10 MG PO TABS: 10.0000 mg | ORAL_TABLET | Freq: Every day | ORAL | 0 refills | Status: DC

## 2021-05-30 MED ORDER — LISINOPRIL 20 MG PO TABS: 20.0000 mg | ORAL_TABLET | Freq: Every day | ORAL | 0 refills | Status: DC

## 2021-05-30 MED ORDER — POTASSIUM CHLORIDE ER 10 MEQ PO CPCR: 10.0000 meq | ORAL_CAPSULE | Freq: Every day | ORAL | 0 refills | Status: DC

## 2021-06-24 ENCOUNTER — Other Ambulatory Visit: Payer: Self-pay | Admitting: Physician Assistant

## 2021-06-25 ENCOUNTER — Other Ambulatory Visit: Payer: Self-pay | Admitting: Physician Assistant

## 2021-06-25 MED ORDER — LEVOTHYROXINE SODIUM 50 MCG PO TABS: 50.0000 ug | ORAL_TABLET | Freq: Every day | ORAL | 3 refills | Status: DC

## 2021-07-10 ENCOUNTER — Telehealth: Payer: Self-pay | Admitting: Physician Assistant

## 2021-07-10 DIAGNOSIS — E876 Hypokalemia: Secondary | ICD-10-CM

## 2021-07-10 DIAGNOSIS — I10 Essential (primary) hypertension: Secondary | ICD-10-CM

## 2021-07-10 NOTE — Telephone Encounter (Signed)
Pt calls to ask how is she supposed to be taking her K+ pills.  She says she has been taking them bid for months.  Epic records state she is taking them qd.  Pt says she is having a bit of a HA.  She is instructed to get labs drawn to check the K+.  Also discussed other causes of HA including regular HA, needing sleep, stress, covid.  Pt says she will go to lab in the morning (she is at work now).  She is instructed to take K+ tablets qd.

## 2021-07-21 ENCOUNTER — Other Ambulatory Visit: Payer: Self-pay | Admitting: Physician Assistant

## 2021-07-30 ENCOUNTER — Telehealth: Payer: Self-pay

## 2021-07-30 NOTE — Telephone Encounter (Signed)
Attempted to contact patient regarding [facility] Medicaid application. Left message on identifying voicemail requesting a return call.  

## 2021-08-05 ENCOUNTER — Other Ambulatory Visit: Payer: Self-pay | Admitting: Physician Assistant

## 2021-08-05 MED ORDER — POTASSIUM CHLORIDE ER 10 MEQ PO CPCR: 10.0000 meq | ORAL_CAPSULE | Freq: Every day | ORAL | 0 refills | Status: DC

## 2021-08-13 ENCOUNTER — Other Ambulatory Visit: Payer: Self-pay | Admitting: Physician Assistant

## 2021-08-13 DIAGNOSIS — I10 Essential (primary) hypertension: Secondary | ICD-10-CM

## 2021-08-13 DIAGNOSIS — R7309 Other abnormal glucose: Secondary | ICD-10-CM

## 2021-08-13 DIAGNOSIS — Z131 Encounter for screening for diabetes mellitus: Secondary | ICD-10-CM

## 2021-08-13 DIAGNOSIS — E039 Hypothyroidism, unspecified: Secondary | ICD-10-CM

## 2021-08-13 DIAGNOSIS — E785 Hyperlipidemia, unspecified: Secondary | ICD-10-CM

## 2021-08-18 ENCOUNTER — Other Ambulatory Visit: Payer: Self-pay | Admitting: Physician Assistant

## 2021-08-25 ENCOUNTER — Other Ambulatory Visit (HOSPITAL_COMMUNITY)
Admission: RE | Admit: 2021-08-25 | Discharge: 2021-08-25 | Disposition: A | Discharge status: Home / Self Care | Payer: Self-pay | Source: Ambulatory Visit | Attending: Physician Assistant | Admitting: Physician Assistant

## 2021-08-25 DIAGNOSIS — E785 Hyperlipidemia, unspecified: Secondary | ICD-10-CM

## 2021-08-25 DIAGNOSIS — I10 Essential (primary) hypertension: Secondary | ICD-10-CM

## 2021-08-25 DIAGNOSIS — E039 Hypothyroidism, unspecified: Secondary | ICD-10-CM

## 2021-08-25 DIAGNOSIS — Z131 Encounter for screening for diabetes mellitus: Secondary | ICD-10-CM

## 2021-08-25 DIAGNOSIS — R7309 Other abnormal glucose: Secondary | ICD-10-CM

## 2021-08-25 LAB — COMPREHENSIVE METABOLIC PANEL
ALT: 25 U/L (ref 0–44)
AST: 19 U/L (ref 15–41)
Albumin: 4.2 g/dL (ref 3.5–5.0)
Alkaline Phosphatase: 106 U/L (ref 38–126)
Anion gap: 10 (ref 5–15)
BUN: 19 mg/dL (ref 6–20)
CO2: 25 mmol/L (ref 22–32)
Calcium: 9.9 mg/dL (ref 8.9–10.3)
Chloride: 99 mmol/L (ref 98–111)
Creatinine, Ser: 0.91 mg/dL (ref 0.44–1.00)
GFR, Estimated: >60 mL/min (ref >60)
Glucose, Bld: 116 mg/dL — ABNORMAL HIGH (ref 70–99)
Potassium: 3.8 mmol/L (ref 3.5–5.1)
Sodium: 134 mmol/L — ABNORMAL LOW (ref 135–145)
Total Bilirubin: 0.7 mg/dL (ref 0.3–1.2)
Total Protein: 8.7 g/dL — ABNORMAL HIGH (ref 6.5–8.1)

## 2021-08-25 LAB — LIPID PANEL
Cholesterol: 120 mg/dL (ref 0–200)
HDL: 52 mg/dL (ref >40)
LDL Cholesterol: 48 mg/dL (ref 0–99)
Total CHOL/HDL Ratio: 2.3 RATIO
Triglycerides: 101 mg/dL (ref <150)
VLDL: 20 mg/dL (ref 0–40)

## 2021-08-25 LAB — HEMOGLOBIN A1C
Hgb A1c MFr Bld: 5.6 % (ref 4.8–5.6)
Mean Plasma Glucose: 114.02 mg/dL

## 2021-08-25 LAB — TSH: TSH: 1.819 u[IU]/mL (ref 0.350–4.500)

## 2021-08-26 ENCOUNTER — Other Ambulatory Visit: Payer: Self-pay

## 2021-08-26 ENCOUNTER — Encounter: Payer: Self-pay | Admitting: Physician Assistant

## 2021-08-26 ENCOUNTER — Ambulatory Visit: Payer: Self-pay | Admitting: Physician Assistant

## 2021-08-26 VITALS — BP 131/76 | Temp 98.1°F | Wt 285.0 lb

## 2021-08-26 DIAGNOSIS — I10 Essential (primary) hypertension: Secondary | ICD-10-CM

## 2021-08-26 DIAGNOSIS — Z1211 Encounter for screening for malignant neoplasm of colon: Secondary | ICD-10-CM

## 2021-08-26 DIAGNOSIS — E039 Hypothyroidism, unspecified: Secondary | ICD-10-CM

## 2021-08-26 DIAGNOSIS — E785 Hyperlipidemia, unspecified: Secondary | ICD-10-CM

## 2021-08-26 LAB — IFOBT (OCCULT BLOOD): IFOBT: NEGATIVE

## 2021-08-26 NOTE — Progress Notes (Signed)
BP 131/76    Temp 98.1 F (36.7 C)    Wt 285 lb (129.3 kg)    BMI 46.00 kg/m    Subjective:    Patient ID: Barbara Snow, female    DOB: 01-Oct-1965, 56 y.o.   MRN: [id number]  HPI: Barbara Snow is a 56 y.o. female presenting on 08/26/2021 for Hyperlipidemia and Hypertension   HPI   Chief Complaint  Patient presents with   Hyperlipidemia   Hypertension      Her sister was recently dx with breast cancer.  Pt is tired from being at the hospital all night with her sister.    She got her FIT test with her  She had LEEP last year after abn PAP- she will return to gyn later this year for follow up  Pt is doing well and has no complaints other than just being tired.    Relevant past medical, surgical, family and social history reviewed and updated as indicated. Interim medical history since our last visit reviewed. Allergies and medications reviewed and updated.    Current Outpatient Medications:    amLODipine (NORVASC) 10 MG tablet, TAKE 1 Tablet BY MOUTH ONCE DAILY, Disp: 90 tablet, Rfl: 0   aspirin EC 81 MG tablet, Take 81 mg by mouth daily. Swallow whole., Disp: , Rfl:    atorvastatin (LIPITOR) 40 MG tablet, TAKE 1 Tablet BY MOUTH ONCE EVERY NIGHT AT BEDTIME, Disp: 90 tablet, Rfl: 0   hydrochlorothiazide (HYDRODIURIL) 25 MG tablet, TAKE 1 Tablet BY MOUTH ONCE DAILY, Disp: 90 tablet, Rfl: 0   lisinopril (ZESTRIL) 20 MG tablet, Take 1 tablet (20 mg total) by mouth daily., Disp: 30 tablet, Rfl: 0   pantoprazole (PROTONIX) 40 MG tablet, Take 1 tablet by mouth twice daily, Disp: 60 tablet, Rfl: 1   potassium chloride (MICRO-K) 10 MEQ CR capsule, TAKE 1 Capsule BY MOUTH ONCE DAILY, Disp: 90 capsule, Rfl: 0   SYNTHROID 50 MCG tablet, TAKE 1 Tablet BY MOUTH ONCE DAILY, Disp: 90 tablet, Rfl: 0   Review of Systems  Per HPI unless specifically indicated above     Objective:    BP 131/76    Temp 98.1 F (36.7 C)    Wt 285 lb (129.3 kg)    BMI 46.00 kg/m   Wt Readings from  Last 3 Encounters:  08/26/21 285 lb (129.3 kg)  05/27/21 292 lb (132.5 kg)  05/16/21 288 lb 6.4 oz (130.8 kg)    Physical Exam Vitals reviewed.  Constitutional:      General: She is not in acute distress.    Appearance: She is well-developed. She is not toxic-appearing.  HENT:     Head: Normocephalic and atraumatic.  Cardiovascular:     Rate and Rhythm: Normal rate and regular rhythm.  Pulmonary:     Effort: Pulmonary effort is normal.     Breath sounds: Normal breath sounds.  Abdominal:     General: Bowel sounds are normal.     Palpations: Abdomen is soft. There is no mass.     Tenderness: There is no abdominal tenderness.  Musculoskeletal:     Cervical back: Neck supple.     Right lower leg: No edema.     Left lower leg: No edema.  Lymphadenopathy:     Cervical: No cervical adenopathy.  Skin:    General: Skin is warm and dry.  Neurological:     Mental Status: She is alert and oriented to person, place, and time.  Psychiatric:  Attention and Perception: Attention normal.        Mood and Affect: Mood normal.        Speech: Speech normal.        Behavior: Behavior normal. Behavior is cooperative.    Results for orders placed or performed during the hospital encounter of 08/25/21  Hemoglobin A1c  Result Value Ref Range   Hgb A1c MFr Bld 5.6 4.8 - 5.6 %   Mean Plasma Glucose 114.02 mg/dL  Comprehensive metabolic panel  Result Value Ref Range   Sodium 134 (L) 135 - 145 mmol/L   Potassium 3.8 3.5 - 5.1 mmol/L   Chloride 99 98 - 111 mmol/L   CO2 25 22 - 32 mmol/L   Glucose, Bld 116 (H) 70 - 99 mg/dL   BUN 19 6 - 20 mg/dL   Creatinine, Ser 0.91 0.44 - 1.00 mg/dL   Calcium 9.9 8.9 - 10.3 mg/dL   Total Protein 8.7 (H) 6.5 - 8.1 g/dL   Albumin 4.2 3.5 - 5.0 g/dL   AST 19 15 - 41 U/L   ALT 25 0 - 44 U/L   Alkaline Phosphatase 106 38 - 126 U/L   Total Bilirubin 0.7 0.3 - 1.2 mg/dL   GFR, Estimated >60 >60 mL/min   Anion gap 10 5 - 15  Lipid panel  Result Value  Ref Range   Cholesterol 120 0 - 200 mg/dL   Triglycerides 101 <150 mg/dL   HDL 52 >40 mg/dL   Total CHOL/HDL Ratio 2.3 RATIO   VLDL 20 0 - 40 mg/dL   LDL Cholesterol 48 0 - 99 mg/dL  TSH  Result Value Ref Range   TSH 1.819 0.350 - 4.500 uIU/mL      Assessment & Plan:    Encounter Diagnoses  Name Primary?   Essential hypertension Yes   Hypothyroidism, unspecified type    Hyperlipidemia, unspecified hyperlipidemia type    Screening for colon cancer    Morbid obesity ([place])       -Reviewed labs with pt -pt to Continue current medications  -pt to follow up 3 months.  She is to contact office sooner prn

## 2021-09-04 ENCOUNTER — Encounter (HOSPITAL_COMMUNITY): Payer: Self-pay

## 2021-09-04 ENCOUNTER — Other Ambulatory Visit: Payer: Self-pay

## 2021-09-04 ENCOUNTER — Emergency Department (HOSPITAL_COMMUNITY)
Admission: EM | Admit: 2021-09-04 | Discharge: 2021-09-04 | Disposition: A | Discharge status: Home / Self Care | Payer: Self-pay | Attending: Emergency Medicine | Admitting: Emergency Medicine

## 2021-09-04 DIAGNOSIS — M25561 Pain in right knee: Secondary | ICD-10-CM | POA: Insufficient documentation

## 2021-09-04 MED ORDER — KETOROLAC TROMETHAMINE 30 MG/ML IJ SOLN
15.0000 mg | Freq: Once | INTRAMUSCULAR | Status: AC
Start: 2021-09-04 — End: 2021-09-04
  Administered 2021-09-04: 10:00:00 15 mg via INTRAMUSCULAR
  Filled 2021-09-04: qty 1

## 2021-09-04 MED ORDER — PREDNISONE 20 MG PO TABS: 40.0000 mg | ORAL_TABLET | Freq: Every day | ORAL | 0 refills | Status: DC

## 2021-09-04 NOTE — ED Triage Notes (Signed)
Patient complaining of right knee pain off and on for a month.  ?

## 2021-09-04 NOTE — ED Provider Notes (Signed)
?[facility] ?Provider Note ? ? ?CSN: [id number] ?Arrival date & time: 09/04/21  0803 ? ?  ? ?History ? ?Chief Complaint  ?Patient presents with  ? Knee Pain  ? ? ?Barbara Snow is a 56 y.o. female. ? ? ?Knee Pain ?Associated symptoms: no back pain   ?Patient presents with right knee pain.  Has had on and off for the last month or 2.  No definite injury.  States the knee will feel weak at times.  States she has fallen because of it.  States she had an x-ray done that showed some spurs.  Had seen her PCP but has not seen orthopedic surgery.  No new injury but states it got more painful.  Patient states the pain is on the top anterior part of the knee and also somewhat on the sides.  No hip pain.  No swelling of the knee. ?  ? ?Home Medications ?Prior to Admission medications   ?Medication Sig Start Date End Date Taking? Authorizing Provider  ?predniSONE (DELTASONE) 20 MG tablet Take 2 tablets (40 mg total) by mouth daily. 09/04/21  Yes Davonna Belling, MD  ?amLODipine (NORVASC) 10 MG tablet TAKE 1 Tablet BY MOUTH ONCE DAILY 08/19/21   Soyla Dryer, PA-C  ?aspirin EC 81 MG tablet Take 81 mg by mouth daily. Swallow whole.    [provider]  ?atorvastatin (LIPITOR) 40 MG tablet TAKE 1 Tablet BY MOUTH ONCE EVERY NIGHT AT BEDTIME 08/19/21   Soyla Dryer, PA-C  ?hydrochlorothiazide (HYDRODIURIL) 25 MG tablet TAKE 1 Tablet BY MOUTH ONCE DAILY 08/19/21   Soyla Dryer, PA-C  ?lisinopril (ZESTRIL) 20 MG tablet Take 1 tablet (20 mg total) by mouth daily. 05/30/21   Soyla Dryer, PA-C  ?pantoprazole (PROTONIX) 40 MG tablet Take 1 tablet by mouth twice daily 07/22/21   Soyla Dryer, PA-C  ?potassium chloride (MICRO-K) 10 MEQ CR capsule TAKE 1 Capsule BY MOUTH ONCE DAILY 08/19/21   Soyla Dryer, PA-C  ?SYNTHROID 50 MCG tablet TAKE 1 Tablet BY MOUTH ONCE DAILY 08/19/21   Soyla Dryer, PA-C  ?   ? ?Allergies    ?Patient has no known allergies.   ? ?Review of Systems   ?Review of  Systems  ?Constitutional:  Negative for appetite change.  ?Musculoskeletal:  Negative for back pain.  ? ?Physical Exam ?Updated Vital Signs ?BP (!) 145/87 (BP Location: Right Arm)   Pulse 70   Temp 98.1 ?F (36.7 ?C) (Oral)   Resp 20   Ht '5\' 5"'$  (1.651 m)   Wt 122.5 kg   SpO2 98%   BMI 44.93 kg/m?  ?Physical Exam ?Vitals and nursing note reviewed.  ?Constitutional:   ?   Appearance: She is obese.  ?Musculoskeletal:  ?   Comments: Mild tenderness to knee.  Mildly decreased range of motion.  Effusion palpable.  Tenderness medially laterally and anterior somewhat superior to the patella.  Knee appears stable.  ?Skin: ?   Capillary Refill: Capillary refill takes less than 2 seconds.  ?Neurological:  ?   Mental Status: She is alert.  ? ? ?ED Results / Procedures / Treatments   ?Labs ?(all labs ordered are listed, but only abnormal results are displayed) ?Labs Reviewed - No data to display ? ?EKG ?None ? ?Radiology ?No results found. ? ?Procedures ?Procedures  ? ? ?Medications Ordered in ED ?Medications  ?ketorolac (TORADOL) 30 MG/ML injection 15 mg (has no administration in time range)  ? ? ?ED Course/ Medical Decision Making/ A&P ?  ?                        ?  Medical Decision Making ?Risk ?Prescription drug management. ? ? ?Patient with right knee pain.  Has effusion.  Has had recent x-ray with no new injury since then.  Good range of motion.  Doubt infection.  Toradol shot given here.  Will give steroids and Ortho follow-up.  Immobilizer given for comfort.  Already has cane she has been using.  Appears stable for discharge home. ? ? ? ? ? ? ? ?Final Clinical Impression(s) / ED Diagnoses ?Final diagnoses:  ?Acute pain of right knee  ? ? ?Rx / DC Orders ?ED Discharge Orders   ? ?      Ordered  ?  predniSONE (DELTASONE) 20 MG tablet  Daily       ? 09/04/21 0924  ? ?  ?  ? ?  ? ? ?  ?Davonna Belling, MD ?09/04/21 [phone] ? ?

## 2021-09-04 NOTE — Discharge Instructions (Addendum)
Use the knee immobilizer as needed to help control the pain.  Follow-up with orthopedic surgery.  The steroids hopefully should help with the inflammation. ?

## 2021-09-10 ENCOUNTER — Encounter: Payer: Self-pay | Admitting: Orthopedic Surgery

## 2021-09-10 ENCOUNTER — Ambulatory Visit (INDEPENDENT_AMBULATORY_CARE_PROVIDER_SITE_OTHER): Payer: Self-pay | Admitting: Orthopedic Surgery

## 2021-09-10 ENCOUNTER — Other Ambulatory Visit: Payer: Self-pay

## 2021-09-10 DIAGNOSIS — M25561 Pain in right knee: Secondary | ICD-10-CM

## 2021-09-10 NOTE — Patient Instructions (Addendum)
Instructions Following Joint Injections ? ?In clinic today, you received an injection in one of your joints (sometimes more than one).  Occasionally, you can have some pain at the injection site, this is normal.  You can place ice at the injection site, or take over-the-counter medications such as Tylenol (acetaminophen) or Advil (ibuprofen).  Please follow all directions listed on the bottle. ? ?If your joint (knee or shoulder) becomes swollen, red or very painful, please contact the clinic for additional assistance.  ? ?Two medications were injected, including lidocaine and a steroid (often referred to as cortisone).  Lidocaine is effective almost immediately but wears off quickly.  However, the steroid can take a few days to improve your symptoms.  In some cases, it can make your pain worse for a couple of days.  Do not be concerned if this happens as it is common.  You can apply ice or take some over-the-counter medications as needed.  ? ? ? ? ? ?Knee Exercises ? ?Ask your health care provider which exercises are safe for you. Do exercises exactly as told by your health care provider and adjust them as directed. It is normal to feel mild stretching, pulling, tightness, or discomfort as you do these exercises. Stop right away if you feel sudden pain or your pain gets worse. Do not begin these exercises until told by your health care provider. ? ?Stretching and range-of-motion exercises ?These exercises warm up your muscles and joints and improve the movement and flexibility of your knee. These exercises also help to relieve pain and swelling. ? ?Knee extension, prone ?Lie on your abdomen (prone position) on a bed. ?Place your left / right knee just beyond the edge of the surface so your knee is not on the bed. You can put a towel under your left / right thigh just above your kneecap for comfort. ?Relax your leg muscles and allow gravity to straighten your knee (extension). You should feel a stretch behind your  left / right knee. ?Hold this position for 10 seconds. ?Scoot up so your knee is supported between repetitions. ?Repeat 10 times. Complete this exercise 3-4 times per week. ?    ?Knee flexion, active ?Lie on your back with both legs straight. If this causes back discomfort, bend your left / right knee so your foot is flat on the floor. ?Slowly slide your left / right heel back toward your buttocks. Stop when you feel a gentle stretch in the front of your knee or thigh (flexion). ?Hold this position for 10 seconds. ?Slowly slide your left / right heel back to the starting position. ?Repeat 10 times. Complete this exercise 3-4 times per week. ?  ?   ?Quadriceps stretch, prone ?Lie on your abdomen on a firm surface, such as a bed or padded floor. ?Bend your left / right knee and hold your ankle. If you cannot reach your ankle or pant leg, loop a belt around your foot and grab the belt instead. ?Gently pull your heel toward your buttocks. Your knee should not slide out to the side. You should feel a stretch in the front of your thigh and knee (quadriceps). ?Hold this position for 10 seconds. ?Repeat 10 times. Complete this exercise 3-4 times per week. ?  ?   ?Hamstring, supine ?Lie on your back (supine position). ?Loop a belt or towel over the ball of your left / right foot. The ball of your foot is on the walking surface, right under your toes. ?Straighten your  left / right knee and slowly pull on the belt to raise your leg until you feel a gentle stretch behind your knee (hamstring). ?Do not let your knee bend while you do this. ?Keep your other leg flat on the floor. ?Hold this position for 10 seconds. ?Repeat 10 times. Complete this exercise 3-4 times per week. ? ? ?Strengthening exercises ?These exercises build strength and endurance in your knee. Endurance is the ability to use your muscles for a long time, even after they get tired. ? ?Quadriceps, isometric ?This exercise stretches the muscles in front of your  thigh (quadriceps) without moving your knee joint (isometric). ?Lie on your back with your left / right leg extended and your other knee bent. Put a rolled towel or small pillow under your knee if told by your health care provider. ?Slowly tense the muscles in the front of your left / right thigh. You should see your kneecap slide up toward your hip or see increased dimpling just above the knee. This motion will push the back of the knee toward the floor. ?For 10 seconds, hold the muscle as tight as you can without increasing your pain. ?Relax the muscles slowly and completely. ?Repeat 10 times. Complete this exercise 3-4 times per week. ?.  ?   ?Straight leg raises ?This exercise stretches the muscles in front of your thigh (quadriceps) and the muscles that move your hips (hip flexors). ?Lie on your back with your left / right leg extended and your other knee bent. ?Tense the muscles in the front of your left / right thigh. You should see your kneecap slide up or see increased dimpling just above the knee. Your thigh may even shake a bit. ?Keep these muscles tight as you raise your leg 4-6 inches (10-15 cm) off the floor. Do not let your knee bend. ?Hold this position for 10 seconds. ?Keep these muscles tense as you lower your leg. ?Relax your muscles slowly and completely after each repetition. ?Repeat 10 times. Complete this exercise 3-4 times per week. ? ?Hamstring, isometric ?Lie on your back on a firm surface. ?Bend your left / right knee about 30 degrees. ?Dig your left / right heel into the surface as if you are trying to pull it toward your buttocks. Tighten the muscles in the back of your thighs (hamstring) to "dig" as hard as you can without increasing any pain. ?Hold this position for 10 seconds. ?Release the tension gradually and allow your muscles to relax completely for __________ seconds after each repetition. ?Repeat 10 times. Complete this exercise 3-4 times per week. ? ?Hamstring curls ?If told by  your health care provider, do this exercise while wearing ankle weights. Begin with 5 lb weights. Then increase the weight by 1 lb (0.5 kg) increments. You can also use an exercise band ?Lie on your abdomen with your legs straight. ?Bend your left / right knee as far as you can without feeling pain. Keep your hips flat against the floor. ?Hold this position for 10 seconds. ?Slowly lower your leg to the starting position. ?Repeat 10 times. Complete this exercise 3-4 times per week. ?  ?   ?Squats ?This exercise strengthens the muscles in front of your thigh and knee (quadriceps). ?Stand in front of a table, with your feet and knees pointing straight ahead. You may rest your hands on the table for balance but not for support. ?Slowly bend your knees and lower your hips like you are going to sit in a  chair. ?Keep your weight over your heels, not over your toes. ?Keep your lower legs upright so they are parallel with the table legs. ?Do not let your hips go lower than your knees. ?Do not bend lower than told by your health care provider. ?If your knee pain increases, do not bend as low. ?Hold the squat position for 10 seconds. ?Slowly push with your legs to return to standing. Do not use your hands to pull yourself to standing. ?Repeat 10 times. Complete this exercise 3-4 times per week ?. ?    ?Wall slides ?This exercise strengthens the muscles in front of your thigh and knee (quadriceps). ?Lean your back against a smooth wall or door, and walk your feet out 18-24 inches (46-61 cm) from it. ?Place your feet hip-width apart. ?Slowly slide down the wall or door until your knees bend 90 degrees. Keep your knees over your heels, not over your toes. Keep your knees in line with your hips. ?Hold this position for 10 seconds. ?Repeat 10 times. Complete this exercise 3-4 times per week. ?  ?   ?Straight leg raises ?This exercise strengthens the muscles that rotate the leg at the hip and move it away from your body (hip  abductors). ?Lie on your side with your left / right leg in the top position. Lie so your head, shoulder, knee, and hip line up. You may bend your bottom knee to help you keep your balance. ?Roll your hips slig

## 2021-09-11 NOTE — Progress Notes (Signed)
New Patient Visit ? ?Assessment: ?Barbara Snow is a 56 y.o. female with the following: ?1. Acute pain of right knee ? ?Plan: ?Mrs. Benscoter has had acute onset of right knee pain.  She has had some pain in her right knee for a couple of months, with acute worsening over the past 1-2 weeks.  No specific injury.  Her symptoms did not improve with short course of prednisone.  We reviewed radiographs today which does demonstrate some medial compartment narrowing.  It is possible that she has sustained an injury to the meniscus, or has some bone bruising.  We will try to treat her symptoms at this point with a knee injection.  She is interested.  She elected to proceed.  I have also given her exercises to initiate when he starts feeling better.  If her pain does not improve, I have asked her to return to clinic.  She was given a prescription for a rolling walker.  At that point, would recommend obtaining an MRI. ? ?Procedure note injection Right knee joint ?  ?Verbal consent was obtained to inject the right knee joint  ?Timeout was completed to confirm the site of injection.  The skin was prepped with alcohol and ethyl chloride was sprayed at the injection site.  ?A 21-gauge needle was used to inject 40 mg of Depo-Medrol and 1% lidocaine (3 cc) into the right knee using an anterolateral approach.  ?There were no complications. A sterile bandage was applied. ? ? ? ?Follow-up: ?Return if symptoms worsen or fail to improve. ? ?Subjective: ? ?Chief Complaint  ?Patient presents with  ? Knee Pain  ?  Fall 09/04/21. Landed on right knee, pain all around the knee.  Medial shooting pains.  She had been feeling and still is a tightening in the leg above the knee.  + giveway + catching.  Ibuprofen and tylenol.  ? ? ?History of Present Illness: ?Barbara Snow is a 56 y.o. female who presents for evaluation of right knee pain.  She states she had pain in the medial aspect of the right knee for a couple of months.  No specific onset.  She  noted an acute worsening approximate 1-2 weeks ago.  The pain is severe within the medial knee.  She also has pain in the superior aspect of the knee.  She states the knee feels tight.  She presented to the emergency department last week, and was given some prednisone.  She continues to have difficulty ambulating.  She has not had an injection.  She has not worked with physical therapy. ? ? ?Review of Systems: ?No fevers or chills ?No numbness or tingling ?No chest pain ?No shortness of breath ?No bowel or bladder dysfunction ?No GI distress ?No headaches ? ? ?Medical History: ? ?Past Medical History:  ?Diagnosis Date  ? Complication of anesthesia   ? COPD (chronic obstructive pulmonary disease) ([place])   ? Depression   ? Hyperlipidemia   ? Hypertension   ? Hypothyroidism   ? Kidney stone   ? PONV (postoperative nausea and vomiting)   ? Stroke [facility]) 10/2017  ? ? ?Past Surgical History:  ?Procedure Laterality Date  ? ABDOMINAL HYSTERECTOMY    ? SUPRACERVICAL  ? CHOLECYSTECTOMY    ? LEEP N/A 05/06/2021  ? Procedure: LOOP ELECTROSURGICAL EXCISION PROCEDURE (LEEP);  Surgeon: Janyth Pupa, DO;  Location: [facility];  Service: Gynecology;  Laterality: N/A;  ? TUBAL LIGATION    ? ? ?Family History  ?Problem Relation Age  of Onset  ? Diabetes Mother   ? Cancer Mother   ? Heart attack Father   ? Liver disease Father   ? Hypertension Daughter   ? Hypertension Daughter   ? Diabetes Sister   ? Hypertension Son   ? ?Social History  ? ?Tobacco Use  ? Smoking status: Former  ?  Packs/day: 0.00  ?  Years: 21.00  ?  Pack years: 0.00  ?  Types: Cigarettes  ?  Quit date: 11/06/2017  ?  Years since quitting: 3.8  ? Smokeless tobacco: Never  ?Vaping Use  ? Vaping Use: Never used  ?Substance Use Topics  ? Alcohol use: Yes  ?  Comment: occ  ? Drug use: No  ? ? ?No Known Allergies ? ?Current Meds  ?Medication Sig  ? amLODipine (NORVASC) 10 MG tablet TAKE 1 Tablet BY MOUTH ONCE DAILY  ? aspirin EC 81 MG tablet Take 81 mg by mouth daily. Swallow  whole.  ? atorvastatin (LIPITOR) 40 MG tablet TAKE 1 Tablet BY MOUTH ONCE EVERY NIGHT AT BEDTIME  ? hydrochlorothiazide (HYDRODIURIL) 25 MG tablet TAKE 1 Tablet BY MOUTH ONCE DAILY  ? lisinopril (ZESTRIL) 20 MG tablet Take 1 tablet (20 mg total) by mouth daily.  ? pantoprazole (PROTONIX) 40 MG tablet Take 1 tablet by mouth twice daily  ? potassium chloride (MICRO-K) 10 MEQ CR capsule TAKE 1 Capsule BY MOUTH ONCE DAILY  ? predniSONE (DELTASONE) 20 MG tablet Take 2 tablets (40 mg total) by mouth daily.  ? SYNTHROID 50 MCG tablet TAKE 1 Tablet BY MOUTH ONCE DAILY  ? ? ?Objective: ?There were no vitals taken for this visit. ? ?Physical Exam: ? ?General: Alert and oriented. and No acute distress. ?Gait: Right sided antalgic gait. ? ?Evaluation of the right knee demonstrates a mild varus alignment.  Mild effusion is appreciated.  She has diffuse tenderness to palpation, most prominently within the medial compartment.  She has difficulty with range of motion, but she is able to achieve full extension.  She tolerates flexion beyond 90 degrees.  Negative Lachman.  No increased laxity varus or valgus stress. ? ?IMAGING: ?I personally reviewed images previously obtained from the ED ? ?X-ray of the right knee demonstrates mild varus alignment.  These are nonweightbearing views, and demonstrates medial compartment narrowing.  Small osteophytes are appreciated. ? ? ?New Medications:  ?No orders of the defined types were placed in this encounter. ? ? ? ? ?Mordecai Rasmussen, MD ? ?09/11/2021 ?12:52 PM ? ? ?

## 2021-11-04 ENCOUNTER — Other Ambulatory Visit: Payer: Self-pay | Admitting: Physician Assistant

## 2021-11-04 DIAGNOSIS — I10 Essential (primary) hypertension: Secondary | ICD-10-CM

## 2021-11-04 DIAGNOSIS — E785 Hyperlipidemia, unspecified: Secondary | ICD-10-CM

## 2021-11-25 ENCOUNTER — Ambulatory Visit: Payer: Self-pay | Admitting: Physician Assistant

## 2021-11-27 ENCOUNTER — Other Ambulatory Visit: Payer: Self-pay | Admitting: Physician Assistant

## 2021-12-09 ENCOUNTER — Encounter: Payer: Self-pay | Admitting: Physician Assistant

## 2021-12-09 ENCOUNTER — Ambulatory Visit: Payer: Self-pay | Admitting: Physician Assistant

## 2021-12-09 ENCOUNTER — Other Ambulatory Visit (HOSPITAL_COMMUNITY)
Admission: RE | Admit: 2021-12-09 | Discharge: 2021-12-09 | Disposition: A | Discharge status: Home / Self Care | Payer: Self-pay | Source: Ambulatory Visit | Attending: Physician Assistant | Admitting: Physician Assistant

## 2021-12-09 VITALS — BP 126/84 | HR 67 | Temp 96.3°F | Wt 280.0 lb

## 2021-12-09 DIAGNOSIS — E785 Hyperlipidemia, unspecified: Secondary | ICD-10-CM

## 2021-12-09 DIAGNOSIS — I10 Essential (primary) hypertension: Secondary | ICD-10-CM | POA: Insufficient documentation

## 2021-12-09 DIAGNOSIS — E039 Hypothyroidism, unspecified: Secondary | ICD-10-CM

## 2021-12-09 DIAGNOSIS — M25561 Pain in right knee: Secondary | ICD-10-CM

## 2021-12-09 DIAGNOSIS — Z1239 Encounter for other screening for malignant neoplasm of breast: Secondary | ICD-10-CM

## 2021-12-09 LAB — COMPREHENSIVE METABOLIC PANEL
ALT: 24 U/L (ref 0–44)
AST: 19 U/L (ref 15–41)
Albumin: 3.9 g/dL (ref 3.5–5.0)
Alkaline Phosphatase: 103 U/L (ref 38–126)
Anion gap: 8 (ref 5–15)
BUN: 15 mg/dL (ref 6–20)
CO2: 30 mmol/L (ref 22–32)
Calcium: 9.4 mg/dL (ref 8.9–10.3)
Chloride: 100 mmol/L (ref 98–111)
Creatinine, Ser: 0.86 mg/dL (ref 0.44–1.00)
GFR, Estimated: >60 mL/min (ref >60)
Glucose, Bld: 105 mg/dL — ABNORMAL HIGH (ref 70–99)
Potassium: 3.3 mmol/L — ABNORMAL LOW (ref 3.5–5.1)
Sodium: 138 mmol/L (ref 135–145)
Total Bilirubin: 1.1 mg/dL (ref 0.3–1.2)
Total Protein: 8.2 g/dL — ABNORMAL HIGH (ref 6.5–8.1)

## 2021-12-09 LAB — LIPID PANEL
Cholesterol: 124 mg/dL (ref 0–200)
HDL: 46 mg/dL (ref >40)
LDL Cholesterol: 56 mg/dL (ref 0–99)
Total CHOL/HDL Ratio: 2.7 RATIO
Triglycerides: 108 mg/dL (ref <150)
VLDL: 22 mg/dL (ref 0–40)

## 2021-12-09 MED ORDER — POTASSIUM CHLORIDE ER 10 MEQ PO CPCR: 10.0000 meq | ORAL_CAPSULE | Freq: Every day | ORAL | 0 refills | Status: DC

## 2021-12-09 MED ORDER — AMLODIPINE BESYLATE 10 MG PO TABS: 10.0000 mg | ORAL_TABLET | Freq: Every day | ORAL | 0 refills | Status: DC

## 2021-12-09 MED ORDER — HYDROCHLOROTHIAZIDE 25 MG PO TABS: 25.0000 mg | ORAL_TABLET | Freq: Every day | ORAL | 0 refills | Status: DC

## 2021-12-09 MED ORDER — ATORVASTATIN CALCIUM 40 MG PO TABS: 40.0000 mg | ORAL_TABLET | Freq: Every day | ORAL | 0 refills | Status: DC

## 2021-12-09 MED ORDER — LISINOPRIL 20 MG PO TABS: 20.0000 mg | ORAL_TABLET | Freq: Every day | ORAL | 0 refills | Status: DC

## 2021-12-09 NOTE — Progress Notes (Signed)
BP 126/84   Pulse 67   Temp (!) 96.3 F (35.7 C)   Wt 280 lb (127 kg)   SpO2 97%   BMI 46.59 kg/m    Subjective:    Patient ID: Barbara Snow, female    DOB: July 12, 1965, 56 y.o.   MRN: [id number]  HPI: Barbara Snow is a 56 y.o. female presenting on 12/09/2021 for Hypertension and Hyperlipidemia   HPI  Chief Complaint  Patient presents with   Hypertension   Hyperlipidemia     She is still working at [employer].  She is working on weight loss with healthy diet and regular exercise.    She saw orthopedist in march for knee pain- she got injection in the knee and a prescription for a rolling walker.  She says she isn't getting a rolling walker.  She says she is supposed to f/u in about 3 month (ie now)  She has no complaints today.  She got her labs drawn this morning.    Relevant past medical, surgical, family and social history reviewed and updated as indicated. Interim medical history since our last visit reviewed. Allergies and medications reviewed and updated.   Current Outpatient Medications:    amLODipine (NORVASC) 10 MG tablet, TAKE 1 Tablet BY MOUTH ONCE DAILY, Disp: 90 tablet, Rfl: 0   aspirin EC 81 MG tablet, Take 81 mg by mouth daily. Swallow whole., Disp: , Rfl:    atorvastatin (LIPITOR) 40 MG tablet, TAKE 1 Tablet BY MOUTH ONCE EVERY NIGHT AT BEDTIME, Disp: 90 tablet, Rfl: 0   hydrochlorothiazide (HYDRODIURIL) 25 MG tablet, TAKE 1 Tablet BY MOUTH ONCE DAILY, Disp: 90 tablet, Rfl: 0   lisinopril (ZESTRIL) 20 MG tablet, Take 1 tablet (20 mg total) by mouth daily., Disp: 30 tablet, Rfl: 0   pantoprazole (PROTONIX) 40 MG tablet, Take 1 tablet by mouth twice daily, Disp: 60 tablet, Rfl: 1   potassium chloride (MICRO-K) 10 MEQ CR capsule, TAKE 1 Capsule BY MOUTH ONCE DAILY, Disp: 90 capsule, Rfl: 0   SYNTHROID 50 MCG tablet, TAKE 1 Tablet BY MOUTH ONCE DAILY, Disp: 90 tablet, Rfl: 0     Review of Systems  Per HPI unless specifically indicated above      Objective:    BP 126/84   Pulse 67   Temp (!) 96.3 F (35.7 C)   Wt 280 lb (127 kg)   SpO2 97%   BMI 46.59 kg/m   Wt Readings from Last 3 Encounters:  12/09/21 280 lb (127 kg)  09/04/21 270 lb (122.5 kg)  08/26/21 285 lb (129.3 kg)    Physical Exam Vitals reviewed.  Constitutional:      General: She is not in acute distress.    Appearance: She is well-developed. She is obese. She is not ill-appearing.  HENT:     Head: Normocephalic and atraumatic.  Cardiovascular:     Rate and Rhythm: Normal rate and regular rhythm.  Pulmonary:     Effort: Pulmonary effort is normal.     Breath sounds: Normal breath sounds.  Abdominal:     General: Bowel sounds are normal.     Palpations: Abdomen is soft. There is no mass.     Tenderness: There is no abdominal tenderness.  Musculoskeletal:     Cervical back: Neck supple.     Right lower leg: No edema.     Left lower leg: No edema.  Lymphadenopathy:     Cervical: No cervical adenopathy.  Skin:  General: Skin is warm and dry.  Neurological:     Mental Status: She is alert and oriented to person, place, and time.  Psychiatric:        Behavior: Behavior normal.         Assessment & Plan:   Encounter Diagnoses  Name Primary?   Essential hypertension Yes   Hyperlipidemia, unspecified hyperlipidemia type    Hypothyroidism, unspecified type    Morbid obesity ([place])    Right knee pain, unspecified chronicity    Encounter for screening for malignant neoplasm of breast, unspecified screening modality      HCM: -colon cancer screening UTD-  FIT nl -will refer for screening Mammogram which is due august -PAP - will repeat at next appointment  Htn/dyslipidemia -Labs pending.  She will be called with results.  Rx will be sent after review of labs  Obesity -Pt encouarged to continue healthy weight loss  Knee pain -Pt to contact orthopedics for follow up as directed   Pt to follow up 4 months.  She is to contact office  sooner prn

## 2022-03-03 ENCOUNTER — Telehealth: Payer: Self-pay

## 2022-03-03 NOTE — Telephone Encounter (Signed)
Telephoned patient at mobile number. Left a voice message with [facility] (scholarship) contact information. 

## 2022-03-10 ENCOUNTER — Other Ambulatory Visit (HOSPITAL_COMMUNITY): Payer: Self-pay | Admitting: Obstetrics and Gynecology

## 2022-03-10 DIAGNOSIS — R928 Other abnormal and inconclusive findings on diagnostic imaging of breast: Secondary | ICD-10-CM

## 2022-03-12 ENCOUNTER — Other Ambulatory Visit: Payer: Self-pay | Admitting: Physician Assistant

## 2022-03-23 ENCOUNTER — Other Ambulatory Visit: Payer: Self-pay | Admitting: Physician Assistant

## 2022-03-23 MED ORDER — LISINOPRIL 20 MG PO TABS: 20.0000 mg | ORAL_TABLET | Freq: Every day | ORAL | 0 refills | Status: DC

## 2022-03-23 MED ORDER — AMLODIPINE BESYLATE 10 MG PO TABS: 10.0000 mg | ORAL_TABLET | Freq: Every day | ORAL | 0 refills | Status: DC

## 2022-03-23 NOTE — Progress Notes (Signed)
Pt called for refills.  Pt was notified that her enrollment with [facility] and [facility] expired.  She was given contact information for care connect to get updated.

## 2022-03-26 ENCOUNTER — Telehealth: Payer: Self-pay

## 2022-03-26 NOTE — Congregational Nurse Program (Signed)
Pt completed renewal enrollment into the [facility] Uninsured Program 9.27.23 requesting to continue with  new care with her current medical provider at the [facility]   Chief Reasons Needed for  PCP -Refills needed on meds    Socio-determinants health needs identified: -On-going medication assistance resource needed -food insecurity     Plan - [facility] Application Initiated for eligibility review and is pending approval Therapist, music application initiated to be expedited for review -Pt was referred to [employer] and was provided details  -Referral sent to RN Nurse Case Manager, Mayra Reel, for initial and continous medical case management upon completion of first medical appointment  -A sooner appointment than the prescheduled appt of 10.16.23 with [facility] will be attempted to be scheduled in order to best assist pt with being able to obtain all of her meds by the PCP

## 2022-04-01 ENCOUNTER — Other Ambulatory Visit: Payer: Self-pay | Admitting: Physician Assistant

## 2022-04-01 DIAGNOSIS — E785 Hyperlipidemia, unspecified: Secondary | ICD-10-CM

## 2022-04-01 DIAGNOSIS — I10 Essential (primary) hypertension: Secondary | ICD-10-CM

## 2022-04-01 DIAGNOSIS — E039 Hypothyroidism, unspecified: Secondary | ICD-10-CM

## 2022-04-02 ENCOUNTER — Other Ambulatory Visit: Payer: Self-pay | Admitting: Physician Assistant

## 2022-04-02 ENCOUNTER — Ambulatory Visit: Payer: Self-pay | Admitting: Physician Assistant

## 2022-04-02 ENCOUNTER — Encounter: Payer: Self-pay | Admitting: Physician Assistant

## 2022-04-02 VITALS — BP 148/86 | HR 67 | Temp 97.4°F

## 2022-04-02 DIAGNOSIS — I10 Essential (primary) hypertension: Secondary | ICD-10-CM

## 2022-04-02 MED ORDER — ATORVASTATIN CALCIUM 40 MG PO TABS: 40.0000 mg | ORAL_TABLET | Freq: Every day | ORAL | 0 refills | Status: DC

## 2022-04-02 MED ORDER — HYDROCHLOROTHIAZIDE 25 MG PO TABS: 25.0000 mg | ORAL_TABLET | Freq: Every day | ORAL | 0 refills | Status: DC

## 2022-04-02 MED ORDER — AMLODIPINE BESYLATE 10 MG PO TABS: 10.0000 mg | ORAL_TABLET | Freq: Every day | ORAL | 0 refills | Status: DC

## 2022-04-02 MED ORDER — POTASSIUM CHLORIDE ER 10 MEQ PO CPCR: 10.0000 meq | ORAL_CAPSULE | Freq: Every day | ORAL | 0 refills | Status: DC

## 2022-04-02 MED ORDER — LEVOTHYROXINE SODIUM 50 MCG PO TABS: 50.0000 ug | ORAL_TABLET | Freq: Every day | ORAL | 0 refills | Status: DC

## 2022-04-02 MED ORDER — LISINOPRIL 20 MG PO TABS: 20.0000 mg | ORAL_TABLET | Freq: Every day | ORAL | 0 refills | Status: DC

## 2022-04-02 NOTE — Progress Notes (Signed)
BP (!) 148/86   Pulse 67   Temp (!) 97.4 F (36.3 C)   SpO2 94%    Subjective:    Patient ID: Barbara Snow, female    DOB: 03/21/1966, 56 y.o.   MRN: [id number]  HPI: Barbara Snow is a 56 y.o. female presenting on 04/02/2022 for No chief complaint on file.   HPI  Pt came to office today to get flu shot and said she felt like she had a HA and wanted a note to be out of work.   Discussed that she would need appt before we could consider work note.  She was worked in to be seen today.   She says she is out of all of her meds except the protonix and lisinopril.   She just recently renewed with [facility] and says she didn't know she needed to call them for refills.   She denies HA, cough.  Says she just feels headachey and tired.   Relevant past medical, surgical, family and social history reviewed and updated as indicated. Interim medical history since our last visit reviewed. Allergies and medications reviewed and updated.   Current Outpatient Medications:    pantoprazole (PROTONIX) 40 MG tablet, Take 1 tablet by mouth twice daily, Disp: 60 tablet, Rfl: 1   amLODipine (NORVASC) 10 MG tablet, Take 1 tablet (10 mg total) by mouth daily., Disp: 90 tablet, Rfl: 0   aspirin EC 81 MG tablet, Take 81 mg by mouth daily. Swallow whole. (Patient not taking: Reported on 04/02/2022), Disp: , Rfl:    atorvastatin (LIPITOR) 40 MG tablet, Take 1 tablet (40 mg total) by mouth daily., Disp: 90 tablet, Rfl: 0   hydrochlorothiazide (HYDRODIURIL) 25 MG tablet, Take 1 tablet (25 mg total) by mouth daily., Disp: 90 tablet, Rfl: 0   levothyroxine (SYNTHROID) 50 MCG tablet, Take 1 tablet (50 mcg total) by mouth daily., Disp: 90 tablet, Rfl: 0   lisinopril (ZESTRIL) 20 MG tablet, Take 1 tablet (20 mg total) by mouth daily., Disp: 90 tablet, Rfl: 0   potassium chloride (MICRO-K) 10 MEQ CR capsule, Take 1 capsule (10 mEq total) by mouth daily., Disp: 90 capsule, Rfl: 0    Review of Systems  Per HPI  unless specifically indicated above     Objective:    BP (!) 148/86   Pulse 67   Temp (!) 97.4 F (36.3 C)   SpO2 94%   Wt Readings from Last 3 Encounters:  12/09/21 280 lb (127 kg)  09/04/21 270 lb (122.5 kg)  08/26/21 285 lb (129.3 kg)    Physical Exam Vitals reviewed.  Constitutional:      General: She is not in acute distress.    Appearance: She is well-developed. She is not toxic-appearing.  HENT:     Head: Normocephalic and atraumatic.  Cardiovascular:     Rate and Rhythm: Normal rate and regular rhythm.  Pulmonary:     Effort: Pulmonary effort is normal.     Breath sounds: Normal breath sounds.  Musculoskeletal:     Cervical back: Neck supple.     Right lower leg: No edema.     Left lower leg: No edema.  Lymphadenopathy:     Cervical: No cervical adenopathy.  Skin:    General: Skin is warm and dry.  Neurological:     Mental Status: She is alert and oriented to person, place, and time.  Psychiatric:        Behavior: Behavior normal.  Assessment & Plan:   Encounter Diagnosis  Name Primary?   Essential hypertension Yes       -pt says she renewed her enrollment and updated her medassist application recently.   Rx were sent to [facility] for all her meds -she has follow up appointment on 10/16 at which time we will recheck her bp.   -pt is given note to be out of work today

## 2022-04-06 ENCOUNTER — Telehealth: Payer: Self-pay

## 2022-04-06 NOTE — Telephone Encounter (Signed)
Contacted pt and discussed with her the findings regarding her having [employer] as of 06/29/2021 and how it affects her uninsured status and enrollment with [facility] and her current provider  She stated he has no knowledge of signing up with [facility] for health coverage.  Pt was provided the phone number of the third party source to assist her with identifying the insurance source and details  Plan -was advised once she receives clarification on insurance details and if it is one she wishes to NOT continue to maintain due to affordability, then if she would like to re-enroll into [facility] then she would need to submit a letter of [place] to confirm she is no longer receiving services.   -Pt was provided with phone number of source to verify coverage  -Pt will be assisted with transitioning PCP if she has to be transition out to a new PCP  She stated she understood and call ended

## 2022-04-06 NOTE — Telephone Encounter (Signed)
Pt followed up with my return call to state  that her CC/[facility] and [facility] application and documents were ready for pickup.   home visit was scheduled for  [facility] Connect Renewal Enrollment  for 9.27.23 at 9:00am

## 2022-04-13 ENCOUNTER — Ambulatory Visit: Payer: Self-pay | Admitting: Physician Assistant

## 2022-05-08 ENCOUNTER — Inpatient Hospital Stay: Payer: 59 | Attending: Obstetrics and Gynecology | Admitting: Hematology and Oncology

## 2022-05-08 VITALS — BP 139/76 | Wt 272.5 lb

## 2022-05-08 DIAGNOSIS — N6310 Unspecified lump in the right breast, unspecified quadrant: Secondary | ICD-10-CM

## 2022-05-08 MED ORDER — LEVOTHYROXINE SODIUM 50 MCG PO TABS: 50.0000 ug | ORAL_TABLET | Freq: Every day | ORAL | 0 refills | Status: DC

## 2022-05-08 MED ORDER — ATORVASTATIN CALCIUM 40 MG PO TABS: 40.0000 mg | ORAL_TABLET | Freq: Every day | ORAL | 0 refills | Status: DC

## 2022-05-08 MED ORDER — HYDROCHLOROTHIAZIDE 25 MG PO TABS: 25.0000 mg | ORAL_TABLET | Freq: Every day | ORAL | 0 refills | Status: DC

## 2022-05-08 MED ORDER — LISINOPRIL 20 MG PO TABS: 20.0000 mg | ORAL_TABLET | Freq: Every day | ORAL | 0 refills | Status: DC

## 2022-05-08 MED ORDER — AMLODIPINE BESYLATE 10 MG PO TABS: 10.0000 mg | ORAL_TABLET | Freq: Every day | ORAL | 0 refills | Status: DC

## 2022-05-08 MED ORDER — PANTOPRAZOLE SODIUM 40 MG PO TBEC: 40.0000 mg | DELAYED_RELEASE_TABLET | Freq: Two times a day (BID) | ORAL | 0 refills | Status: DC

## 2022-05-08 MED ORDER — POTASSIUM CHLORIDE ER 10 MEQ PO CPCR: 10.0000 meq | ORAL_CAPSULE | Freq: Every day | ORAL | 0 refills | Status: DC

## 2022-05-08 MED ORDER — PREDNISONE 20 MG PO TABS: 20.0000 mg | ORAL_TABLET | Freq: Every day | ORAL | 0 refills | Status: DC

## 2022-05-08 NOTE — Progress Notes (Signed)
[name] is a 56 y.o. female who presents to [facility] clinic today with no complaints .    Pap Smear: Pap not smear completed today. Last Pap smear was 02/03/2021 at [facility] and was abnormal - ASCUS/ HPV+ . Per patient has history of an abnormal Pap smear. Last Pap smear result is available in Epic.   Physical exam: Breasts Breasts symmetrical. No skin abnormalities bilateral breasts. No nipple retraction bilateral breasts. No nipple discharge bilateral breasts. No lymphadenopathy. No lumps palpated bilateral breasts.     MS DIGITAL DIAG TOMO BILAT  Result Date: 02/20/2021 CLINICAL DATA:  One year follow-up for probably benign masses in the RIGHT breast. EXAM: DIGITAL DIAGNOSTIC BILATERAL MAMMOGRAM WITH TOMOSYNTHESIS AND CAD; ULTRASOUND RIGHT BREAST LIMITED TECHNIQUE: Bilateral digital diagnostic mammography and breast tomosynthesis was performed. The images were evaluated with computer-aided detection.; Targeted ultrasound examination of the right breast was performed COMPARISON:  Previous exam(s). ACR Breast Density Category b: There are scattered areas of fibroglandular density. FINDINGS: LEFT breast is negative. Circumscribed oval masses are again noted in the LATERAL portion of the RIGHT breast and further evaluated with ultrasound. Targeted ultrasound is performed, showing a circumscribed oval mass in the 9 o'clock location of the RIGHT breast 4 centimeters from the nipple which measures 0.7 x 0.3 x 0.6 centimeters. In the 10 o'clock location 4 centimeters from the nipple, an oval mass is 0.9 x 0.6 x 1.1 centimeters. No new masses or acoustic shadowing identified. IMPRESSION: 1. Stable appearance of benign-appearing masses in the RIGHT breast. 2.  No mammographic or ultrasound evidence for malignancy. RECOMMENDATION: Recommend bilateral diagnostic mammogram and RIGHT breast ultrasound in 1 year to complete follow-up. I have discussed the findings and recommendations with the patient. If  applicable, a reminder letter will be sent to the patient regarding the next appointment. BI-RADS CATEGORY  3: Probably benign. Electronically Signed   By: Nolon Nations M.D.   On: 02/20/2021 16:01  MS DIGITAL DIAG TOMO UNI RIGHT  Result Date: 06/11/2020 CLINICAL DATA:  Short-term follow-up for probably benign right breast masses. EXAM: DIGITAL DIAGNOSTIC UNILATERAL RIGHT MAMMOGRAM WITH TOMO AND CAD; ULTRASOUND RIGHT BREAST LIMITED COMPARISON:  Previous exams. ACR Breast Density Category b: There are scattered areas of fibroglandular density. FINDINGS: No suspicious masses or calcifications are seen in the right breast. The oval circumscribed masses in the slightly outer right breast appear unchanged. Mammographic images were processed with CAD. Targeted ultrasound of the outer right breast was performed. The oval circumscribed gently lobulated mass in the right breast at 10 o'clock 4 cm from nipple measures 1.1 x 0.5 x 1.2 cm, previously measured 1.2 x 0.6 x 1.2 cm. This is overall unchanged in size in appearance when compared to the prior exam. The additional mass at the 9 o'clock position 4 cm from nipple measured 0.8 x 0.3 cm RAD however could not be adequately reproduced in the other plane. No additional masses identified in the outer right breast. IMPRESSION: Stable appearance of probably benign masses in the outer right breast. RECOMMENDATION: Bilateral diagnostic mammography in 6 months with right breast ultrasound which will demonstrate 1 year of stability of the probably benign right breast masses. I have discussed the findings and recommendations with the patient. If applicable, a reminder letter will be sent to the patient regarding the next appointment. BI-RADS CATEGORY  3: Probably benign. Electronically Signed   By: Everlean Alstrom M.D.   On: 06/11/2020 14:37   MS DIGITAL DIAG TOMO UNI RIGHT  Result Date: 11/07/2019 CLINICAL DATA:  56 year old female presenting as a recall from screening  for possible right breast masses. EXAM: DIGITAL DIAGNOSTIC RIGHT MAMMOGRAM WITH TOMO ULTRASOUND RIGHT BREAST COMPARISON:  Previous exam(s). ACR Breast Density Category b: There are scattered areas of fibroglandular density. FINDINGS: Mammogram: Spot compression tomosynthesis views of the right breast performed. On the additional imaging there is persistence of to nearby obscured masses in the upper-outer quadrant of the right breast measuring approximately 1 cm and 0.8 cm. Ultrasound: Targeted ultrasound is performed in the upper-outer quadrant of the right breast demonstrating an oval circumscribed hypoechoic mass at 9 o'clock 4 cm from the nipple measuring 0.8 x 0.3 x 0.7 cm. Targeted ultrasound performed at 10 o'clock 4 cm from the nipple demonstrates an oval circumscribed hypoechoic mass measuring 1.2 x 0.6 x 1.2 cm. These masses likely corresponds to the mass identified mammographically. Targeted ultrasound of the right axilla demonstrates normal appearing lymph nodes. 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 RECOMMENDATION: Diagnostic right breast mammogram and ultrasound in 6 months. I have discussed the findings and recommendations with the patient. If applicable, a reminder letter will be sent to the patient regarding the next appointment. BI-RADS CATEGORY  3: Probably benign. Electronically Signed   By: Audie Pinto M.D.   On: 11/07/2019 09:10   MS DIGITAL SCREENING TOMO BILATERAL  Result Date: 10/30/2019 CLINICAL DATA:  Screening. EXAM: DIGITAL SCREENING BILATERAL MAMMOGRAM WITH TOMO AND CAD COMPARISON:  None. ACR Breast Density Category b: There are scattered areas of fibroglandular density. FINDINGS: In the right breast, masses warrant further evaluation. In the left breast, no findings suspicious for malignancy. Images were processed with CAD. IMPRESSION: Further evaluation is  suggested for possible masses in the right breast. RECOMMENDATION: Diagnostic mammogram and possibly ultrasound of the right breast. (Code:[id number]) The patient will be contacted regarding the findings, and additional imaging will be scheduled. BI-RADS CATEGORY  0: Incomplete. Need additional imaging evaluation and/or prior mammograms for comparison. Electronically Signed   By: Lovey Newcomer M.D.   On: 10/30/2019 10:00      Pelvic/Bimanual Pap is not indicated today    Smoking History: Patient has is a former smoker and was not referred to quit line.    Patient Navigation: Patient education provided. Access to services provided for patient through [facility] program. No interpreter provided. No transportation provided   Colorectal Cancer Screening: Per patient has never had colonoscopy completed No complaints today. FIT test negative 08/26/2021 at [facility]   Breast and Cervical Cancer Risk Assessment: Patient does not have family history of breast cancer, known genetic mutations, or radiation treatment to the chest before age 86. Patient does not have history of cervical dysplasia, immunocompromised, or DES exposure in-utero.  Risk Assessment   No risk assessment data for the current encounter  Risk Scores       04/18/2021   Last edited by: Loletta Parish, RN   5-year risk: 1 %   Lifetime risk: 6.2 %            A: [facility] exam without pap smear Diagnostic follow up of probable benign right breast mass. No complaints with benign exam today.   P: [facility] for [facility] mamm[facility]. Appointment scheduled 05/12/22.  [name], NP 05/08/2022 9:14 AM

## 2022-05-08 NOTE — Patient Instructions (Signed)
Taught Barbara Snow breast self awareness.  Patient did not need a Pap smear today due to last Pap smear was in 01/28/2021 per patient. She is being followed by [facility] as she has Medicaid with family planning. Referred [facility] to the [facility] for diagnostic mammogram. Appointment scheduled for 05/12/22. Patient aware of appointment and will be there. Let patient know will follow up with her within the next couple weeks with results. Barbara Snow verbalized understanding. She will return to clinic in one year for annual mammogram. I gave her a 2 week supply of medications today as she only has 1 tablet of each medication left and is scheduled for follow up next week.   [name], NP 10:03 AM

## 2022-05-12 ENCOUNTER — Inpatient Hospital Stay (HOSPITAL_COMMUNITY): Admission: RE | Admit: 2022-05-12 | Payer: Self-pay | Source: Ambulatory Visit

## 2022-05-18 ENCOUNTER — Encounter: Payer: Self-pay | Admitting: Obstetrics & Gynecology

## 2022-05-18 ENCOUNTER — Ambulatory Visit (INDEPENDENT_AMBULATORY_CARE_PROVIDER_SITE_OTHER): Payer: 59 | Admitting: Obstetrics & Gynecology

## 2022-05-18 ENCOUNTER — Other Ambulatory Visit (HOSPITAL_COMMUNITY)
Admission: RE | Admit: 2022-05-18 | Discharge: 2022-05-18 | Disposition: A | Discharge status: Home / Self Care | Payer: 59 | Source: Ambulatory Visit | Attending: Obstetrics & Gynecology | Admitting: Obstetrics & Gynecology

## 2022-05-18 VITALS — BP 136/82 | HR 66 | Ht 66.0 in | Wt 277.0 lb

## 2022-05-18 DIAGNOSIS — Z9889 Other specified postprocedural states: Secondary | ICD-10-CM

## 2022-05-18 DIAGNOSIS — Z01419 Encounter for gynecological examination (general) (routine) without abnormal findings: Secondary | ICD-10-CM | POA: Insufficient documentation

## 2022-05-18 NOTE — Progress Notes (Signed)
   WELL-WOMAN EXAMINATION Patient name: Barbara Snow MRN [id number]  Date of birth: 16-Aug-1965 Chief Complaint:   Gynecologic Exam  History of Present Illness:   Barbara Snow is a 56 y.o. G3P0 supracervical hysterectomy female being seen today for a routine well-woman exam.  Today she notes pain on her right breast-sharp pain that's tender to touch. Notes pain comes/goes that has been present for the past 2 weeks.  Denies SOB.  Pain does not seem related to her meals or activity level.  Denies recent injury.  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 Patient has had a hysterectomy.  Last pap see above.  Last mammogram: scheduled- last completed 82022     05/18/2022    2:05 PM  Depression screen PHQ 2/9  Decreased Interest 0  Down, Depressed, Hopeless 0  PHQ - 2 Score 0  Altered sleeping 0  Tired, decreased energy 0  Change in appetite 0  Feeling bad or failure about yourself  0  Trouble concentrating 0  Moving slowly or fidgety/restless 0  Suicidal thoughts 0  PHQ-9 Score 0      Review of Systems:   Pertinent items are noted in HPI Denies any headaches, blurred vision, fatigue, shortness of breath, chest pain, abdominal pain, bowel movements, urination, or intercourse unless otherwise stated above.  Pertinent History Reviewed:  Reviewed past medical,surgical, social and family history.  Reviewed problem list, medications and allergies. Physical Assessment:   Vitals:   05/18/22 1400  BP: 136/82  Pulse: 66  Weight: 277 lb (125.6 kg)  Height: '5\' 6"'$  (1.676 m)  Body mass index is 44.71 kg/m.        Physical Examination:   General appearance - well appearing, and in no distress  Mental status - alert, oriented to person, place, and time  Psych:  She has a normal mood and affect  Skin - warm and dry, normal color, no suspicious lesions noted  Chest - effort normal, all lung fields clear to auscultation bilaterally  Heart  - normal rate and regular rhythm  Neck:  midline trachea, no thyromegaly or nodules  Breasts - breasts appear normal, no suspicious masses, no skin or nipple changes or  axillary nodes, no reproducible pain or abnormalities noted on exam  Abdomen - obese, soft, nontender, nondistended, no masses or organomegaly  Pelvic - VULVA: normal appearing vulva with no masses, tenderness or lesions  VAGINA: normal appearing vagina with normal color and discharge, no lesions  CERVIX: normal appearing cervix without discharge or lesions, no CMT  Thin prep pap is done with HR HPV cotesting Uterus surgically absent, bimanual exam limited due to body habitus- no masses or abnormalities noted  Extremities:  No calf tenderness bilaterally  Chaperone:  pt declined      Assessment & Plan:  1) Well-Woman Exam, h/o CIN 2 -pap collected, further management pending results -mammogram scheduled  2) Chest pain -pt does not appear acutely ill -PCP appt on Wed -reviewed precautions- should she note SOB, worsening of symptoms or other acute change, pt to go to ER   Follow-up: Return in about 1 year (around 05/19/2023) for Annual.   Janyth Pupa, DO Attending [place], [place] for [place]

## 2022-05-20 ENCOUNTER — Ambulatory Visit: Payer: Self-pay | Admitting: Family Medicine

## 2022-05-22 LAB — CYTOLOGY - PAP
Chlamydia: NEGATIVE
Comment: NEGATIVE
Comment: NEGATIVE
Comment: NORMAL
Diagnosis: UNDETERMINED — AB
High risk HPV: NEGATIVE
Neisseria Gonorrhea: NEGATIVE

## 2022-05-27 ENCOUNTER — Ambulatory Visit (HOSPITAL_COMMUNITY)
Admission: RE | Admit: 2022-05-27 | Discharge: 2022-05-27 | Disposition: A | Discharge status: Home / Self Care | Payer: 59 | Source: Ambulatory Visit | Attending: Obstetrics and Gynecology | Admitting: Obstetrics and Gynecology

## 2022-05-27 DIAGNOSIS — R928 Other abnormal and inconclusive findings on diagnostic imaging of breast: Secondary | ICD-10-CM | POA: Insufficient documentation

## 2022-05-27 DIAGNOSIS — N644 Mastodynia: Secondary | ICD-10-CM | POA: Diagnosis not present

## 2022-06-01 ENCOUNTER — Ambulatory Visit: Payer: 59 | Admitting: Internal Medicine

## 2022-06-01 ENCOUNTER — Encounter: Payer: Self-pay | Admitting: Internal Medicine

## 2022-06-01 VITALS — BP 132/80 | HR 68 | Temp 98.8°F | Ht 66.0 in | Wt 276.0 lb

## 2022-06-01 DIAGNOSIS — I10 Essential (primary) hypertension: Secondary | ICD-10-CM

## 2022-06-01 DIAGNOSIS — E785 Hyperlipidemia, unspecified: Secondary | ICD-10-CM | POA: Insufficient documentation

## 2022-06-01 DIAGNOSIS — K219 Gastro-esophageal reflux disease without esophagitis: Secondary | ICD-10-CM

## 2022-06-01 DIAGNOSIS — G4700 Insomnia, unspecified: Secondary | ICD-10-CM

## 2022-06-01 DIAGNOSIS — E039 Hypothyroidism, unspecified: Secondary | ICD-10-CM | POA: Diagnosis not present

## 2022-06-01 MED ORDER — ATORVASTATIN CALCIUM 40 MG PO TABS: 40.0000 mg | ORAL_TABLET | Freq: Every day | ORAL | 5 refills | Status: DC

## 2022-06-01 MED ORDER — AMLODIPINE-OLMESARTAN 10-40 MG PO TABS: 1.0000 | ORAL_TABLET | Freq: Every day | ORAL | 3 refills | Status: DC

## 2022-06-01 MED ORDER — PANTOPRAZOLE SODIUM 40 MG PO TBEC: 40.0000 mg | DELAYED_RELEASE_TABLET | Freq: Two times a day (BID) | ORAL | 5 refills | Status: AC

## 2022-06-01 MED ORDER — LEVOTHYROXINE SODIUM 50 MCG PO TABS: 50.0000 ug | ORAL_TABLET | Freq: Every day | ORAL | 5 refills | Status: DC

## 2022-06-01 NOTE — Assessment & Plan Note (Signed)
Acute concern of insomnia. Patient has not been sleeping well for months, wakes up multiple times a night, and feels tired.   Assessment/Plan: Insomnia, discussed sleep routine. Patient has a high stop bang score suggestive of moderate to sever sleep apnea. Living with HTN. - Sleep Study

## 2022-06-01 NOTE — Progress Notes (Signed)
CC: Establish Care   HPI:[name] is a 56 y.o. female who presents to establish care with our clinic. She is living with HTN, GERD, hypothyroidism, and hyperlipidemia. 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 She has acute concern of insomnia.  For the details of today's visit, please refer to the assessment and plan.   Past Medical History:  Diagnosis Date   Complication of anesthesia    COPD (chronic obstructive pulmonary disease) ([place])    Depression    Hyperlipidemia    Hypertension    Hypothyroidism    Kidney stone    PONV (postoperative nausea and vomiting)    Stroke ([place]) 10/2017    Past Surgical History:  Procedure Laterality Date   ABDOMINAL HYSTERECTOMY     SUPRACERVICAL   CHOLECYSTECTOMY     LEEP N/A 05/06/2021   Procedure: LOOP ELECTROSURGICAL EXCISION PROCEDURE (LEEP);  Surgeon: Janyth Pupa, DO;  Location: [facility];  Service: Gynecology;  Laterality: N/A;   TUBAL LIGATION      Family History  Problem Relation Age of Onset   Diabetes Mother    Cancer Mother    Heart attack Father    Liver disease Father    Hypertension Daughter    Hypertension Daughter    Diabetes Sister    Hypertension Son     Social History   Tobacco Use   Smoking status: Former    Packs/day: 0.00    Years: 21.00    Total pack years: 0.00    Types: Cigarettes    Quit date: 11/06/2017    Years since quitting: 4.5   Smokeless tobacco: Never  Vaping Use   Vaping Use: Never used  Substance Use Topics   Alcohol use: Yes    Comment: occ   Drug use: No     Physical Exam: Vitals:   06/01/22 0919  BP: 132/80  Pulse: 68  Temp: 98.8 F (37.1 C)  SpO2: 96%  Weight: 276 lb (125.2 kg)  Height: '5\' 6"'$  (1.676 m)     Physical Exam Constitutional:      General: She is not in acute distress.    Appearance: She is obese. She is not ill-appearing.  Eyes:     General: No scleral icterus.    Conjunctiva/sclera: Conjunctivae normal.   Cardiovascular:     Rate and Rhythm: Normal rate and regular rhythm.     Heart sounds: No murmur heard.    No friction rub.  Pulmonary:     Effort: Pulmonary effort is normal.     Breath sounds: No wheezing, rhonchi or rales.  Psychiatric:        Mood and Affect: Mood normal.        Behavior: Behavior normal.      Assessment & Plan:   Hypertension  Patient's BP today is 132/80 with a goal of <140/80. The patient endorses adherence to lisinopril 20 mg, amlodipine 10 mg, and HCTZ 25 mg. She has dry mouth. Also takes potassium supplement.   Assessment/Plan: Chronic problem, controlled. Will switch regimen today given patient symptoms and history of low potassium. If uncontrolled with increase dose of ARB then will consider adding back on diuretic. Combining medication to decrease pill burden.  -Follow up in 1 month to check BP and BMP - amLODipine-olmesartan (AZOR) 10-40 MG tablet; Take 1 tablet by mouth daily.  Dispense: 90 tablet; Refill: 3 - Split night study  Insomnia Acute concern of insomnia. Patient  has not been sleeping well for months, wakes up multiple times a night, and feels tired.   Assessment/Plan: Insomnia, discussed sleep routine. Patient has a high stop bang score suggestive of moderate to sever sleep apnea. Living with HTN. - Sleep Study    HLD (hyperlipidemia) Chronic problem, controlled. Refilled - atorvastatin (LIPITOR) 40 MG tablet; Take 1 tablet (40 mg total) by mouth daily.  Dispense: 30 tablet; Refill: 5   GERD (gastroesophageal reflux disease) Chronic problem, long term use of PPI.  -Check Mg -refilled - pantoprazole (PROTONIX) 40 MG tablet; Take 1 tablet (40 mg total) by mouth 2 (two) times daily.  Dispense: 30 tablet; Refill: 5  Hypothyroidism Chronic problem, controlled.  -Check TSH - Refill levothyroxine 50 mcg    Lorene Dy, MD

## 2022-06-01 NOTE — Assessment & Plan Note (Signed)
Chronic problem, controlled. Refilled - atorvastatin (LIPITOR) 40 MG tablet; Take 1 tablet (40 mg total) by mouth daily.  Dispense: 30 tablet; Refill: 5

## 2022-06-01 NOTE — Assessment & Plan Note (Addendum)
Chronic problem, long term use of PPI.  -Check Mg -refilled - pantoprazole (PROTONIX) 40 MG tablet; Take 1 tablet (40 mg total) by mouth 2 (two) times daily.  Dispense: 30 tablet; Refill: 5

## 2022-06-01 NOTE — Assessment & Plan Note (Signed)
Chronic problem, controlled.  -Check TSH - Refill levothyroxine 50 mcg

## 2022-06-01 NOTE — Assessment & Plan Note (Addendum)
  Patient's BP today is 132/80 with a goal of <140/80. The patient endorses adherence to lisinopril 20 mg, amlodipine 10 mg, and HCTZ 25 mg. She has dry mouth. Also takes potassium supplement.   Assessment/Plan: Chronic problem, controlled. Will switch regimen today given patient symptoms and history of low potassium. If uncontrolled with increase dose of ARB then will consider adding back on diuretic. Combining medication to decrease pill burden.  -Follow up in 1 month to check BP and BMP - amLODipine-olmesartan (AZOR) 10-40 MG tablet; Take 1 tablet by mouth daily.  Dispense: 90 tablet; Refill: 3 - Split night study

## 2022-06-01 NOTE — Patient Instructions (Addendum)
Thank you for trusting me with your care. To recap, today we discussed the following:   Hypertension and Sleep difficulty  - Stop your other blood pressure medicine, and start medicine below - amLODipine-olmesartan (AZOR) 10-40 MG tablet; Take 1 tablet by mouth daily.  Dispense: 90 tablet; Refill: 3 - Split night study  Labs: - Hemoglobin A1C - BMP8+EGFR - Magnesium -TSH  Refills: - pantoprazole (PROTONIX) 40 MG tablet; Take 1 tablet (40 mg total) by mouth 2 (two) times daily.  Dispense: 30 tablet; Refill: 5 - levothyroxine (SYNTHROID) 50 MCG tablet; Take 1 tablet (50 mcg total) by mouth daily.  Dispense: 30 tablet; Refill: 5 - atorvastatin (LIPITOR) 40 MG tablet; Take 1 tablet (40 mg total) by mouth daily.  Dispense: 30 tablet; Refill: 5  Check on Wegovy. This is weight loss medication.

## 2022-06-02 LAB — BMP8+EGFR
BUN/Creatinine Ratio: 18 (ref 9–23)
BUN: 16 mg/dL (ref 6–24)
CO2: 23 mmol/L (ref 20–29)
Calcium: 10.4 mg/dL — ABNORMAL HIGH (ref 8.7–10.2)
Chloride: 102 mmol/L (ref 96–106)
Creatinine, Ser: 0.89 mg/dL (ref 0.57–1.00)
Glucose: 96 mg/dL (ref 70–99)
Potassium: 4 mmol/L (ref 3.5–5.2)
Sodium: 140 mmol/L (ref 134–144)
eGFR: 77 mL/min/{1.73_m2} (ref >59)

## 2022-06-02 LAB — HEMOGLOBIN A1C
Est. average glucose Bld gHb Est-mCnc: 120 mg/dL
Hgb A1c MFr Bld: 5.8 % — ABNORMAL HIGH (ref 4.8–5.6)

## 2022-06-02 LAB — MAGNESIUM: Magnesium: 2 mg/dL (ref 1.6–2.3)

## 2022-06-03 LAB — TSH: TSH: 3.71 u[IU]/mL (ref 0.450–4.500)

## 2022-06-24 ENCOUNTER — Other Ambulatory Visit: Payer: Self-pay | Admitting: Hematology and Oncology

## 2022-06-30 ENCOUNTER — Ambulatory Visit: Payer: 59 | Admitting: Family Medicine

## 2022-07-09 ENCOUNTER — Other Ambulatory Visit: Payer: Self-pay | Admitting: Hematology and Oncology

## 2022-07-10 ENCOUNTER — Emergency Department (HOSPITAL_COMMUNITY): Payer: 59

## 2022-07-10 ENCOUNTER — Emergency Department (HOSPITAL_COMMUNITY)
Admission: EM | Admit: 2022-07-10 | Discharge: 2022-07-10 | Disposition: A | Discharge status: Home / Self Care | Payer: 59 | Attending: Emergency Medicine | Admitting: Emergency Medicine

## 2022-07-10 DIAGNOSIS — Z7982 Long term (current) use of aspirin: Secondary | ICD-10-CM | POA: Insufficient documentation

## 2022-07-10 DIAGNOSIS — R200 Anesthesia of skin: Secondary | ICD-10-CM | POA: Diagnosis not present

## 2022-07-10 DIAGNOSIS — Z79899 Other long term (current) drug therapy: Secondary | ICD-10-CM | POA: Diagnosis not present

## 2022-07-10 DIAGNOSIS — M792 Neuralgia and neuritis, unspecified: Secondary | ICD-10-CM | POA: Diagnosis not present

## 2022-07-10 DIAGNOSIS — D649 Anemia, unspecified: Secondary | ICD-10-CM | POA: Diagnosis not present

## 2022-07-10 LAB — CBC WITH DIFFERENTIAL/PLATELET
Abs Immature Granulocytes: 0.01 10*3/uL (ref 0.00–0.07)
Basophils Absolute: 0.1 10*3/uL (ref 0.0–0.1)
Basophils Relative: 1 %
Eosinophils Absolute: 0.3 10*3/uL (ref 0.0–0.5)
Eosinophils Relative: 5 %
HCT: 34.8 % — ABNORMAL LOW (ref 36.0–46.0)
Hemoglobin: 11.5 g/dL — ABNORMAL LOW (ref 12.0–15.0)
Immature Granulocytes: 0 %
Lymphocytes Relative: 41 %
Lymphs Abs: 2.6 10*3/uL (ref 0.7–4.0)
MCH: 29.3 pg (ref 26.0–34.0)
MCHC: 33 g/dL (ref 30.0–36.0)
MCV: 88.5 fL (ref 80.0–100.0)
Monocytes Absolute: 0.5 10*3/uL (ref 0.1–1.0)
Monocytes Relative: 8 %
Neutro Abs: 2.8 10*3/uL (ref 1.7–7.7)
Neutrophils Relative %: 45 %
Platelets: 228 10*3/uL (ref 150–400)
RBC: 3.93 MIL/uL (ref 3.87–5.11)
RDW: 13.8 % (ref 11.5–15.5)
WBC: 6.2 10*3/uL (ref 4.0–10.5)
nRBC: 0 % (ref 0.0–0.2)

## 2022-07-10 LAB — BASIC METABOLIC PANEL
Anion gap: 9 (ref 5–15)
BUN: 24 mg/dL — ABNORMAL HIGH (ref 6–20)
CO2: 25 mmol/L (ref 22–32)
Calcium: 9.4 mg/dL (ref 8.9–10.3)
Chloride: 105 mmol/L (ref 98–111)
Creatinine, Ser: 0.75 mg/dL (ref 0.44–1.00)
GFR, Estimated: >60 mL/min (ref >60)
Glucose, Bld: 106 mg/dL — ABNORMAL HIGH (ref 70–99)
Potassium: 4 mmol/L (ref 3.5–5.1)
Sodium: 139 mmol/L (ref 135–145)

## 2022-07-10 NOTE — Discharge Instructions (Signed)
Take Tylenol or Motrin for the numbness and follow-up with your doctor if any problem

## 2022-07-10 NOTE — ED Triage Notes (Addendum)
Pt to ED c/o right side facial numbness that started at 3 am; Reports coming to hospital from work. No other neuro deficits noted in triage.  NIH 2

## 2022-07-10 NOTE — ED Provider Notes (Signed)
[facility] EMERGENCY DEPARTMENT Provider Note   CSN: [id number] Arrival date & time: 07/10/22  1104     History {Add pertinent medical, surgical, social history, OB history to HPI:1} Chief Complaint  Patient presents with   facial numbness    Barbara Snow is a 57 y.o. female.  Patient has a history of a stroke.  She states that she started with some facial numbness last night   Weakness      Home Medications Prior to Admission medications   Medication Sig Start Date End Date Taking? Authorizing Provider  amLODipine-olmesartan (AZOR) 10-40 MG tablet Take 1 tablet by mouth daily. 06/01/22   Lyndal Pulley, MD  aspirin EC 81 MG tablet Take 81 mg by mouth daily. Swallow whole.    [provider]  atorvastatin (LIPITOR) 40 MG tablet Take 1 tablet (40 mg total) by mouth daily. 06/01/22   Lyndal Pulley, MD  levothyroxine (SYNTHROID) 50 MCG tablet Take 1 tablet (50 mcg total) by mouth daily. 06/01/22   Lyndal Pulley, MD  pantoprazole (PROTONIX) 40 MG tablet Take 1 tablet (40 mg total) by mouth 2 (two) times daily. 06/01/22   Lyndal Pulley, MD  potassium chloride (MICRO-K) 10 MEQ CR capsule Take 1 capsule (10 mEq total) by mouth daily. 05/08/22   [name], NP  predniSONE (DELTASONE) 20 MG tablet Take 1 tablet (20 mg total) by mouth daily with breakfast. Patient not taking: Reported on 06/01/2022 05/08/22   [name], NP      Allergies    Patient has no known allergies.    Review of Systems   Review of Systems  Neurological:  Positive for weakness.    Physical Exam Updated Vital Signs BP 125/76   Pulse 64   Temp 97.9 F (36.6 C) (Oral)   Resp 17   Ht '5\' 6"'$  (1.676 m)   Wt 119.3 kg   SpO2 99%   BMI 42.45 kg/m  Physical Exam  ED Results / Procedures / Treatments   Labs (all labs ordered are listed, but only abnormal results are displayed) Labs Reviewed  CBC WITH DIFFERENTIAL/PLATELET - Abnormal; Notable for the following components:       Result Value   Hemoglobin 11.5 (*)    HCT 34.8 (*)    All other components within normal limits  BASIC METABOLIC PANEL - Abnormal; Notable for the following components:   Glucose, Bld 106 (*)    BUN 24 (*)    All other components within normal limits    EKG None  Radiology MR BRAIN WO CONTRAST  Result Date: 07/10/2022 CLINICAL DATA:  Right-sided facial numbness. EXAM: MRI HEAD WITHOUT CONTRAST TECHNIQUE: Multiplanar, multiecho pulse sequences of the brain and surrounding structures were obtained without intravenous contrast. COMPARISON:  MRI Brain 11/07/17 FINDINGS: Brain: No acute infarction, hemorrhage, hydrocephalus, extra-axial collection or mass lesion. Sequela of mild chronic microvascular ischemic change. Chronic left basal ganglia infarct. 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 Vascular: Normal flow voids. Skull and upper cervical spine: Normal marrow signal. Sinuses/Orbits: Negative. Other: None. IMPRESSION: No acute intracranial process. Electronically Signed   By: Marin Roberts M.D.   On: 07/10/2022 12:52    Procedures Procedures  {Document cardiac monitor, telemetry assessment procedure when appropriate:1}  Medications Ordered in ED Medications - No data to display  ED Course/ Medical Decision Making/ A&P   {   Click here for ABCD2, HEART and other calculatorsREFRESH Note before signing :1}  Medical Decision Making Amount and/or Complexity of Data Reviewed Labs: ordered. Radiology: ordered.  Patient with some peripheral neuritis.  MRI shows no acute stroke.  She will be discharged home and take Tylenol or Motrin and follow-up with PCP  {Document critical care time when appropriate:1} {Document review of labs and clinical decision tools ie heart score, Chads2Vasc2 etc:1}  {Document your independent review of radiology images, and any outside records:1} {Document your discussion with family members, caretakers, and with consultants:1} {Document  social determinants of health affecting pt's care:1} {Document your decision making why or why not admission, treatments were needed:1} Final Clinical Impression(s) / ED Diagnoses Final diagnoses:  Neuritis    Rx / DC Orders ED Discharge Orders     None

## 2022-07-13 ENCOUNTER — Encounter: Payer: Self-pay | Admitting: Family Medicine

## 2022-07-13 ENCOUNTER — Ambulatory Visit: Payer: 59 | Admitting: Family Medicine

## 2022-07-13 VITALS — BP 135/88 | HR 87 | Ht 67.0 in | Wt 279.0 lb

## 2022-07-13 DIAGNOSIS — I10 Essential (primary) hypertension: Secondary | ICD-10-CM

## 2022-07-13 DIAGNOSIS — Z23 Encounter for immunization: Secondary | ICD-10-CM

## 2022-07-13 DIAGNOSIS — Z1211 Encounter for screening for malignant neoplasm of colon: Secondary | ICD-10-CM

## 2022-07-13 DIAGNOSIS — G459 Transient cerebral ischemic attack, unspecified: Secondary | ICD-10-CM

## 2022-07-13 DIAGNOSIS — G629 Polyneuropathy, unspecified: Secondary | ICD-10-CM

## 2022-07-13 DIAGNOSIS — R2990 Unspecified symptoms and signs involving the nervous system: Secondary | ICD-10-CM | POA: Diagnosis not present

## 2022-07-13 MED ORDER — GABAPENTIN 100 MG PO CAPS: 100.0000 mg | ORAL_CAPSULE | Freq: Every day | ORAL | 3 refills | Status: DC

## 2022-07-13 NOTE — Assessment & Plan Note (Addendum)
Reports history of stroke 2 years ago with residual right sided weakness Decreased handgrip in the right hand She takes baby aspirin daily She complains of facial numbness with numbness in her hands bilaterally  She was seen in the ED on 07/10/2022 for neuritis Imaging studies were unremarkable with no evidence of stroke Given patient's past history of CVA, will refer the patient to neurology for further evaluation Will treat neuritis with gabapentin 100 mg nightly  Encourage patient to increase her physical activities and fluid consumption Hemoglobin 11.5 and chemistries unremarkable in the ED on 07/10/2022

## 2022-07-13 NOTE — Patient Instructions (Addendum)
I appreciate the opportunity to provide care to you today!    Follow up:  3 months   Physical activity helps: Lower your blood glucose, improve your heart health, lower your blood pressure and cholesterol, burn calories to help manage her weight, gave you energy, lower stress, and improve his sleep.  The American diabetes Association (ADA) recommends being active for 2-1/2 hours (150 minutes) or more week.  Exercise for 30 minutes, 5 days a week (150 minutes total)   Referrals today- Neurology   Please continue to a heart-healthy diet and increase your physical activities. Try to exercise for 34mns at least five times a week.      It was a pleasure to see you and I look forward to continuing to work together on your health and well-being. Please do not hesitate to call the office if you need care or have questions about your care.   Have a wonderful day and week. With Gratitude, GAlvira MondayMSN, FNP-BC

## 2022-07-13 NOTE — Progress Notes (Signed)
Established Patient Office Visit  Subjective:  Patient ID: Barbara Snow, female    DOB: [birth year]  Age: 57 y.o. MRN: [id number]  CC:  Chief Complaint  Patient presents with   Follow-up    Saw [facility] last, needs refills, pt reports feeling fatigued, went to Ed on 07/10/22 due to numbness on the right side of her body. Needs a work note to go back to work has been out since 07/10/2022.     HPI Barbara Snow is a 57 y.o. female with past medical history of TIA, hypertension, COPD, GERD, and hyperlipidemia presents for f/u of  chronic medical conditions. For the details of today's visit, please refer to the assessment and plan.     Past Medical History:  Diagnosis Date   Complication of anesthesia    COPD (chronic obstructive pulmonary disease) ([place])    Depression    Hyperlipidemia    Hypertension    Hypothyroidism    Kidney stone    PONV (postoperative nausea and vomiting)    Stroke ([place]) 10/2017    Past Surgical History:  Procedure Laterality Date   ABDOMINAL HYSTERECTOMY     SUPRACERVICAL   CHOLECYSTECTOMY     LEEP N/A 05/06/2021   Procedure: LOOP ELECTROSURGICAL EXCISION PROCEDURE (LEEP);  Surgeon: Janyth Pupa, DO;  Location: [facility];  Service: Gynecology;  Laterality: N/A;   TUBAL LIGATION      Family History  Problem Relation Age of Onset   Diabetes Mother    Cancer Mother    Heart attack Father    Liver disease Father    Hypertension Daughter    Hypertension Daughter    Diabetes Sister    Hypertension Son     Social History   Socioeconomic History   Marital status: Single    Spouse name: Not on file   Number of children: 3   Years of education: Not on file   Highest education level: High school graduate  Occupational History   Not on file  Tobacco Use   Smoking status: Former    Packs/day: 0.00    Years: 21.00    Total pack years: 0.00    Types: Cigarettes    Quit date: 11/06/2017    Years since quitting: 4.6   Smokeless tobacco: Never   Vaping Use   Vaping Use: Never used  Substance and Sexual Activity   Alcohol use: Yes    Comment: occ   Drug use: No   Sexual activity: Not Currently    Birth control/protection: Post-menopausal  Other Topics Concern   Not on file  Social History Narrative   Not on file   Social Determinants of Health   Financial Resource Strain: Medium Risk (05/18/2022)   Overall Financial Resource Strain (CARDIA)    Difficulty of Paying Living Expenses: Somewhat hard  Food Insecurity: Food Insecurity Present (05/18/2022)   Hunger Vital Sign    Worried About Running Out of Food in the Last Year: Sometimes true    Ran Out of Food in the Last Year: Sometimes true  Transportation Needs: Unmet Transportation Needs (05/18/2022)   PRAPARE - Transportation    Lack of Transportation (Medical): Yes    Lack of Transportation (Non-Medical): Yes  Physical Activity: Insufficiently Active (05/18/2022)   Exercise Vital Sign    Days of Exercise per Week: 3 days    Minutes of Exercise per Session: 20 min  Stress: Stress Concern Present (05/18/2022)   [employer] [place]  Feeling of Stress : To some extent  Social Connections: Unknown (05/18/2022)   Social Connection and Isolation Panel [NHANES]    Frequency of Communication with Friends and Family: More than three times a week    Frequency of Social Gatherings with Friends and Family: More than three times a week    Attends Religious Services: More than 4 times per year    Active Member of [employer] or Organizations: No    Attends Archivist Meetings: Never    Marital Status: Patient refused  Intimate Partner Violence: Not At Risk (05/18/2022)   Humiliation, Afraid, Rape, and Kick questionnaire    Fear of Current or Ex-Partner: No    Emotionally Abused: No    Physically Abused: No    Sexually Abused: No    Outpatient Medications Prior to Visit  Medication Sig Dispense Refill    amLODipine-olmesartan (AZOR) 10-40 MG tablet Take 1 tablet by mouth daily. 90 tablet 3   aspirin EC 81 MG tablet Take 81 mg by mouth daily. Swallow whole.     atorvastatin (LIPITOR) 40 MG tablet Take 1 tablet (40 mg total) by mouth daily. 30 tablet 5   levothyroxine (SYNTHROID) 50 MCG tablet Take 1 tablet (50 mcg total) by mouth daily. 30 tablet 5   pantoprazole (PROTONIX) 40 MG tablet Take 1 tablet (40 mg total) by mouth 2 (two) times daily. 30 tablet 5   potassium chloride (MICRO-K) 10 MEQ CR capsule Take 1 capsule (10 mEq total) by mouth daily. (Patient not taking: Reported on 07/13/2022) 14 capsule 0   predniSONE (DELTASONE) 20 MG tablet Take 1 tablet (20 mg total) by mouth daily with breakfast. (Patient not taking: Reported on 06/01/2022) 14 tablet 0   No facility-administered medications prior to visit.    No Known Allergies  ROS Review of Systems  Constitutional:  Negative for chills and fever.  HENT:         Right-sided facial numbness Numbness in her hands   Eyes:  Negative for visual disturbance.  Respiratory:  Negative for chest tightness and shortness of breath.   Neurological:  Positive for dizziness and numbness. Negative for headaches.      Objective:    Physical Exam HENT:     Head: Normocephalic.     Comments: Minimal numbness on the right side of her face    Mouth/Throat:     Mouth: Mucous membranes are moist.  Cardiovascular:     Rate and Rhythm: Normal rate.     Heart sounds: Normal heart sounds.  Pulmonary:     Effort: Pulmonary effort is normal.     Breath sounds: Normal breath sounds.  Neurological:     Mental Status: She is alert and oriented to person, place, and time.     Cranial Nerves: No facial asymmetry.     Motor: No weakness.     Gait: Gait normal.     Comments: Decreased handgrip in the right hand      BP 135/88   Pulse 87   Ht '5\' 7"'$  (1.702 m)   Wt 279 lb (126.6 kg)   SpO2 95%   BMI 43.70 kg/m  Wt Readings from Last 3 Encounters:   07/13/22 279 lb (126.6 kg)  07/10/22 263 lb (119.3 kg)  06/01/22 276 lb (125.2 kg)    Lab Results  Component Value Date   TSH 3.710 06/01/2022   Lab Results  Component Value Date   WBC 6.2 07/10/2022   HGB 11.5 (L) 07/10/2022  HCT 34.8 (L) 07/10/2022   MCV 88.5 07/10/2022   PLT 228 07/10/2022   Lab Results  Component Value Date   NA 139 07/10/2022   K 4.0 07/10/2022   CO2 25 07/10/2022   GLUCOSE 106 (H) 07/10/2022   BUN 24 (H) 07/10/2022   CREATININE 0.75 07/10/2022   BILITOT 1.1 12/09/2021   ALKPHOS 103 12/09/2021   AST 19 12/09/2021   ALT 24 12/09/2021   PROT 8.2 (H) 12/09/2021   ALBUMIN 3.9 12/09/2021   CALCIUM 9.4 07/10/2022   ANIONGAP 9 07/10/2022   EGFR 77 06/01/2022   Lab Results  Component Value Date   CHOL 124 12/09/2021   Lab Results  Component Value Date   HDL 46 12/09/2021   Lab Results  Component Value Date   LDLCALC 56 12/09/2021   Lab Results  Component Value Date   TRIG 108 12/09/2021   Lab Results  Component Value Date   CHOLHDL 2.7 12/09/2021   Lab Results  Component Value Date   HGBA1C 5.8 (H) 06/01/2022      Assessment & Plan:  Primary hypertension Assessment & Plan: Controlled She complains of dizziness, denies visual disturbances, chest pain, palpitation Encouraged to increase her fluid consumption, at least 64 ounces of fluids daily Encouraged to continue taking amlodipine olmesartan 10-'40mg'$  daily BP Readings from Last 3 Encounters:  07/13/22 135/88  07/10/22 (!) 136/90  06/01/22 132/80      TIA (transient ischemic attack) Assessment & Plan: Reports history of stroke 2 years ago with residual right sided weakness Decreased handgrip in the right hand She takes baby aspirin daily She complains of facial numbness with numbness in her hands bilaterally  She was seen in the ED on 07/10/2022 for neuritis Imaging studies were unremarkable with no evidence of stroke Given patient's past history of CVA, will refer the  patient to neurology for further evaluation Will treat neuritis with gabapentin 100 mg nightly  Encourage patient to increase her physical activities and fluid consumption Hemoglobin 11.5 and chemistries unremarkable in the ED on 07/10/2022   Stroke-like symptoms -     Ambulatory referral to Neurology  Peripheral neuritis -     Gabapentin; Take 1 capsule (100 mg total) by mouth at bedtime.  Dispense: 90 capsule; Refill: 3  Colon cancer screening -     Fecal occult blood, imunochemical  Immunization due    Follow-up: Return in about 3 months (around 10/12/2022).   Alvira Monday, FNP

## 2022-07-13 NOTE — Assessment & Plan Note (Signed)
Controlled She complains of dizziness, denies visual disturbances, chest pain, palpitation Encouraged to increase her fluid consumption, at least 64 ounces of fluids daily Encouraged to continue taking amlodipine olmesartan 10-'40mg'$  daily BP Readings from Last 3 Encounters:  07/13/22 135/88  07/10/22 (!) 136/90  06/01/22 132/80

## 2022-07-18 ENCOUNTER — Other Ambulatory Visit: Payer: Self-pay | Admitting: Hematology and Oncology

## 2022-08-14 ENCOUNTER — Ambulatory Visit: Payer: 59 | Admitting: Neurology

## 2022-08-27 ENCOUNTER — Encounter: Payer: Self-pay | Admitting: Radiology

## 2022-09-10 ENCOUNTER — Ambulatory Visit: Payer: 59 | Admitting: Neurology

## 2022-09-16 ENCOUNTER — Other Ambulatory Visit: Payer: Self-pay

## 2022-09-16 ENCOUNTER — Emergency Department (HOSPITAL_COMMUNITY): Payer: 59

## 2022-09-16 ENCOUNTER — Encounter (HOSPITAL_COMMUNITY): Payer: Self-pay

## 2022-09-16 ENCOUNTER — Emergency Department (HOSPITAL_COMMUNITY)
Admission: EM | Admit: 2022-09-16 | Discharge: 2022-09-16 | Disposition: A | Discharge status: Home / Self Care | Payer: 59 | Attending: Emergency Medicine | Admitting: Emergency Medicine

## 2022-09-16 DIAGNOSIS — Z79899 Other long term (current) drug therapy: Secondary | ICD-10-CM | POA: Diagnosis not present

## 2022-09-16 DIAGNOSIS — R059 Cough, unspecified: Secondary | ICD-10-CM | POA: Diagnosis present

## 2022-09-16 DIAGNOSIS — J209 Acute bronchitis, unspecified: Secondary | ICD-10-CM | POA: Insufficient documentation

## 2022-09-16 DIAGNOSIS — E039 Hypothyroidism, unspecified: Secondary | ICD-10-CM | POA: Diagnosis not present

## 2022-09-16 DIAGNOSIS — Z7982 Long term (current) use of aspirin: Secondary | ICD-10-CM | POA: Insufficient documentation

## 2022-09-16 DIAGNOSIS — J449 Chronic obstructive pulmonary disease, unspecified: Secondary | ICD-10-CM | POA: Insufficient documentation

## 2022-09-16 MED ORDER — PREDNISONE 50 MG PO TABS
50.0000 mg | ORAL_TABLET | Freq: Once | ORAL | Status: AC
  Administered 2022-09-16: 50 mg via ORAL
  Filled 2022-09-16: qty 1

## 2022-09-16 MED ORDER — IPRATROPIUM-ALBUTEROL 0.5-2.5 (3) MG/3ML IN SOLN
3.0000 mL | Freq: Once | RESPIRATORY_TRACT | Status: AC
  Administered 2022-09-16: 3 mL via RESPIRATORY_TRACT
  Filled 2022-09-16: qty 3

## 2022-09-16 MED ORDER — BENZONATATE 100 MG PO CAPS: 100.0000 mg | ORAL_CAPSULE | Freq: Three times a day (TID) | ORAL | 0 refills | Status: DC

## 2022-09-16 MED ORDER — PREDNISONE 50 MG PO TABS: 50.0000 mg | ORAL_TABLET | Freq: Every day | ORAL | 0 refills | Status: DC

## 2022-09-16 MED ORDER — BENZONATATE 100 MG PO CAPS
200.0000 mg | ORAL_CAPSULE | Freq: Once | ORAL | Status: AC
  Administered 2022-09-16: 200 mg via ORAL
  Filled 2022-09-16: qty 2

## 2022-09-16 MED ORDER — ALBUTEROL SULFATE HFA 108 (90 BASE) MCG/ACT IN AERS: 1.0000 | INHALATION_SPRAY | Freq: Four times a day (QID) | RESPIRATORY_TRACT | 0 refills | Status: AC | PRN

## 2022-09-16 NOTE — ED Provider Notes (Signed)
[facility] Provider Note   CSN: [id number] Arrival date & time: 09/16/22  [id number]     History  Chief Complaint  Patient presents with   Cough    Barbara Snow is a 57 y.o. female.   Cough    Patient has history of hypertension COPD hyperlipidemia stroke, hypothyroidism.  Patient presents to the ED with complaints of cough.  Patient works at a group home.  Patient thinks she was exposed to an illness from other residents.  She started having cough for the last couple of days.  She feels mucus in her chest.  She has had some runny nose.  No fevers.  She tested negative for COVID twice since the symptoms started.  Patient does not feel any better over the last 5 days.  She continues to have very severe persistent cough  Home Medications Prior to Admission medications   Medication Sig Start Date End Date Taking? Authorizing Provider  albuterol (VENTOLIN HFA) 108 (90 Base) MCG/ACT inhaler Inhale 1-2 puffs into the lungs every 6 (six) hours as needed for wheezing or shortness of breath. 09/16/22  Yes Dorie Rank, MD  amLODipine-olmesartan (AZOR) 10-40 MG tablet Take 1 tablet by mouth daily. 06/01/22  Yes Lyndal Pulley, MD  aspirin EC 81 MG tablet Take 81 mg by mouth daily. Swallow whole.   Yes [provider]  atorvastatin (LIPITOR) 40 MG tablet Take 1 tablet (40 mg total) by mouth daily. 06/01/22  Yes Lyndal Pulley, MD  benzonatate (TESSALON) 100 MG capsule Take 1 capsule (100 mg total) by mouth every 8 (eight) hours. 09/16/22  Yes Dorie Rank, MD  gabapentin (NEURONTIN) 100 MG capsule Take 1 capsule (100 mg total) by mouth at bedtime. 07/13/22  Yes Alvira Monday, FNP  levothyroxine (SYNTHROID) 50 MCG tablet Take 1 tablet (50 mcg total) by mouth daily. 06/01/22  Yes Lyndal Pulley, MD  predniSONE (DELTASONE) 50 MG tablet Take 1 tablet (50 mg total) by mouth daily. 09/16/22  Yes Dorie Rank, MD  pantoprazole (PROTONIX) 40 MG tablet Take 1  tablet (40 mg total) by mouth 2 (two) times daily. Patient not taking: Reported on 09/16/2022 06/01/22   Lyndal Pulley, MD      Allergies    Patient has no known allergies.    Review of Systems   Review of Systems  Respiratory:  Positive for cough.     Physical Exam Updated Vital Signs BP (!) 162/84 (BP Location: Left Arm)   Pulse 91   Temp 98.3 F (36.8 C) (Oral)   Resp 20   Ht 1.676 m (5\' 6" )   Wt 122.5 kg   SpO2 97%   BMI 43.58 kg/m  Physical Exam Vitals and nursing note reviewed.  Constitutional:      General: She is not in acute distress.    Appearance: She is well-developed.  HENT:     Head: Normocephalic and atraumatic.     Right Ear: External ear normal.     Left Ear: External ear normal.  Eyes:     General: No scleral icterus.       Right eye: No discharge.        Left eye: No discharge.     Conjunctiva/sclera: Conjunctivae normal.  Neck:     Trachea: No tracheal deviation.  Cardiovascular:     Rate and Rhythm: Normal rate and regular rhythm.  Pulmonary:     Effort: Pulmonary effort is normal. No respiratory  distress.     Breath sounds: No stridor. Wheezing present. No rales.     Comments: Frequently coughing Abdominal:     General: Bowel sounds are normal. There is no distension.     Palpations: Abdomen is soft.     Tenderness: There is no abdominal tenderness. There is no guarding or rebound.  Musculoskeletal:        General: No swelling, tenderness or deformity.     Cervical back: Neck supple.     Right lower leg: No edema.     Left lower leg: No edema.  Skin:    General: Skin is warm and dry.     Findings: No rash.  Neurological:     General: No focal deficit present.     Mental Status: She is alert.     Cranial Nerves: No cranial nerve deficit, dysarthria or facial asymmetry.     Sensory: No sensory deficit.     Motor: No abnormal muscle tone or seizure activity.     Coordination: Coordination normal.  Psychiatric:        Mood and Affect:  Mood normal.     ED Results / Procedures / Treatments   Labs (all labs ordered are listed, but only abnormal results are displayed) Labs Reviewed - No data to display  EKG None  Radiology DG Chest 2 View  Result Date: 09/16/2022 CLINICAL DATA:  Cough and wheezing EXAM: CHEST - 2 VIEW COMPARISON:  11/06/2017 FINDINGS: The heart size and mediastinal contours are within normal limits. Both lungs are clear. The visualized skeletal structures are unremarkable. IMPRESSION: No active cardiopulmonary disease. Electronically Signed   By: Inez Catalina M.D.   On: 09/16/2022 10:27    Procedures Procedures    Medications Ordered in ED Medications  benzonatate (TESSALON) capsule 200 mg (has no administration in time range)  predniSONE (DELTASONE) tablet 50 mg (50 mg Oral Given 09/16/22 0950)  ipratropium-albuterol (DUONEB) 0.5-2.5 (3) MG/3ML nebulizer solution 3 mL (3 mLs Nebulization Given 09/16/22 1031)    ED Course/ Medical Decision Making/ A&P Clinical Course as of 09/16/22 1105  Wed Sep 16, 2022  1040 X-ray without acute findings [JK]    Clinical Course User Index [JK] Dorie Rank, MD                             Medical Decision Making Differential diagnosis includes but not limited to viral illness, pneumonia, bronchitis  Problems Addressed: Acute bronchitis, unspecified organism: acute illness or injury that poses a threat to life or bodily functions  Amount and/or Complexity of Data Reviewed Radiology: ordered.  Risk Prescription drug management.   Patient presents ED with complaints of cough congestion.  She tested negative for COVID.  Patient with notable wheezing on exam.  Denies any smoking history or asthma.  Suspect viral bronchitis URI.  Patient was treated nebulizer treatment.  Some improvement although she continues to cough.  Breathing easily no oxygen requirement.  Will discharge home with a prescription for an albuterol inhaler as well as prednisone.  Will also  give her Tessalon to help with her cough.  Recommend outpatient follow-up if not improving by next week        Final Clinical Impression(s) / ED Diagnoses Final diagnoses:  Acute bronchitis, unspecified organism    Rx / DC Orders ED Discharge Orders          Ordered    benzonatate (TESSALON) 100 MG capsule  Every  8 hours        09/16/22 1103    albuterol (VENTOLIN HFA) 108 (90 Base) MCG/ACT inhaler  Every 6 hours PRN        09/16/22 1103    predniSONE (DELTASONE) 50 MG tablet  Daily        09/16/22 1103              Dorie Rank, MD 09/16/22 1105

## 2022-09-16 NOTE — Discharge Instructions (Signed)
Take the medications as prescribed to help with your cough.  Follow-up with your doctor to be rechecked if the symptoms have not resolved by next week.  Return to the ER for high fever shortness of breath or other concerning symptoms

## 2022-09-16 NOTE — ED Triage Notes (Signed)
Pt states she has had cough, wheezing, nasal congestion, runny nose x 5 days.  States she isn't getting any better. States she tested negative for covid twice since symptoms started.

## 2022-10-16 ENCOUNTER — Ambulatory Visit: Payer: 59 | Admitting: Family Medicine

## 2022-10-20 ENCOUNTER — Ambulatory Visit: Payer: 59 | Admitting: Family Medicine

## 2022-10-21 ENCOUNTER — Encounter: Payer: Self-pay | Admitting: Neurology

## 2022-10-21 ENCOUNTER — Ambulatory Visit: Payer: 59 | Admitting: Neurology

## 2022-11-02 ENCOUNTER — Ambulatory Visit: Payer: 59 | Admitting: Family Medicine

## 2022-11-12 ENCOUNTER — Ambulatory Visit: Payer: 59 | Admitting: Family Medicine

## 2022-11-27 ENCOUNTER — Encounter: Payer: Self-pay | Admitting: Family Medicine

## 2022-11-27 ENCOUNTER — Ambulatory Visit (INDEPENDENT_AMBULATORY_CARE_PROVIDER_SITE_OTHER): Payer: 59 | Admitting: Family Medicine

## 2022-11-27 VITALS — BP 138/86 | HR 85 | Ht 66.0 in | Wt 222.1 lb

## 2022-11-27 DIAGNOSIS — E559 Vitamin D deficiency, unspecified: Secondary | ICD-10-CM

## 2022-11-27 DIAGNOSIS — E7849 Other hyperlipidemia: Secondary | ICD-10-CM

## 2022-11-27 DIAGNOSIS — E038 Other specified hypothyroidism: Secondary | ICD-10-CM

## 2022-11-27 DIAGNOSIS — E0789 Other specified disorders of thyroid: Secondary | ICD-10-CM

## 2022-11-27 DIAGNOSIS — K219 Gastro-esophageal reflux disease without esophagitis: Secondary | ICD-10-CM | POA: Diagnosis not present

## 2022-11-27 DIAGNOSIS — I10 Essential (primary) hypertension: Secondary | ICD-10-CM | POA: Diagnosis not present

## 2022-11-27 DIAGNOSIS — R7301 Impaired fasting glucose: Secondary | ICD-10-CM

## 2022-11-27 NOTE — Patient Instructions (Addendum)
I appreciate the opportunity to provide care to you today!    Follow up:  3 months  Labs: please stop by the lab during the week to get your blood drawn (CBC, CMP, TSH, Lipid profile, HgA1c, Vit D)                            Keep up the good work!   Please continue to a heart-healthy diet and increase your physical activities. Try to exercise for at least five days a week.      It was a pleasure to see you and I look forward to continuing to work together on your health and well-being. Please do not hesitate to call the office if you need care or have questions about your care.   Have a wonderful day and week. With Gratitude, Gilmore Laroche MSN, FNP-BC

## 2022-11-27 NOTE — Assessment & Plan Note (Signed)
Controlled Denies visual disturbances, chest pain, palpitation Encouraged to increase her fluid consumption, at least 64 ounces of fluids daily Encouraged to continue taking amlodipine olmesartan 10-40mg  daily BP Readings from Last 3 Encounters:  11/27/22 138/86  09/16/22 (!) 162/84  07/13/22 135/88

## 2022-11-27 NOTE — Assessment & Plan Note (Signed)
She takes Synthroid 50 mcg daily Reports compliance with treatment regimen No complaints or concerns voiced Lab Results  Component Value Date   TSH 3.710 06/01/2022

## 2022-11-27 NOTE — Progress Notes (Signed)
Established Patient Office Visit  Subjective:  Patient ID: Barbara Snow, female    DOB: Jan 16, 1966  Age: 57 y.o. MRN: [id number]  CC:  Chief Complaint  Patient presents with   Chronic Care Management    3 month f/u, pt has been working on her health has lost 58 lbs since last visit. Pt would like a refill on fluid pill.     HPI Barbara Snow is a 57 y.o. female with past medical history of hypertension, hyperlipidemia, and hypothyroidism presents for f/u of  chronic medical conditions. For the details of today's visit, please refer to the assessment and plan.     Past Medical History:  Diagnosis Date   Complication of anesthesia    COPD (chronic obstructive pulmonary disease) (HCC)    Depression    Hyperlipidemia    Hypertension    Hypothyroidism    Kidney stone    PONV (postoperative nausea and vomiting)    Stroke (HCC) 10/2017    Past Surgical History:  Procedure Laterality Date   ABDOMINAL HYSTERECTOMY     SUPRACERVICAL   CHOLECYSTECTOMY     LEEP N/A 05/06/2021   Procedure: LOOP ELECTROSURGICAL EXCISION PROCEDURE (LEEP);  Surgeon: Myna Hidalgo, DO;  Location: [facility];  Service: Gynecology;  Laterality: N/A;   TUBAL LIGATION      Family History  Problem Relation Age of Onset   Diabetes Mother    Cancer Mother    Heart attack Father    Liver disease Father    Hypertension Daughter    Hypertension Daughter    Diabetes Sister    Hypertension Son     Social History   Socioeconomic History   Marital status: Single    Spouse name: Not on file   Number of children: 3   Years of education: Not on file   Highest education level: High school graduate  Occupational History   Not on file  Tobacco Use   Smoking status: Former    Packs/day: 0.00    Years: 21.00    Additional pack years: 0.00    Total pack years: 0.00    Types: Cigarettes    Quit date: 11/06/2017    Years since quitting: 5.0   Smokeless tobacco: Never  Vaping Use   Vaping Use: Never used   Substance and Sexual Activity   Alcohol use: Yes    Comment: occ   Drug use: No   Sexual activity: Not Currently    Birth control/protection: Post-menopausal  Other Topics Concern   Not on file  Social History Narrative   Not on file   Social Determinants of Health   Financial Resource Strain: Medium Risk (05/18/2022)   Overall Financial Resource Strain (CARDIA)    Difficulty of Paying Living Expenses: Somewhat hard  Food Insecurity: Food Insecurity Present (05/18/2022)   Hunger Vital Sign    Worried About Running Out of Food in the Last Year: Sometimes true    Ran Out of Food in the Last Year: Sometimes true  Transportation Needs: Unmet Transportation Needs (05/18/2022)   PRAPARE - Transportation    Lack of Transportation (Medical): Yes    Lack of Transportation (Non-Medical): Yes  Physical Activity: Insufficiently Active (05/18/2022)   Exercise Vital Sign    Days of Exercise per Week: 3 days    Minutes of Exercise per Session: 20 min  Stress: Stress Concern Present (05/18/2022)   [employer] of Occupational Health - Occupational Stress Questionnaire    Feeling of Stress :  To some extent  Social Connections: Unknown (05/18/2022)   Social Connection and Isolation Panel [NHANES]    Frequency of Communication with Friends and Family: More than three times a week    Frequency of Social Gatherings with Friends and Family: More than three times a week    Attends Religious Services: More than 4 times per year    Active Member of [employer] or Organizations: No    Attends Banker Meetings: Never    Marital Status: Patient declined  Catering manager Violence: Not At Risk (05/18/2022)   Humiliation, Afraid, Rape, and Kick questionnaire    Fear of Current or Ex-Partner: No    Emotionally Abused: No    Physically Abused: No    Sexually Abused: No    Outpatient Medications Prior to Visit  Medication Sig Dispense Refill   albuterol (VENTOLIN HFA) 108 (90 Base)  MCG/ACT inhaler Inhale 1-2 puffs into the lungs every 6 (six) hours as needed for wheezing or shortness of breath. 8 g 0   amLODipine-olmesartan (AZOR) 10-40 MG tablet Take 1 tablet by mouth daily. 90 tablet 3   aspirin EC 81 MG tablet Take 81 mg by mouth daily. Swallow whole.     atorvastatin (LIPITOR) 40 MG tablet Take 1 tablet (40 mg total) by mouth daily. 30 tablet 5   gabapentin (NEURONTIN) 100 MG capsule Take 1 capsule (100 mg total) by mouth at bedtime. 90 capsule 3   levothyroxine (SYNTHROID) 50 MCG tablet Take 1 tablet (50 mcg total) by mouth daily. 30 tablet 5   pantoprazole (PROTONIX) 40 MG tablet Take 1 tablet (40 mg total) by mouth 2 (two) times daily. 30 tablet 5   benzonatate (TESSALON) 100 MG capsule Take 1 capsule (100 mg total) by mouth every 8 (eight) hours. 21 capsule 0   predniSONE (DELTASONE) 50 MG tablet Take 1 tablet (50 mg total) by mouth daily. 5 tablet 0   No facility-administered medications prior to visit.    No Known Allergies  ROS Review of Systems  Constitutional:  Negative for chills and fever.  Eyes:  Negative for visual disturbance.  Respiratory:  Negative for chest tightness and shortness of breath.   Neurological:  Negative for dizziness and headaches.      Objective:    Physical Exam HENT:     Head: Normocephalic.     Mouth/Throat:     Mouth: Mucous membranes are moist.  Cardiovascular:     Rate and Rhythm: Normal rate.     Heart sounds: Normal heart sounds.  Pulmonary:     Effort: Pulmonary effort is normal.     Breath sounds: Normal breath sounds.  Neurological:     Mental Status: She is alert.     BP 138/86 (BP Location: Left Arm)   Pulse 85   Ht 5\' 6"  (1.676 m)   Wt 222 lb 1.9 oz (100.8 kg)   SpO2 97%   BMI 35.85 kg/m  Wt Readings from Last 3 Encounters:  11/27/22 222 lb 1.9 oz (100.8 kg)  09/16/22 270 lb (122.5 kg)  07/13/22 279 lb (126.6 kg)    Lab Results  Component Value Date   TSH 3.710 06/01/2022   Lab Results   Component Value Date   WBC 6.2 07/10/2022   HGB 11.5 (L) 07/10/2022   HCT 34.8 (L) 07/10/2022   MCV 88.5 07/10/2022   PLT 228 07/10/2022   Lab Results  Component Value Date   NA 139 07/10/2022   K 4.0 07/10/2022  CO2 25 07/10/2022   GLUCOSE 106 (H) 07/10/2022   BUN 24 (H) 07/10/2022   CREATININE 0.75 07/10/2022   BILITOT 1.1 12/09/2021   ALKPHOS 103 12/09/2021   AST 19 12/09/2021   ALT 24 12/09/2021   PROT 8.2 (H) 12/09/2021   ALBUMIN 3.9 12/09/2021   CALCIUM 9.4 07/10/2022   ANIONGAP 9 07/10/2022   EGFR 77 06/01/2022   Lab Results  Component Value Date   CHOL 124 12/09/2021   Lab Results  Component Value Date   HDL 46 12/09/2021   Lab Results  Component Value Date   LDLCALC 56 12/09/2021   Lab Results  Component Value Date   TRIG 108 12/09/2021   Lab Results  Component Value Date   CHOLHDL 2.7 12/09/2021   Lab Results  Component Value Date   HGBA1C 5.8 (H) 06/01/2022      Assessment & Plan:  Primary hypertension Assessment & Plan: Controlled Denies visual disturbances, chest pain, palpitation Encouraged to increase her fluid consumption, at least 64 ounces of fluids daily Encouraged to continue taking amlodipine olmesartan 10-40mg  daily BP Readings from Last 3 Encounters:  11/27/22 138/86  09/16/22 (!) 162/84  07/13/22 135/88      Other hyperlipidemia Assessment & Plan: She takes Lipitor 40 mg daily Reports compliance with treatment regimen Pending lipid panel Lab Results  Component Value Date   CHOL 124 12/09/2021   HDL 46 12/09/2021   LDLCALC 56 12/09/2021   TRIG 108 12/09/2021   CHOLHDL 2.7 12/09/2021     Orders: -     CBC with Differential/Platelet -     CMP14+EGFR -     Lipid panel  Other specified hypothyroidism Assessment & Plan: She takes Synthroid 50 mcg daily Reports compliance with treatment regimen No complaints or concerns voiced Lab Results  Component Value Date   TSH 3.710 06/01/2022       Gastroesophageal reflux disease without esophagitis Assessment & Plan: She takes Protonix 40 mg twice daily Complaints or concerns reported Encouraged to continue treatment regimen   Vitamin D deficiency -     VITAMIN D 25 Hydroxy (Vit-D Deficiency, Fractures)  Impaired fasting blood sugar -     Hemoglobin A1c  Other specified disorders of thyroid -     TSH + free T4    Follow-up: Return in about 3 months (around 02/27/2023).   Gilmore Laroche, FNP

## 2022-11-27 NOTE — Assessment & Plan Note (Addendum)
She takes Protonix 40 mg twice daily Complaints or concerns reported Encouraged to continue treatment regimen

## 2022-11-27 NOTE — Assessment & Plan Note (Signed)
She takes Lipitor 40 mg daily Reports compliance with treatment regimen Pending lipid panel Lab Results  Component Value Date   CHOL 124 12/09/2021   HDL 46 12/09/2021   LDLCALC 56 12/09/2021   TRIG 108 12/09/2021   CHOLHDL 2.7 12/09/2021

## 2023-01-25 ENCOUNTER — Other Ambulatory Visit: Payer: Self-pay

## 2023-01-25 DIAGNOSIS — E785 Hyperlipidemia, unspecified: Secondary | ICD-10-CM

## 2023-01-25 MED ORDER — ATORVASTATIN CALCIUM 40 MG PO TABS: 40.0000 mg | ORAL_TABLET | Freq: Every day | ORAL | 5 refills | Status: DC

## 2023-02-03 ENCOUNTER — Other Ambulatory Visit: Payer: Self-pay | Admitting: Hematology and Oncology

## 2023-02-03 DIAGNOSIS — E039 Hypothyroidism, unspecified: Secondary | ICD-10-CM

## 2023-02-26 IMAGING — MG DIGITAL DIAGNOSTIC BILAT W/ TOMO W/ CAD
8 series · 8 of 24 positions shown · non-contrast
Comparison: Previous exam(s).

CLINICAL DATA: One year follow-up for probably benign masses in the
RIGHT breast.

EXAM:
DIGITAL DIAGNOSTIC BILATERAL MAMMOGRAM WITH TOMOSYNTHESIS AND CAD;
ULTRASOUND RIGHT BREAST LIMITED
TECHNIQUE: Bilateral digital diagnostic mammography and breast tomosynthesis
was performed. The images were evaluated with computer-aided
detection.; Targeted ultrasound examination of the right breast was
performed

[L CC synth-2D]
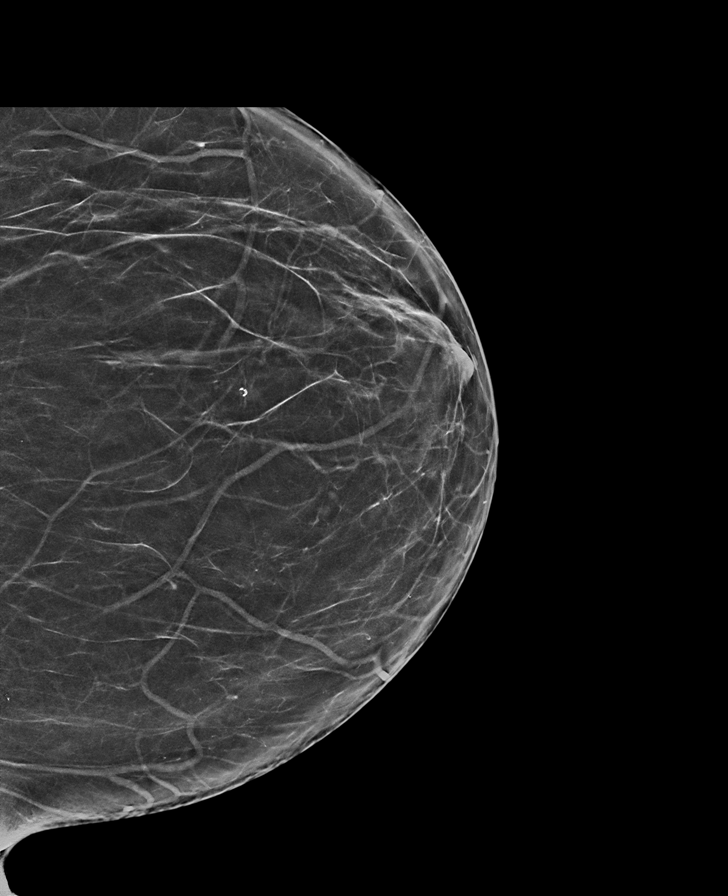

[L MLO synth-2D]
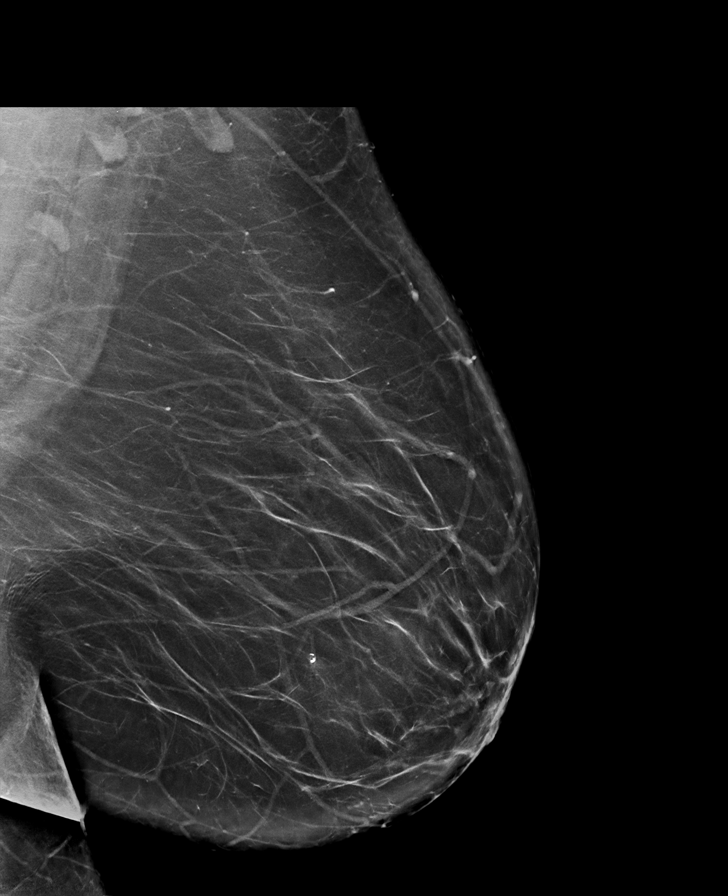

[R CC synth-2D]
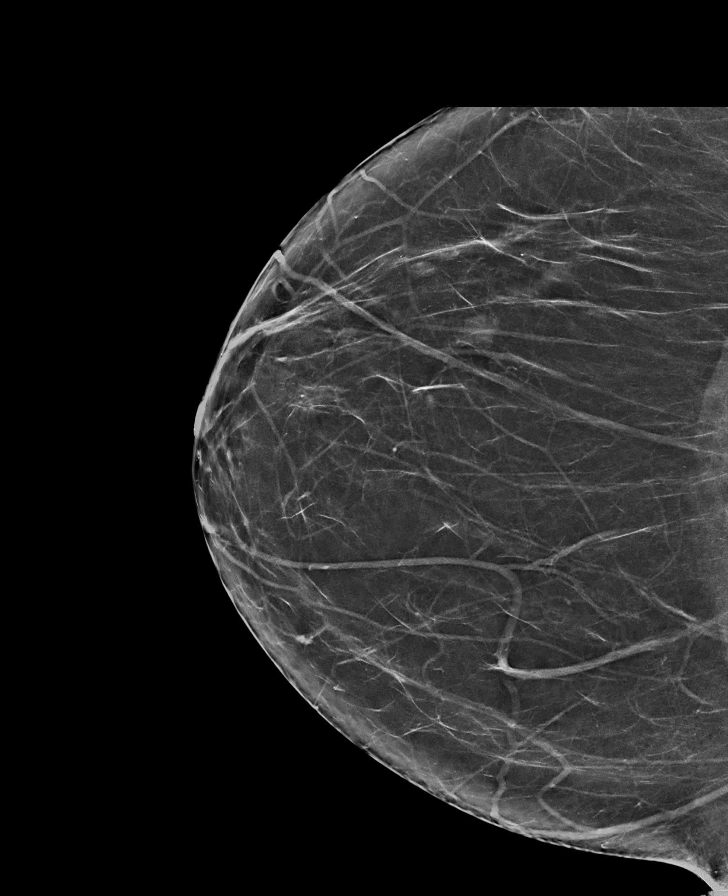

[R MLO synth-2D]
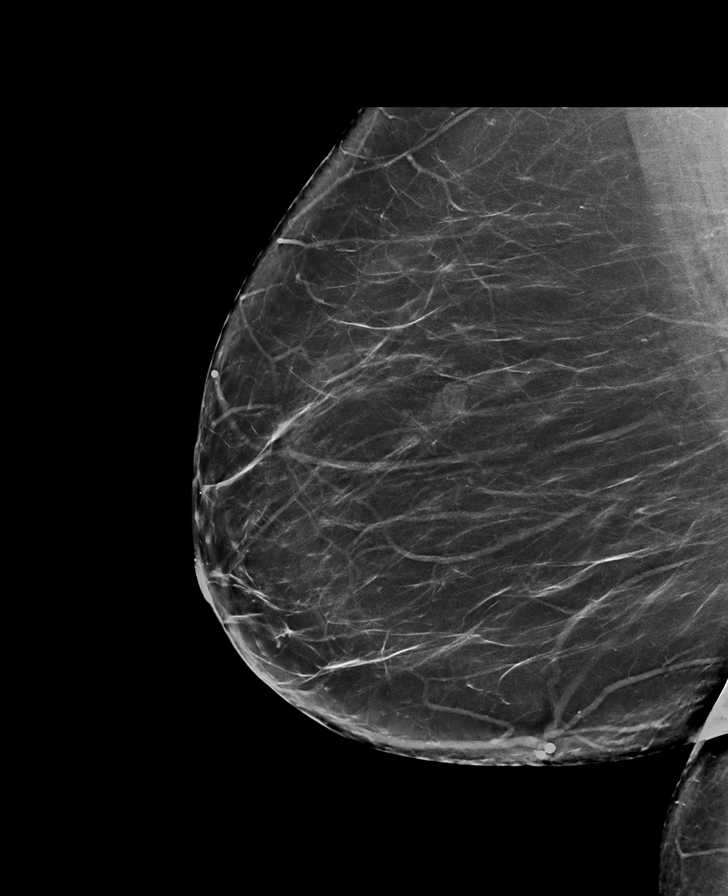

[R MLO tomo · tomo slice 42/83.0]
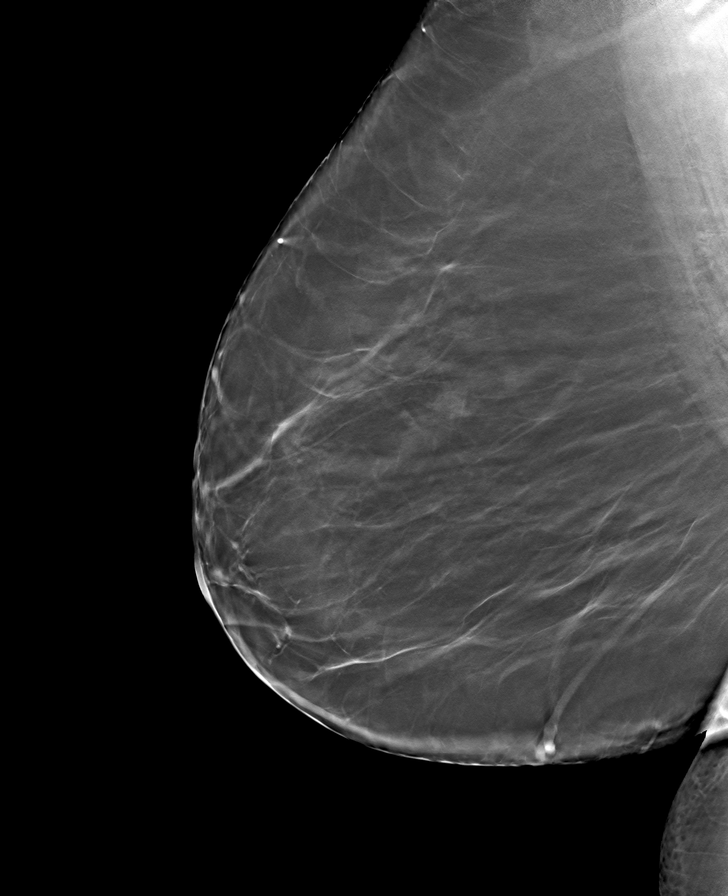

[L MLO tomo · tomo slice 46/91.0]
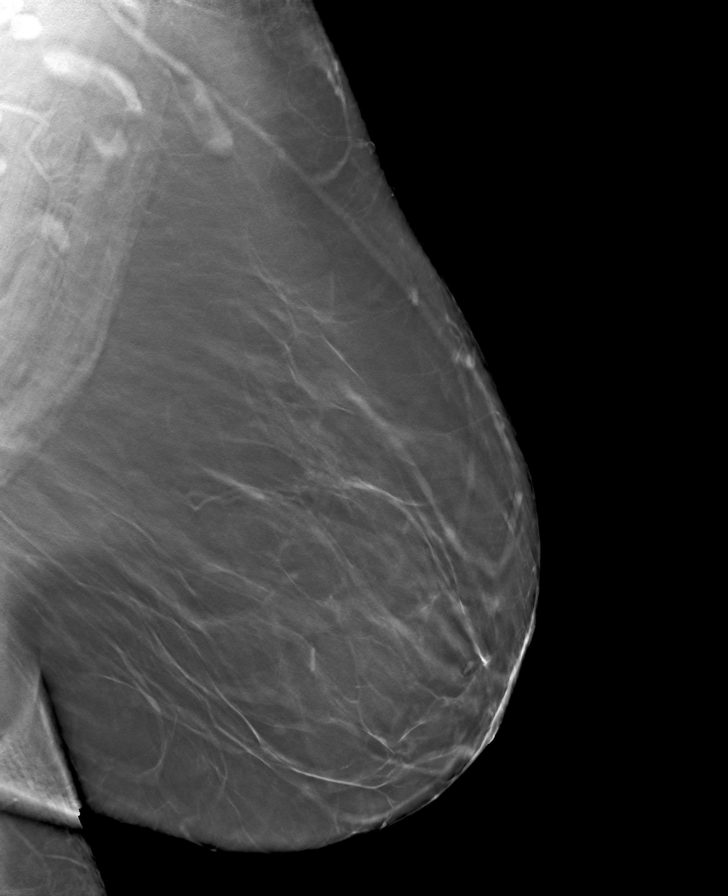

[R CC tomo · tomo slice 37/72.0]
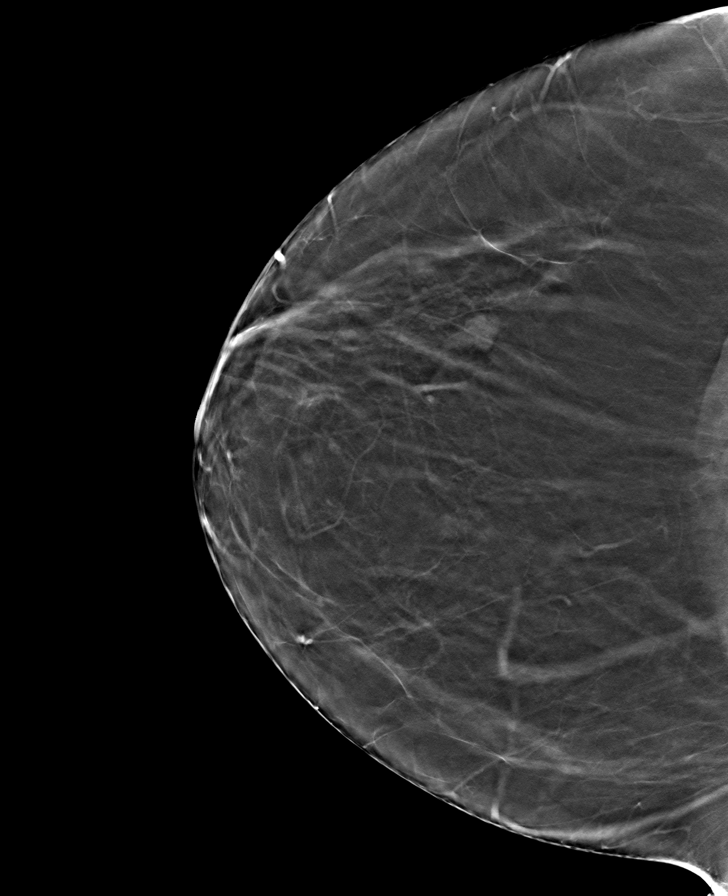

[L CC tomo · tomo slice 36/71.0]
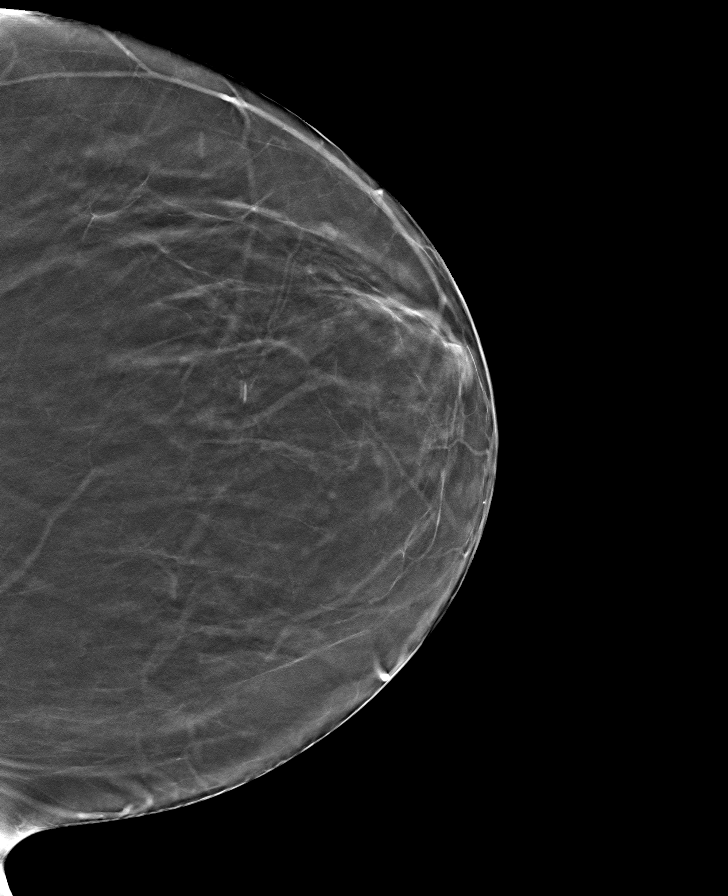

[8 of 24 positions shown; findings below may reference images not displayed]

ACR Breast Density Category b: There are scattered areas of
fibroglandular density.
FINDINGS: LEFT breast is negative.

Circumscribed oval masses are again noted in the LATERAL portion of
the RIGHT breast and further evaluated with ultrasound.

Targeted ultrasound is performed, showing a circumscribed oval mass
in the 9 o'clock location of the RIGHT breast 4 centimeters from the
nipple which measures 0.7 x 0.3 x 0.6 centimeters.

In the 10 o'clock location 4 centimeters from the nipple, an oval
mass is 0.9 x 0.6 x 1.1 centimeters. No new masses or acoustic
shadowing identified.
IMPRESSION: 1. Stable appearance of benign-appearing masses in the RIGHT breast.
2.  No mammographic or ultrasound evidence for malignancy.

RECOMMENDATION:
Recommend bilateral diagnostic mammogram and RIGHT breast ultrasound
in 1 year to complete follow-up.

I have discussed the findings and recommendations with the patient.
If applicable, a reminder letter will be sent to the patient
regarding the next appointment.

BI-RADS CATEGORY  3: Probably benign.

## 2023-02-26 IMAGING — US US BREAST*R* LIMITED INC AXILLA
1 series · 13 of 19 positions shown · non-contrast
Comparison: Previous exam(s).

CLINICAL DATA: One year follow-up for probably benign masses in the
RIGHT breast.

EXAM:
DIGITAL DIAGNOSTIC BILATERAL MAMMOGRAM WITH TOMOSYNTHESIS AND CAD;
ULTRASOUND RIGHT BREAST LIMITED
TECHNIQUE: Bilateral digital diagnostic mammography and breast tomosynthesis
was performed. The images were evaluated with computer-aided
detection.; Targeted ultrasound examination of the right breast was
performed

[Series 1: us breast*right* limited inc axilla · 0.07mm/px · 13 of 19 slices shown]
[im 1/19]
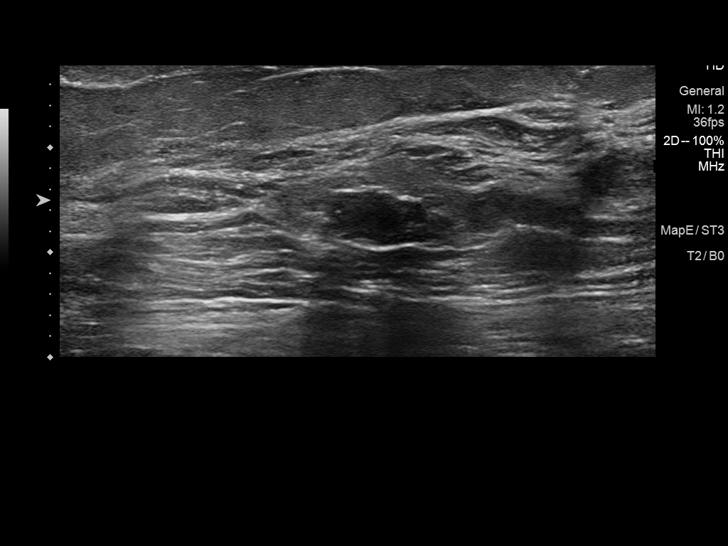
[im 3/19]
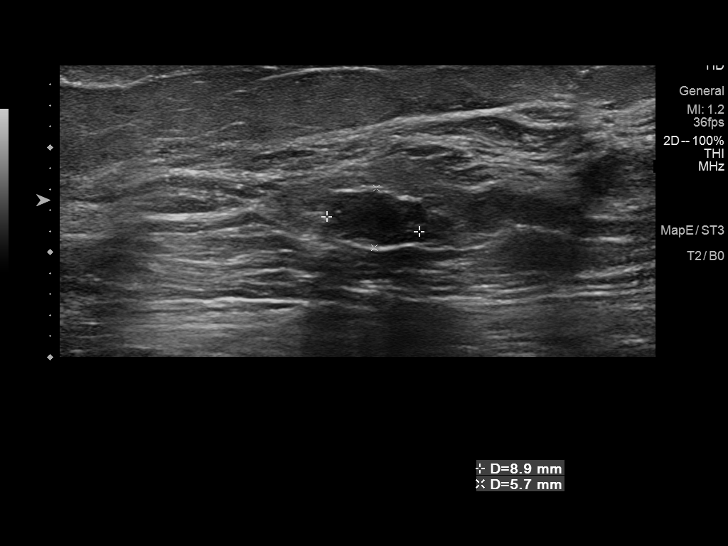
[im 4/19]
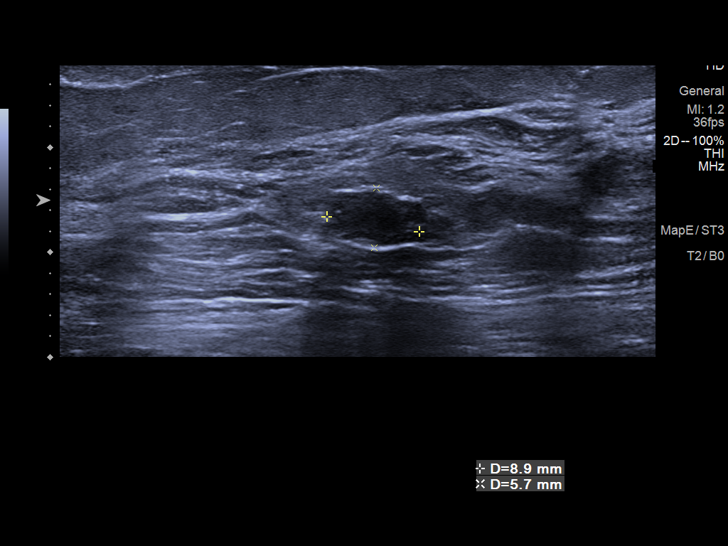
[im 6/19]
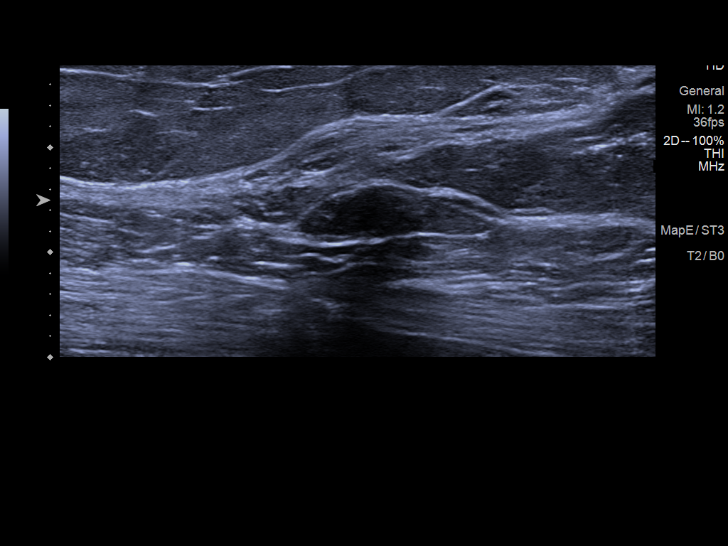
[im 7/19]
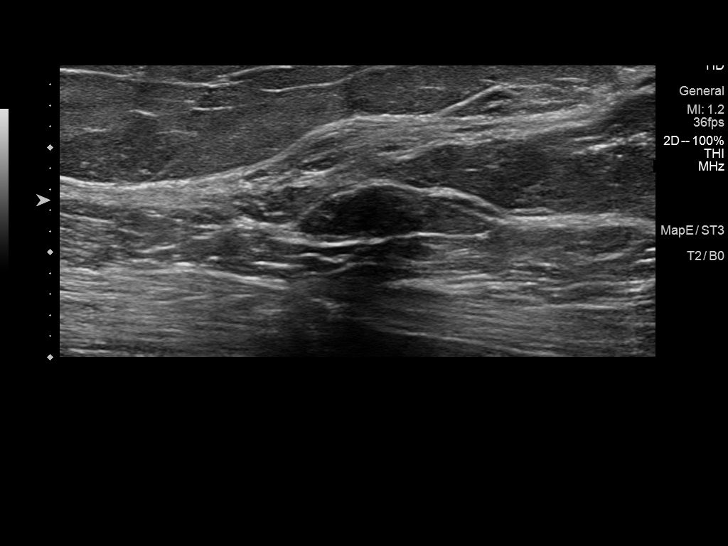
[im 9/19]
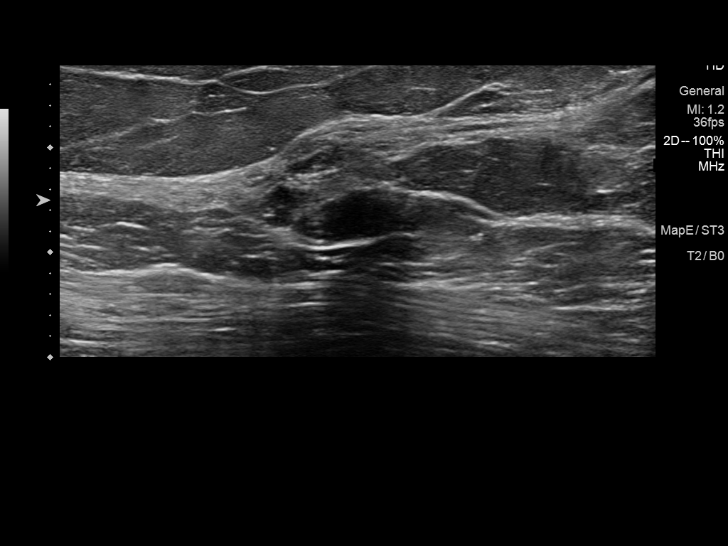
[im 10/19]
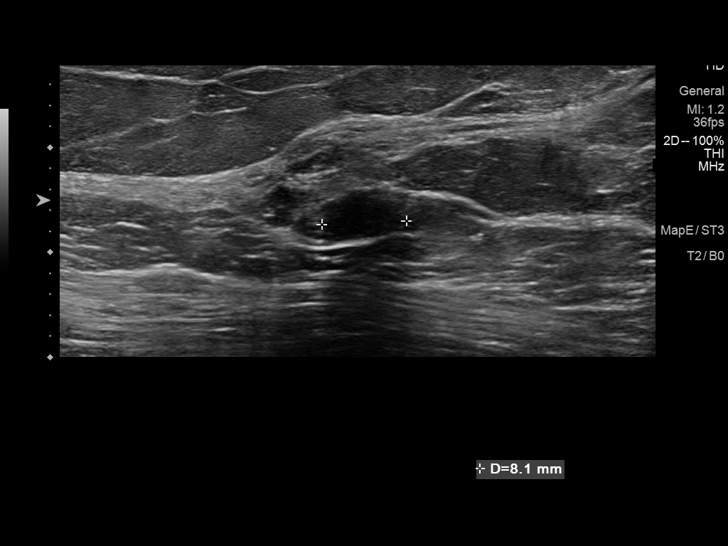
[im 11/19]
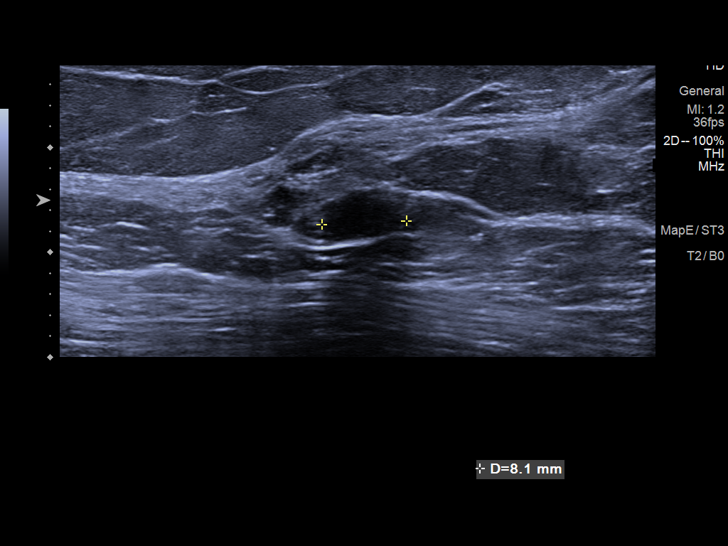
[im 13/19]
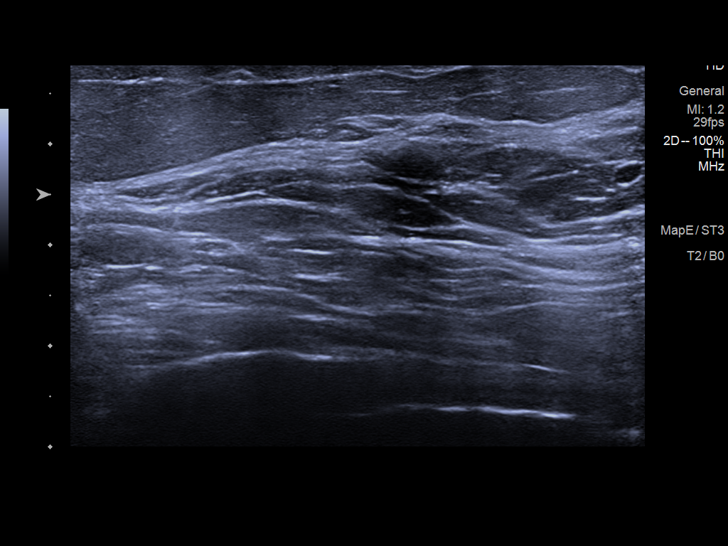
[im 14/19]
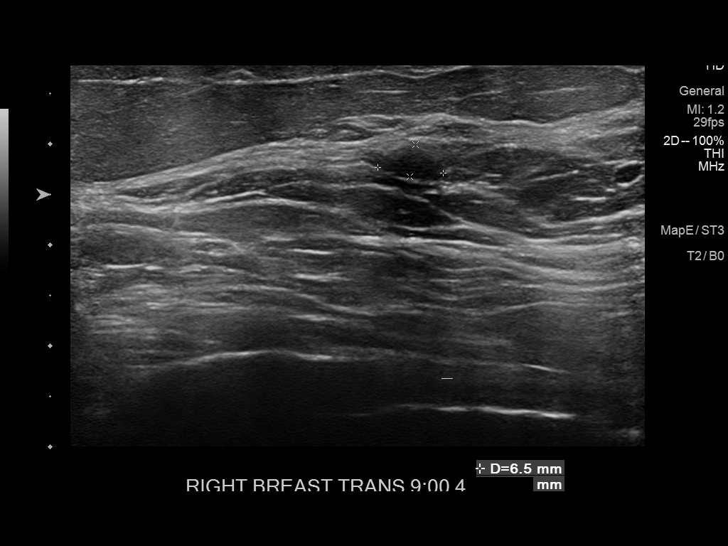
[im 16/19]
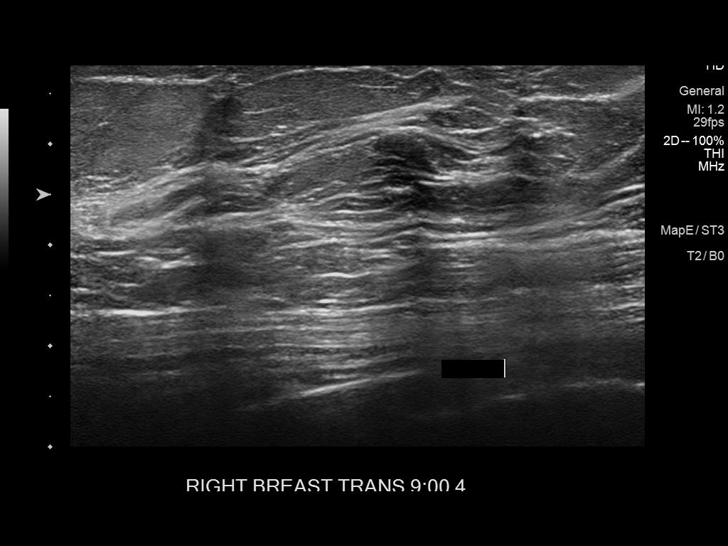
[im 17/19]
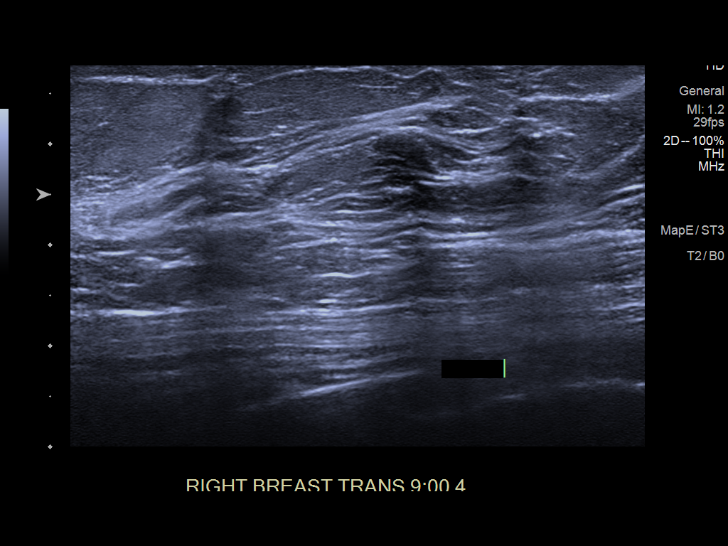
[im 19/19]
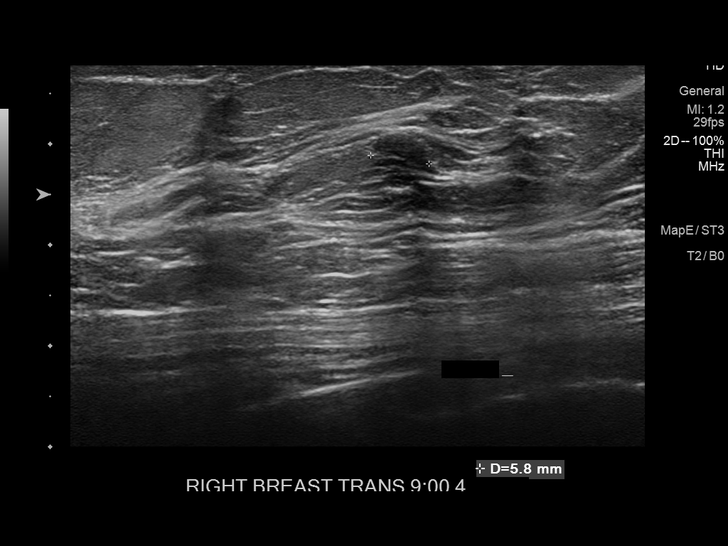

[13 of 19 positions shown; findings below may reference images not displayed]

ACR Breast Density Category b: There are scattered areas of
fibroglandular density.
FINDINGS: LEFT breast is negative.

Circumscribed oval masses are again noted in the LATERAL portion of
the RIGHT breast and further evaluated with ultrasound.

Targeted ultrasound is performed, showing a circumscribed oval mass
in the 9 o'clock location of the RIGHT breast 4 centimeters from the
nipple which measures 0.7 x 0.3 x 0.6 centimeters.

In the 10 o'clock location 4 centimeters from the nipple, an oval
mass is 0.9 x 0.6 x 1.1 centimeters. No new masses or acoustic
shadowing identified.
IMPRESSION: 1. Stable appearance of benign-appearing masses in the RIGHT breast.
2.  No mammographic or ultrasound evidence for malignancy.

RECOMMENDATION:
Recommend bilateral diagnostic mammogram and RIGHT breast ultrasound
in 1 year to complete follow-up.

I have discussed the findings and recommendations with the patient.
If applicable, a reminder letter will be sent to the patient
regarding the next appointment.

BI-RADS CATEGORY  3: Probably benign.

## 2023-03-02 ENCOUNTER — Ambulatory Visit: Payer: 59 | Admitting: Family Medicine

## 2023-03-19 ENCOUNTER — Ambulatory Visit: Payer: 59 | Admitting: Family Medicine

## 2023-03-23 NOTE — Telephone Encounter (Signed)
I do not manage this patient's levothyroxine.

## 2023-05-04 ENCOUNTER — Ambulatory Visit: Payer: 59 | Admitting: Family Medicine

## 2023-05-05 ENCOUNTER — Other Ambulatory Visit (HOSPITAL_COMMUNITY): Payer: Self-pay | Admitting: Family Medicine

## 2023-05-05 DIAGNOSIS — Z1231 Encounter for screening mammogram for malignant neoplasm of breast: Secondary | ICD-10-CM

## 2023-05-12 ENCOUNTER — Encounter: Payer: Self-pay | Admitting: Family Medicine

## 2023-05-24 ENCOUNTER — Encounter: Payer: Self-pay | Admitting: Obstetrics & Gynecology

## 2023-05-24 ENCOUNTER — Other Ambulatory Visit (HOSPITAL_COMMUNITY)
Admission: RE | Admit: 2023-05-24 | Discharge: 2023-05-24 | Disposition: A | Discharge status: Home / Self Care | Payer: 59 | Source: Ambulatory Visit | Attending: Obstetrics & Gynecology | Admitting: Obstetrics & Gynecology

## 2023-05-24 ENCOUNTER — Ambulatory Visit (INDEPENDENT_AMBULATORY_CARE_PROVIDER_SITE_OTHER): Payer: 59 | Admitting: Obstetrics & Gynecology

## 2023-05-24 VITALS — BP 139/82 | HR 85 | Ht 66.0 in | Wt 272.8 lb

## 2023-05-24 DIAGNOSIS — Z9889 Other specified postprocedural states: Secondary | ICD-10-CM | POA: Insufficient documentation

## 2023-05-24 DIAGNOSIS — Z01419 Encounter for gynecological examination (general) (routine) without abnormal findings: Secondary | ICD-10-CM

## 2023-05-24 NOTE — Progress Notes (Signed)
WELL-WOMAN EXAMINATION Patient name: Barbara Snow MRN [id number]  Date of birth: 14-Aug-1965 Chief Complaint:   Gynecologic Exam  History of Present Illness:   Barbara Snow is a 57 y.o. G3P0 PM, s/p supracervical hysterectomy female being seen today for a routine well-woman exam.   She reports no acute GYN concerns today.  She denies vaginal discharge, itching or irritation.  Denies pelvic or abdominal pain.  She is not having any vaginal spotting.  Reports no urinary concerns   No LMP recorded. Patient has had a hysterectomy.  The current method of family planning is none.    Last pap 04/2022- ASCUS, HPV neg, h/o LEEP 2022.  Last mammogram: scheduled for December.      05/24/2023    9:32 AM 11/27/2022   10:57 AM 07/13/2022    1:43 PM 06/01/2022    9:23 AM 05/18/2022    2:05 PM  Depression screen PHQ 2/9  Decreased Interest 3 0 1 1 0  Down, Depressed, Hopeless 0 0 0 0 0  PHQ - 2 Score 3 0 1 1 0  Altered sleeping 3 1 3 1  0  Tired, decreased energy 1 0 2 1 0  Change in appetite 0 0 0 3 0  Feeling bad or failure about yourself  0 0 0 0 0  Trouble concentrating 0 0 1 1 0  Moving slowly or fidgety/restless 0 0 1 1 0  Suicidal thoughts 0 0 0 0 0  PHQ-9 Score 7 1 8 8  0  Difficult doing work/chores  Not difficult at all Somewhat difficult Somewhat difficult     Review of Systems:   Pertinent items are noted in HPI Denies any headaches, blurred vision, fatigue, shortness of breath, chest pain, abdominal pain, bowel movements, urination, or intercourse unless otherwise stated above.  Pertinent History Reviewed:  Reviewed past medical,surgical, social and family history.  Reviewed problem list, medications and allergies. Physical Assessment:   Vitals:   05/24/23 0932  BP: 139/82  Pulse: 85  Weight: 272 lb 12.8 oz (123.7 kg)  Height: 5\' 6"  (1.676 m)  Body mass index is 44.03 kg/m.        Physical Examination:   General appearance - well appearing, and in no  distress  Mental status - alert, oriented to person, place, and time  Psych:  She has a normal mood and affect  Skin - warm and dry, normal color, no suspicious lesions noted  Chest - effort normal, all lung fields clear to auscultation bilaterally  Heart - normal rate and regular rhythm  Neck:  midline trachea, no thyromegaly or nodules  Breasts - breasts appear normal, no suspicious masses, no skin or nipple changes or  axillary nodes  Abdomen - soft, nontender, nondistended, no masses or organomegaly  Pelvic - VULVA: normal appearing vulva with no masses, tenderness or lesions  VAGINA: normal appearing vagina with normal color and discharge, no lesions  CERVIX: normal appearing cervix without discharge or lesions, no CMT  Thin prep pap is done with HR HPV cotesting  UTERUS: surgically absent  ADNEXA: No adnexal masses or tenderness noted.  Extremities:  No swelling or varicosities noted  Chaperone:  Dr. Ardyth Harps      Assessment & Plan:  1) Well-Woman Exam -pap collected, due to prior h/o dysplasia -mammogram scheduled   Meds: No orders of the defined types were placed in this encounter.   Follow-up: Return in about 1 year (around 05/23/2024) for Annual.   Myna Hidalgo, DO  Attending Obstetrician & Gynecologist, [facility] for [employer], [facility]

## 2023-05-28 ENCOUNTER — Other Ambulatory Visit: Payer: Self-pay | Admitting: Obstetrics & Gynecology

## 2023-05-28 DIAGNOSIS — B9689 Other specified bacterial agents as the cause of diseases classified elsewhere: Secondary | ICD-10-CM

## 2023-05-28 LAB — CYTOLOGY - PAP
Adequacy: ABSENT
Comment: NEGATIVE
Diagnosis: NEGATIVE
High risk HPV: NEGATIVE

## 2023-05-28 MED ORDER — METRONIDAZOLE 500 MG PO TABS: 500.0000 mg | ORAL_TABLET | Freq: Two times a day (BID) | ORAL | 0 refills | Status: AC

## 2023-05-28 NOTE — Progress Notes (Signed)
Rx for BV sent in  Myna Hidalgo, DO Attending Obstetrician & Gynecologist, [facility] for [facility], [facility]

## 2023-05-31 ENCOUNTER — Ambulatory Visit (HOSPITAL_COMMUNITY): Payer: 59

## 2023-06-01 ENCOUNTER — Ambulatory Visit: Payer: 59 | Admitting: Family Medicine

## 2023-06-02 ENCOUNTER — Ambulatory Visit (HOSPITAL_COMMUNITY): Payer: 59

## 2023-06-16 ENCOUNTER — Encounter (HOSPITAL_COMMUNITY): Payer: Self-pay

## 2023-06-16 ENCOUNTER — Ambulatory Visit (HOSPITAL_COMMUNITY): Payer: 59

## 2023-07-05 ENCOUNTER — Ambulatory Visit: Payer: 59 | Admitting: Internal Medicine

## 2023-07-05 ENCOUNTER — Ambulatory Visit: Payer: Self-pay | Admitting: Family Medicine

## 2023-07-05 ENCOUNTER — Other Ambulatory Visit: Payer: Self-pay | Admitting: Family Medicine

## 2023-07-05 ENCOUNTER — Other Ambulatory Visit: Payer: Self-pay

## 2023-07-05 DIAGNOSIS — I10 Essential (primary) hypertension: Secondary | ICD-10-CM

## 2023-07-05 MED ORDER — AMLODIPINE-OLMESARTAN 10-40 MG PO TABS: 1.0000 | ORAL_TABLET | Freq: Every day | ORAL | 3 refills | Status: DC

## 2023-07-05 NOTE — Telephone Encounter (Signed)
 Copied from CRM [id number]. Topic: Clinical - Red Word Triage >> Jul 05, 2023  8:44 AM Raven B wrote: Red Word that prompted transfer to Nurse Triage: Patient advised has been out of amLODipine -olmesartan  (AZOR ) 10-40 MG tablet for a while and has been experiencing a headache from the front of her head to the back.  Chief Complaint:  Out of Blood Pressure Medicine and now having  headaches that start at a the back and comes to her front cause a Moderate to sever headache at times. Currently moderate. Patient reports  stress at work and home. Suggested BH   or EAP if she has it. Patient was offered an appointment for today but decline and said she would keep her appointment for the 2/11 which  this RN Agent see on the schedule. Symptoms:  Pain in the back of head that moves  to the front Frequency: Last week since she has been out of her medication for blood pressure. Pertinent Negatives: Patiens  Numbness or tingling. Disposition: [] ED /[] Urgent Care (no appt availability in office) / [x] Appointment(In office/virtual)/ []  [place] [facility]/ [] Home Care/ [] Refused Recommended Disposition /[] [facility]/ []  Follow-up with PCP Additional Notes:  RN Agent suggested patient to  request 3 days supply from pharmacist an also  have her behavioral health  for coping skills. Reason for Disposition  [1] SEVERE headache (e.g., excruciating) AND [2] not improved after 2 hours of pain medicine  Answer Assessment - Initial Assessment Questions 1. LOCATION: Where does it hurt?       Headache-front head 2. ONSET: When did the headache start? (Minutes, hours or days)       One Week  3. PATTERN: Does the pain come and go, or has it been constant since it started? Constant  pain- starts at the back and the moves to front      4. SEVERITY: How bad is the pain? and What does it keep you from doing?  (e.g., Scale 1-10; mild, moderate, or severe)   Moderate to severe at times      - MODERATE  (4-7): interferes with normal activities or awakens from sleep    - SEVERE (8-10): excruciating pain, unable to do any normal activities         5. RECURRENT SYMPTOM: Have you ever had headaches before? If Yes, ask: When was the last time? and What happened that time?       Yes but it  6. CAUSE: What do you think is causing the headache?      Works with [employer] with [employer] 7. MIGRAINE: Have you been diagnosed with migraine  and stressor headaches? If Yes, ask: Is this headache similar?         Patient had a stroke before 4--5 years-  No. 8. HEAD INJURY: Has there been any recent injury to the head?       Denies 9. OTHER SYMPTOMS: Do you have any other symptoms? (fever, stiff neck, eye pain, sore throat, cold symptoms)      Denies 10. PREGNANCY: Is there any chance you are pregnant? When was your last menstrual period?        Denies  Protocols used: Headache-A-AH

## 2023-07-05 NOTE — Telephone Encounter (Signed)
 Copied from CRM [id number]. Topic: Clinical - Medication Refill >> Jul 05, 2023  8:40 AM Raven B wrote: Most Recent Primary Care Visit:  Provider: MAREN DAS  Department: [facility]  Visit Type: OFFICE VISIT  Date: 11/27/2022  Medication: amLODipine -olmesartan  (AZOR ) 10-40 MG tablet   Has the patient contacted their pharmacy? Yes (Agent: If no, request that the patient contact the pharmacy for the refill. If patient does not wish to contact the pharmacy document the reason why and proceed with request.) (Agent: If yes, when and what did the pharmacy advise?) Pharmacy unable to reach provider   Is this the correct pharmacy for this prescription? Yes If no, delete pharmacy and type the correct one.  This is the patient's preferred pharmacy:  [facility] [address], [place] - [address] [place] [address] [place] [address] Phone: [phone] Fax: [phone]    Has the prescription been filled recently? No  Is the patient out of the medication? Yes  Has the patient been seen for an appointment in the last year OR does the patient have an upcoming appointment? Yes  Can we respond through MyChart? Yes  Agent: Please be advised that Rx refills may take up to 3 business days. We ask that you follow-up with your pharmacy.

## 2023-07-23 ENCOUNTER — Other Ambulatory Visit: Payer: Self-pay | Admitting: Family Medicine

## 2023-07-23 DIAGNOSIS — G629 Polyneuropathy, unspecified: Secondary | ICD-10-CM

## 2023-07-26 ENCOUNTER — Other Ambulatory Visit: Payer: Self-pay | Admitting: Family Medicine

## 2023-07-26 DIAGNOSIS — G629 Polyneuropathy, unspecified: Secondary | ICD-10-CM

## 2023-07-26 NOTE — Telephone Encounter (Signed)
Copied from CRM [id number]. Topic: Clinical - Medication Refill >> Jul 26, 2023  8:59 AM Geroge Baseman wrote: Most Recent Primary Care Visit:  Provider: Gilmore Laroche  Department: [facility]  Visit Type: OFFICE VISIT  Date: 11/27/2022  Medication: gabapentin (NEURONTIN) 100 MG capsule  Has the patient contacted their pharmacy? Yes (Agent: If no, request that the patient contact the pharmacy for the refill. If patient does not wish to contact the pharmacy document the reason why and proceed with request.) (Agent: If yes, when and what did the pharmacy advise?)  Is this the correct pharmacy for this prescription? Yes If no, delete pharmacy and type the correct one.  This is the patient's preferred pharmacy:   [facility] [address] [place] - [address] [place] [address] [place] [address] Phone: [phone] Fax: [phone]     Has the prescription been filled recently? No  Is the patient out of the medication? Yes  Has the patient been seen for an appointment in the last year OR does the patient have an upcoming appointment? Yes  Can we respond through MyChart? Yes  Agent: Please be advised that Rx refills may take up to 3 business days. We ask that you follow-up with your pharmacy.

## 2023-08-10 ENCOUNTER — Ambulatory Visit: Payer: 59 | Admitting: Family Medicine

## 2023-08-18 ENCOUNTER — Other Ambulatory Visit: Payer: Self-pay | Admitting: Family Medicine

## 2023-08-18 DIAGNOSIS — G629 Polyneuropathy, unspecified: Secondary | ICD-10-CM

## 2023-08-22 ENCOUNTER — Other Ambulatory Visit: Payer: Self-pay

## 2023-08-22 ENCOUNTER — Emergency Department (HOSPITAL_COMMUNITY)
Admission: EM | Admit: 2023-08-22 | Discharge: 2023-08-22 | Disposition: A | Discharge status: Home / Self Care | Payer: 59 | Attending: Emergency Medicine | Admitting: Emergency Medicine

## 2023-08-22 ENCOUNTER — Encounter (HOSPITAL_COMMUNITY): Payer: Self-pay

## 2023-08-22 DIAGNOSIS — R202 Paresthesia of skin: Secondary | ICD-10-CM | POA: Diagnosis not present

## 2023-08-22 DIAGNOSIS — Z79899 Other long term (current) drug therapy: Secondary | ICD-10-CM | POA: Diagnosis not present

## 2023-08-22 DIAGNOSIS — E876 Hypokalemia: Secondary | ICD-10-CM | POA: Insufficient documentation

## 2023-08-22 DIAGNOSIS — Z7982 Long term (current) use of aspirin: Secondary | ICD-10-CM | POA: Diagnosis not present

## 2023-08-22 DIAGNOSIS — R42 Dizziness and giddiness: Secondary | ICD-10-CM | POA: Insufficient documentation

## 2023-08-22 DIAGNOSIS — Z5329 Procedure and treatment not carried out because of patient's decision for other reasons: Secondary | ICD-10-CM | POA: Insufficient documentation

## 2023-08-22 LAB — COMPREHENSIVE METABOLIC PANEL
ALT: 24 U/L (ref 0–44)
AST: 20 U/L (ref 15–41)
Albumin: 4.3 g/dL (ref 3.5–5.0)
Alkaline Phosphatase: 102 U/L (ref 38–126)
Anion gap: 9 (ref 5–15)
BUN: 14 mg/dL (ref 6–20)
CO2: 25 mmol/L (ref 22–32)
Calcium: 9.8 mg/dL (ref 8.9–10.3)
Chloride: 104 mmol/L (ref 98–111)
Creatinine, Ser: 0.7 mg/dL (ref 0.44–1.00)
GFR, Estimated: >60 mL/min (ref >60)
Glucose, Bld: 106 mg/dL — ABNORMAL HIGH (ref 70–99)
Potassium: 3.2 mmol/L — ABNORMAL LOW (ref 3.5–5.1)
Sodium: 138 mmol/L (ref 135–145)
Total Bilirubin: 1.1 mg/dL (ref 0.0–1.2)
Total Protein: 8.8 g/dL — ABNORMAL HIGH (ref 6.5–8.1)

## 2023-08-22 LAB — CBC
HCT: 39.9 % (ref 36.0–46.0)
Hemoglobin: 13.7 g/dL (ref 12.0–15.0)
MCH: 29.8 pg (ref 26.0–34.0)
MCHC: 34.3 g/dL (ref 30.0–36.0)
MCV: 86.9 fL (ref 80.0–100.0)
Platelets: 247 10*3/uL (ref 150–400)
RBC: 4.59 MIL/uL (ref 3.87–5.11)
RDW: 13.2 % (ref 11.5–15.5)
WBC: 6 10*3/uL (ref 4.0–10.5)
nRBC: 0 % (ref 0.0–0.2)

## 2023-08-22 MED ORDER — PROCHLORPERAZINE EDISYLATE 10 MG/2ML IJ SOLN
5.0000 mg | Freq: Once | INTRAMUSCULAR | Status: AC
  Administered 2023-08-22: 5 mg via INTRAVENOUS
  Filled 2023-08-22: qty 2

## 2023-08-22 MED ORDER — LORAZEPAM 2 MG/ML IJ SOLN
1.0000 mg | Freq: Once | INTRAMUSCULAR | Status: DC | PRN
  Filled 2023-08-22: qty 1

## 2023-08-22 NOTE — ED Notes (Signed)
 Pt requested to leave. EDP aware and talking to pt.

## 2023-08-22 NOTE — Discharge Instructions (Signed)
 You are leaving against advice.  Stroke has not been ruled out.  Feel free to return at any time and please follow-up with your doctor.

## 2023-08-22 NOTE — ED Triage Notes (Addendum)
 Pt c/o dizziness and slight headache since yesterday morning 0555. Pt also states that she is having blurried vision as well. NIH 0 during triage. Pt states takes HTN medication but history of mini stroke so her PCP office made her come to the ED. Pt LKW Friday 08/19/2023 at 2200.

## 2023-08-22 NOTE — ED Provider Notes (Signed)
 [place] EMERGENCY DEPARTMENT AT [facility] Provider Note   CSN: [id number] Arrival date & time: 08/22/23  4098     History  Chief Complaint  Patient presents with   Dizziness    Barbara Snow is a 58 y.o. female.   Dizziness Patient presents with headache and dizziness.  States last normal was 2 days ago.  Been feeling as if she is tingling on the right side.  Similar symptoms to her previous stroke.  Does have some baseline tingling on the side also.  Also has a dull headache and states vision has been little blurred.  Told by his PCP to come to the ER.       Home Medications Prior to Admission medications   Medication Sig Start Date End Date Taking? Authorizing Provider  albuterol (VENTOLIN HFA) 108 (90 Base) MCG/ACT inhaler Inhale 1-2 puffs into the lungs every 6 (six) hours as needed for wheezing or shortness of breath. 09/16/22   Linwood Dibbles, MD  amLODipine-olmesartan (AZOR) 10-40 MG tablet Take 1 tablet by mouth daily. 07/05/23   Gilmore Laroche, FNP  aspirin EC 81 MG tablet Take 81 mg by mouth daily. Swallow whole.    [provider]  atorvastatin (LIPITOR) 40 MG tablet Take 1 tablet (40 mg total) by mouth daily. 01/25/23   Gardenia Phlegm, MD  gabapentin (NEURONTIN) 100 MG capsule Take 1 capsule by mouth at bedtime 08/19/23   Gilmore Laroche, FNP  levothyroxine (SYNTHROID) 50 MCG tablet Take 1 tablet (50 mcg total) by mouth daily. 06/01/22   Gardenia Phlegm, MD  metroNIDAZOLE (FLAGYL) 500 MG tablet Take 1 tablet (500 mg total) by mouth 2 (two) times daily. 05/28/23   Myna Hidalgo, DO  pantoprazole (PROTONIX) 40 MG tablet Take 1 tablet (40 mg total) by mouth 2 (two) times daily. 06/01/22   Gardenia Phlegm, MD      Allergies    Patient has no known allergies.    Review of Systems   Review of Systems  Neurological:  Positive for dizziness.    Physical Exam Updated Vital Signs BP (!) 160/81 (BP Location: Left Wrist)   Pulse 60   Temp 98 F (36.7 C)  (Oral)   Resp 12   Ht 5\' 6"  (1.676 m)   Wt 115.7 kg   SpO2 97%   BMI 41.16 kg/m  Physical Exam Vitals and nursing note reviewed.  Cardiovascular:     Rate and Rhythm: Regular rhythm.  Chest:     Chest wall: No tenderness.  Abdominal:     Tenderness: There is no abdominal tenderness.  Neurological:     Mental Status: She is alert.     Comments: Eye movement intact.  Visual fields grossly intact.  Finger-nose intact bilaterally.  Paresthesias to right face and right hand.  Strength appears intact.     ED Results / Procedures / Treatments   Labs (all labs ordered are listed, but only abnormal results are displayed) Labs Reviewed  COMPREHENSIVE METABOLIC PANEL  CBC    EKG None  Radiology No results found.  Procedures Procedures    Medications Ordered in ED Medications  LORazepam (ATIVAN) injection 1 mg (has no administration in time range)  prochlorperazine (COMPAZINE) injection 5 mg (5 mg Intravenous Given 08/22/23 1020)    ED Course/ Medical Decision Making/ A&P  Medical Decision Making Amount and/or Complexity of Data Reviewed Labs: ordered. Radiology: ordered.  Risk Prescription drug management.  Patient with headache and dizziness.  Does have some baseline right side tingling that is reportedly gotten worse.  Differential diagnosis does include stroke since has had previously.  Will get MRI to evaluate although patient states she does not do well with it.  Will give some Ativan to help.  Also get basic blood work and what the headache and some vision changes will also treat for migraine.  Patient received medicine.  Mild hypokalemia on lab work.  However patient not willing to stay for MRI.  States she just wants to leave.  Discussed with patient potentially could be some akathisia from the Compazine.  Aware of risks and is wants to go home.  Will discharge AMA.        Final Clinical Impression(s) / ED Diagnoses Final  diagnoses:  None    Rx / DC Orders ED Discharge Orders     None         Benjiman Core, MD 08/22/23 1340

## 2023-08-23 ENCOUNTER — Other Ambulatory Visit: Payer: Self-pay | Admitting: Family Medicine

## 2023-08-23 ENCOUNTER — Ambulatory Visit: Payer: Self-pay | Admitting: Family Medicine

## 2023-08-23 DIAGNOSIS — E876 Hypokalemia: Secondary | ICD-10-CM

## 2023-08-23 MED ORDER — POTASSIUM CHLORIDE CRYS ER 10 MEQ PO TBCR: 10.0000 meq | EXTENDED_RELEASE_TABLET | Freq: Two times a day (BID) | ORAL | 0 refills | Status: AC

## 2023-08-23 NOTE — Telephone Encounter (Signed)
  Chief Complaint: fall went to ED yesterday , labs abnormal requesting if medication needed for low potassium Symptoms: fell getting off of commode and hit head. Went to ED not sx now . Reports labs potassium 3.2 and glucose 106. Frequency: yesterday  Pertinent Negatives: Patient denies chest pain no difficulty breathing no fever no headaches no nausea no blurred vision Disposition: [] ED /[] Urgent Care (no appt availability in office) / [x] Appointment(In office/virtual)/ []  [place] [facility]/ [] Home Care/ [] Refused Recommended Disposition /[] [place]/ []  Follow-up with PCP Additional Notes:   Appt already scheduled for 08/26/23. No available appt today . Please advise if PCP would like to address potassium levels . Patient concerned due to her mother had heart attack when potassium low please advise.        Copied from CRM [id number]. Topic: Clinical - Red Word Triage >> Aug 23, 2023 11:25 AM Payton Doughty wrote: Red Word that prompted transfer to Nurse Triage: pt went to ED yesterday.  She  was dizzy, head heart and she dropped to floor.  She got results this am her K was 3.2 and her sugar was high.  She is concerned for heart attack. Reason for Disposition  [1] Caller has NON-URGENT question AND [2] triager unable to answer question  Answer Assessment - Initial Assessment Questions 1. MECHANISM: "How did the fall happen?"     Got up from commode room started spinning seeing spots and went down  2. DOMESTIC VIOLENCE AND ELDER ABUSE SCREENING: "Did you fall because someone pushed you or tried to hurt you?" If Yes, ask: "Are you safe now?"     na 3. ONSET: "When did the fall happen?" (e.g., minutes, hours, or days ago) Na  4. LOCATION: "What part of the body hit the ground?" (e.g., back, buttocks, head, hips, knees, hands, head, stomach)     Reports hitting head and went to ED 5. INJURY: "Did you hurt (injure) yourself when you fell?" If Yes, ask: "What did you injure? Tell me  more about this?" (e.g., body area; type of injury; pain severity)"     No  6. PAIN: "Is there any pain?" If Yes, ask: "How bad is the pain?" (e.g., Scale 1-10; or mild,  moderate, severe)   - NONE (0): No pain   - MILD (1-3): Doesn't interfere with normal activities    - MODERATE (4-7): Interferes with normal activities or awakens from sleep    - SEVERE (8-10): Excruciating pain, unable to do any normal activities      no 7. SIZE: For cuts, bruises, or swelling, ask: "How large is it?" (e.g., inches or centimeters)      na 8. PREGNANCY: "Is there any chance you are pregnant?" "When was your last menstrual period?"     na 9. OTHER SYMPTOMS: "Do you have any other symptoms?" (e.g., dizziness, fever, weakness; new onset or worsening).      Dizziness, legs feel weak, feels like pulled muscle in chest area 10. CAUSE: "What do you think caused the fall (or falling)?" (e.g., tripped, dizzy spell)       na  Protocols used: Falls and [facility]

## 2023-08-23 NOTE — Telephone Encounter (Signed)
 A prescription for Potassium 10 mEq to take twice daily (BID) has been sent to the pharmacy. Please advise the patient to return for labs in 1 week to reassess her potassium levels and ensure appropriate management.

## 2023-08-24 NOTE — Telephone Encounter (Signed)
 Copied from CRM [id number]. Topic: Clinical - Lab/Test Results >> Aug 23, 2023 11:18 AM Barbara Snow wrote: Reason for CRM: The patient would like to speak with a member of staff about their recently tested potassium levels (3.2)  The patient would like to speak with a member of staff further when possible regarding their potassium. The patient has expressed concern due to a family history of potassium concerns.   Please contact the patient when possible

## 2023-08-24 NOTE — Telephone Encounter (Signed)
 Spoke to patient, advised to take the potassium rx, and to follow up Thursday with [facility]

## 2023-08-26 ENCOUNTER — Ambulatory Visit: Payer: 59 | Admitting: Family Medicine

## 2023-08-26 ENCOUNTER — Encounter: Payer: Self-pay | Admitting: Family Medicine

## 2023-08-26 ENCOUNTER — Telehealth: Payer: Self-pay

## 2023-08-26 VITALS — BP 135/81 | HR 68 | Ht 66.0 in | Wt 282.0 lb

## 2023-08-26 DIAGNOSIS — E876 Hypokalemia: Secondary | ICD-10-CM | POA: Insufficient documentation

## 2023-08-26 DIAGNOSIS — R7301 Impaired fasting glucose: Secondary | ICD-10-CM | POA: Diagnosis not present

## 2023-08-26 DIAGNOSIS — Z1211 Encounter for screening for malignant neoplasm of colon: Secondary | ICD-10-CM

## 2023-08-26 DIAGNOSIS — Z114 Encounter for screening for human immunodeficiency virus [HIV]: Secondary | ICD-10-CM

## 2023-08-26 DIAGNOSIS — Z6841 Body Mass Index (BMI) 40.0 and over, adult: Secondary | ICD-10-CM

## 2023-08-26 DIAGNOSIS — E7849 Other hyperlipidemia: Secondary | ICD-10-CM

## 2023-08-26 DIAGNOSIS — E038 Other specified hypothyroidism: Secondary | ICD-10-CM

## 2023-08-26 DIAGNOSIS — Z1159 Encounter for screening for other viral diseases: Secondary | ICD-10-CM

## 2023-08-26 DIAGNOSIS — E559 Vitamin D deficiency, unspecified: Secondary | ICD-10-CM | POA: Diagnosis not present

## 2023-08-26 NOTE — Telephone Encounter (Signed)
 Copied from CRM [id number]. Topic: General - Other >> Aug 26, 2023  9:56 AM Barbara Snow wrote: Reason for CRM: Pt calling back to speak with the doctor or nurse about a shot and what's covered by her insurance. Please follow up.

## 2023-08-26 NOTE — Patient Instructions (Addendum)
 I appreciate the opportunity to provide care to you today!    Follow up:  4 months  Labs: please stop by the lab today to get your blood drawn (CBC, CMP, TSH, Lipid profile, HgA1c, Vit D)  Please call and verify with your insurance provider to check if Reginal Lutes is covered under your plan. Let us know if you need any assistance with prior authorization or alternative options.   Attached with your AVS, you will find valuable resources for self-education. I highly recommend dedicating some time to thoroughly examine them.   Please continue to a heart-healthy diet and increase your physical activities. Try to exercise for at least five days a week.    It was a pleasure to see you and I look forward to continuing to work together on your health and well-being. Please do not hesitate to call the office if you need care or have questions about your care.  In case of emergency, please visit the Emergency Department for urgent care, or contact our clinic at [phone] to schedule an appointment. We're here to help you!   Have a wonderful day and week. With Gratitude, Gilmore Laroche MSN, FNP-BC'

## 2023-08-26 NOTE — Progress Notes (Signed)
 Acute Office Visit  Subjective:    Patient ID: Barbara Snow, female    DOB: 1965/10/04, 58 y.o.   MRN: [id number]  Chief Complaint  Patient presents with   Obesity    Discuss weight loss options   potassium    Low potassium , patient is concerned     HPI Patient is in today for with concerns of hypokalemia and obesity.  Hypokalemia : The patient was recently seen in the ED on 08/22/2023 for paresthesia, with a potassium level of 3.2.She is currently asymptomatic but is concerned about low potassium levels.She started potassium supplementation (10 mEq BID) on 08/23/2023 and reports adherence to treatment.  Obesity: The patient reports difficulty losing weight despite lifestyle modifications and increased physical activity. She is interested in [place] for weight management.She denies a history of thyroid cancer or thyroid nodules.   Past Medical History:  Diagnosis Date   Complication of anesthesia    COPD (chronic obstructive pulmonary disease) (HCC)    Depression    Hyperlipidemia    Hypertension    Hypothyroidism    Kidney stone    PONV (postoperative nausea and vomiting)    Stroke (HCC) 10/2017    Past Surgical History:  Procedure Laterality Date   ABDOMINAL HYSTERECTOMY     SUPRACERVICAL   CHOLECYSTECTOMY     LEEP N/A 05/06/2021   Procedure: LOOP ELECTROSURGICAL EXCISION PROCEDURE (LEEP);  Surgeon: Myna Hidalgo, DO;  Location: [facility];  Service: Gynecology;  Laterality: N/A;   TUBAL LIGATION      Family History  Problem Relation Age of Onset   Diabetes Mother    Cancer Mother    Heart attack Father    Liver disease Father    Hypertension Daughter    Hypertension Daughter    Diabetes Sister    Hypertension Son     Social History   Socioeconomic History   Marital status: Single    Spouse name: Not on file   Number of children: 3   Years of education: Not on file   Highest education level: Some college, no degree  Occupational History   Not on file   Tobacco Use   Smoking status: Former    Current packs/day: 0.00    Types: Cigarettes    Quit date: 11/06/1996    Years since quitting: 26.8   Smokeless tobacco: Never  Vaping Use   Vaping status: Never Used  Substance and Sexual Activity   Alcohol use: Yes    Comment: occ   Drug use: No   Sexual activity: Not Currently    Birth control/protection: Post-menopausal  Other Topics Concern   Not on file  Social History Narrative   Not on file   Social Drivers of Health   Financial Resource Strain: Low Risk  (07/04/2023)   Overall Financial Resource Strain (CARDIA)    Difficulty of Paying Living Expenses: Not very hard  Food Insecurity: Food Insecurity Present (07/04/2023)   Hunger Vital Sign    Worried About Running Out of Food in the Last Year: Sometimes true    Ran Out of Food in the Last Year: Sometimes true  Transportation Needs: Unmet Transportation Needs (07/04/2023)   PRAPARE - Transportation    Lack of Transportation (Medical): Yes    Lack of Transportation (Non-Medical): Yes  Physical Activity: Inactive (07/04/2023)   Exercise Vital Sign    Days of Exercise per Week: 0 days    Minutes of Exercise per Session: 30 min  Stress: No Stress  Concern Present (07/04/2023)   [employer] of Occupational Health - Occupational Stress Questionnaire    Feeling of Stress : Only a little  Social Connections: Moderately Isolated (07/04/2023)   Social Connection and Isolation Panel [NHANES]    Frequency of Communication with Friends and Family: Three times a week    Frequency of Social Gatherings with Friends and Family: Once a week    Attends Religious Services: 1 to 4 times per year    Active Member of [employer] or Organizations: No    Attends Banker Meetings: Never    Marital Status: Divorced  Catering manager Violence: Not At Risk (05/24/2023)   Humiliation, Afraid, Rape, and Kick questionnaire    Fear of Current or Ex-Partner: No    Emotionally Abused: No    Physically  Abused: No    Sexually Abused: No    Outpatient Medications Prior to Visit  Medication Sig Dispense Refill   albuterol (VENTOLIN HFA) 108 (90 Base) MCG/ACT inhaler Inhale 1-2 puffs into the lungs every 6 (six) hours as needed for wheezing or shortness of breath. 8 g 0   amLODipine-olmesartan (AZOR) 10-40 MG tablet Take 1 tablet by mouth daily. 90 tablet 3   aspirin EC 81 MG tablet Take 81 mg by mouth daily. Swallow whole.     atorvastatin (LIPITOR) 40 MG tablet Take 1 tablet (40 mg total) by mouth daily. 30 tablet 5   gabapentin (NEURONTIN) 100 MG capsule Take 1 capsule by mouth at bedtime 30 capsule 0   levothyroxine (SYNTHROID) 50 MCG tablet Take 1 tablet (50 mcg total) by mouth daily. 30 tablet 5   metroNIDAZOLE (FLAGYL) 500 MG tablet Take 1 tablet (500 mg total) by mouth 2 (two) times daily. 14 tablet 0   pantoprazole (PROTONIX) 40 MG tablet Take 1 tablet (40 mg total) by mouth 2 (two) times daily. 30 tablet 5   potassium chloride (KLOR-CON M) 10 MEQ tablet Take 1 tablet (10 mEq total) by mouth 2 (two) times daily. 20 tablet 0   No facility-administered medications prior to visit.    No Known Allergies  Review of Systems  Constitutional:  Negative for chills and fever.  Eyes:  Negative for visual disturbance.  Respiratory:  Negative for chest tightness and shortness of breath.   Neurological:  Negative for dizziness and headaches.       Objective:    Physical Exam Constitutional:      Appearance: She is obese.  HENT:     Head: Normocephalic.     Mouth/Throat:     Mouth: Mucous membranes are moist.  Cardiovascular:     Rate and Rhythm: Normal rate.     Heart sounds: Normal heart sounds.  Pulmonary:     Effort: Pulmonary effort is normal.     Breath sounds: Normal breath sounds.  Neurological:     Mental Status: She is alert.     BP 135/81 (BP Location: Right Arm, Patient Position: Sitting, Cuff Size: Large)   Pulse 68   Ht 5\' 6"  (1.676 m)   Wt 282 lb (127.9 kg)    SpO2 95%   BMI 45.52 kg/m  Wt Readings from Last 3 Encounters:  08/26/23 282 lb (127.9 kg)  08/22/23 255 lb (115.7 kg)  05/24/23 272 lb 12.8 oz (123.7 kg)       Assessment & Plan:  Hypokalemia Assessment & Plan: Encouraged to continue current treatment regimen pending potassium levels Encouraged to monitor for signs of hypokalemia, including:Muscle weakness or cramping,  Fatigue, Irregular heartbeats or palpitations,Tingling or numbness and Increased thirst or frequent urination    Morbid obesity (HCC) Assessment & Plan: Encouraged to verify insurance coverage for [facility] and schedule a follow-up to initiate treatment if approved. Continue lifestyle modifications, including balanced nutrition and regular physical activity, in conjunction with medical therapy. Wt Readings from Last 3 Encounters:  08/26/23 282 lb (127.9 kg)  08/22/23 255 lb (115.7 kg)  05/24/23 272 lb 12.8 oz (123.7 kg)      IFG (impaired fasting glucose) -     Hemoglobin A1c  Vitamin D deficiency -     VITAMIN D 25 Hydroxy (Vit-D Deficiency, Fractures)  TSH (thyroid-stimulating hormone deficiency) -     TSH + free T4  Other hyperlipidemia -     Lipid panel -     CMP14+EGFR -     CBC with Differential/Platelet  Encounter for screening for HIV -     HIV Antibody (routine testing w rflx)  Colon cancer screening -     Cologuard  Need for hepatitis C screening test -     Hepatitis C antibody  Note: This chart has been completed using Engineer, civil (consulting) software, and while attempts have been made to ensure accuracy, certain words and phrases may not be transcribed as intended.    Gilmore Laroche, FNP

## 2023-08-26 NOTE — Assessment & Plan Note (Signed)
 Encouraged to continue current treatment regimen pending potassium levels Encouraged to monitor for signs of hypokalemia, including:Muscle weakness or cramping, Fatigue, Irregular heartbeats or palpitations,Tingling or numbness and Increased thirst or frequent urination

## 2023-08-26 NOTE — Telephone Encounter (Signed)
 Kindly inform the patient that she is not a candidate for phentermine given her history of TIA. I recommend a heart-healthy diet with increased physical activity at a moderate intensity, aiming for 150 minutes weekly.

## 2023-08-26 NOTE — Assessment & Plan Note (Signed)
 Encouraged to verify insurance coverage for [facility] and schedule a follow-up to initiate treatment if approved. Continue lifestyle modifications, including balanced nutrition and regular physical activity, in conjunction with medical therapy. Wt Readings from Last 3 Encounters:  08/26/23 282 lb (127.9 kg)  08/22/23 255 lb (115.7 kg)  05/24/23 272 lb 12.8 oz (123.7 kg)

## 2023-08-27 LAB — CBC WITH DIFFERENTIAL/PLATELET
Basophils Absolute: 0 10*3/uL (ref 0.0–0.2)
Basos: 1 %
EOS (ABSOLUTE): 0.2 10*3/uL (ref 0.0–0.4)
Eos: 3 %
Hematocrit: 38.8 % (ref 34.0–46.6)
Hemoglobin: 13.2 g/dL (ref 11.1–15.9)
Immature Grans (Abs): 0 10*3/uL (ref 0.0–0.1)
Immature Granulocytes: 0 %
Lymphocytes Absolute: 2.3 10*3/uL (ref 0.7–3.1)
Lymphs: 40 %
MCH: 29.7 pg (ref 26.6–33.0)
MCHC: 34 g/dL (ref 31.5–35.7)
MCV: 87 fL (ref 79–97)
Monocytes Absolute: 0.5 10*3/uL (ref 0.1–0.9)
Monocytes: 8 %
Neutrophils Absolute: 2.7 10*3/uL (ref 1.4–7.0)
Neutrophils: 48 %
Platelets: 253 10*3/uL (ref 150–450)
RBC: 4.45 x10E6/uL (ref 3.77–5.28)
RDW: 12.5 % (ref 11.7–15.4)
WBC: 5.7 10*3/uL (ref 3.4–10.8)

## 2023-08-27 LAB — CMP14+EGFR
ALT: 16 IU/L (ref 0–32)
AST: 16 IU/L (ref 0–40)
Albumin: 4.6 g/dL (ref 3.8–4.9)
Alkaline Phosphatase: 136 IU/L — ABNORMAL HIGH (ref 44–121)
BUN/Creatinine Ratio: 23 (ref 9–23)
BUN: 20 mg/dL (ref 6–24)
Bilirubin Total: 0.7 mg/dL (ref 0.0–1.2)
CO2: 22 mmol/L (ref 20–29)
Calcium: 10.1 mg/dL (ref 8.7–10.2)
Chloride: 104 mmol/L (ref 96–106)
Creatinine, Ser: 0.86 mg/dL (ref 0.57–1.00)
Globulin, Total: 3.3 g/dL (ref 1.5–4.5)
Glucose: 101 mg/dL — ABNORMAL HIGH (ref 70–99)
Potassium: 4.1 mmol/L (ref 3.5–5.2)
Sodium: 141 mmol/L (ref 134–144)
Total Protein: 7.9 g/dL (ref 6.0–8.5)
eGFR: 79 mL/min/{1.73_m2} (ref >59)

## 2023-08-27 LAB — TSH+FREE T4
Free T4: 1.09 ng/dL (ref 0.82–1.77)
TSH: 6.21 u[IU]/mL — ABNORMAL HIGH (ref 0.450–4.500)

## 2023-08-27 LAB — LIPID PANEL
Chol/HDL Ratio: 2 ratio (ref 0.0–4.4)
Cholesterol, Total: 129 mg/dL (ref 100–199)
HDL: 64 mg/dL (ref >39)
LDL Chol Calc (NIH): 49 mg/dL (ref 0–99)
Triglycerides: 80 mg/dL (ref 0–149)
VLDL Cholesterol Cal: 16 mg/dL (ref 5–40)

## 2023-08-27 LAB — HEPATITIS C ANTIBODY: Hep C Virus Ab: REACTIVE — AB

## 2023-08-27 LAB — HEMOGLOBIN A1C
Est. average glucose Bld gHb Est-mCnc: 111 mg/dL
Hgb A1c MFr Bld: 5.5 % (ref 4.8–5.6)

## 2023-08-27 LAB — VITAMIN D 25 HYDROXY (VIT D DEFICIENCY, FRACTURES): Vit D, 25-Hydroxy: 14.5 ng/mL — ABNORMAL LOW (ref 30.0–100.0)

## 2023-08-28 LAB — HIV ANTIBODY (ROUTINE TESTING W REFLEX): HIV Screen 4th Generation wRfx: NONREACTIVE

## 2023-09-04 ENCOUNTER — Other Ambulatory Visit: Payer: Self-pay | Admitting: Family Medicine

## 2023-09-04 ENCOUNTER — Encounter: Payer: Self-pay | Admitting: Family Medicine

## 2023-09-04 DIAGNOSIS — E039 Hypothyroidism, unspecified: Secondary | ICD-10-CM

## 2023-09-04 DIAGNOSIS — B1920 Unspecified viral hepatitis C without hepatic coma: Secondary | ICD-10-CM

## 2023-09-04 DIAGNOSIS — E559 Vitamin D deficiency, unspecified: Secondary | ICD-10-CM

## 2023-09-04 MED ORDER — LEVOTHYROXINE SODIUM 50 MCG PO TABS: 50.0000 ug | ORAL_TABLET | Freq: Every day | ORAL | 5 refills | Status: AC

## 2023-09-04 MED ORDER — VITAMIN D (ERGOCALCIFEROL) 1.25 MG (50000 UNIT) PO CAPS: 50000.0000 [IU] | ORAL_CAPSULE | ORAL | 1 refills | Status: AC

## 2023-09-17 ENCOUNTER — Other Ambulatory Visit: Payer: Self-pay | Admitting: Family Medicine

## 2023-09-17 DIAGNOSIS — G629 Polyneuropathy, unspecified: Secondary | ICD-10-CM

## 2023-09-20 ENCOUNTER — Other Ambulatory Visit: Payer: Self-pay | Admitting: Family Medicine

## 2023-09-20 DIAGNOSIS — E785 Hyperlipidemia, unspecified: Secondary | ICD-10-CM

## 2023-09-20 MED ORDER — ATORVASTATIN CALCIUM 40 MG PO TABS: 40.0000 mg | ORAL_TABLET | Freq: Every day | ORAL | 5 refills | Status: DC

## 2023-09-20 NOTE — Telephone Encounter (Signed)
 Copied from CRM [id number]. Topic: Clinical - Medication Refill >> Sep 20, 2023 12:14 PM Turkey B wrote: Most Recent Primary Care Visit:  Provider: Gilmore Laroche  Department: [facility]  Visit Type: OFFICE VISIT  Date: 08/26/2023  Medication: atorvastatin (LIPITOR) 40 MG tablet  Has the patient contacted their pharmacy? yes (Agent: If yes, when and what did the pharmacy advise?)contact pcp, pt says called pharmacy last month about med, nothing was in system on this  Is this the correct pharmacy for this prescription? yes   This is the patient's preferred pharmacy:  [facility] [address], [place] - [address] [place] [address] Phone: [phone] Fax: [phone]    Has the prescription been filled recently? no  Is the patient out of the medication? yes  Has the patient been seen for an appointment in the last year OR does the patient have an upcoming appointment? yes  Can we respond through MyChart? yes  Agent: Please be advised that Rx refills may take up to 3 business days. We ask that you follow-up with your pharmacy.

## 2023-09-21 ENCOUNTER — Other Ambulatory Visit: Payer: Self-pay

## 2023-09-21 DIAGNOSIS — I10 Essential (primary) hypertension: Secondary | ICD-10-CM

## 2023-09-21 MED ORDER — AMLODIPINE-OLMESARTAN 10-40 MG PO TABS: 1.0000 | ORAL_TABLET | Freq: Every day | ORAL | 3 refills | Status: DC

## 2023-10-25 ENCOUNTER — Other Ambulatory Visit: Payer: Self-pay | Admitting: Family Medicine

## 2023-10-25 DIAGNOSIS — G629 Polyneuropathy, unspecified: Secondary | ICD-10-CM

## 2023-10-28 ENCOUNTER — Other Ambulatory Visit: Payer: Self-pay | Admitting: Family Medicine

## 2023-10-28 DIAGNOSIS — G629 Polyneuropathy, unspecified: Secondary | ICD-10-CM

## 2023-11-22 ENCOUNTER — Other Ambulatory Visit: Payer: Self-pay | Admitting: Family Medicine

## 2023-11-22 DIAGNOSIS — E785 Hyperlipidemia, unspecified: Secondary | ICD-10-CM

## 2023-12-24 ENCOUNTER — Ambulatory Visit: Payer: 59 | Admitting: Family Medicine

## 2024-01-10 ENCOUNTER — Ambulatory Visit (HOSPITAL_COMMUNITY)

## 2024-01-11 ENCOUNTER — Other Ambulatory Visit: Payer: Self-pay | Admitting: Family Medicine

## 2024-01-11 ENCOUNTER — Other Ambulatory Visit: Payer: Self-pay

## 2024-01-11 ENCOUNTER — Encounter (HOSPITAL_COMMUNITY): Payer: Self-pay

## 2024-01-11 ENCOUNTER — Emergency Department (HOSPITAL_COMMUNITY)
Admission: EM | Admit: 2024-01-11 | Discharge: 2024-01-11 | Disposition: A | Discharge status: Home / Self Care | Payer: MEDICAID | Attending: Emergency Medicine | Admitting: Emergency Medicine

## 2024-01-11 DIAGNOSIS — M25552 Pain in left hip: Secondary | ICD-10-CM | POA: Diagnosis present

## 2024-01-11 DIAGNOSIS — X500XXA Overexertion from strenuous movement or load, initial encounter: Secondary | ICD-10-CM | POA: Insufficient documentation

## 2024-01-11 DIAGNOSIS — R109 Unspecified abdominal pain: Secondary | ICD-10-CM | POA: Diagnosis not present

## 2024-01-11 DIAGNOSIS — Z7982 Long term (current) use of aspirin: Secondary | ICD-10-CM | POA: Insufficient documentation

## 2024-01-11 DIAGNOSIS — M545 Low back pain, unspecified: Secondary | ICD-10-CM | POA: Diagnosis not present

## 2024-01-11 DIAGNOSIS — Z79899 Other long term (current) drug therapy: Secondary | ICD-10-CM | POA: Diagnosis not present

## 2024-01-11 DIAGNOSIS — I10 Essential (primary) hypertension: Secondary | ICD-10-CM | POA: Insufficient documentation

## 2024-01-11 DIAGNOSIS — G629 Polyneuropathy, unspecified: Secondary | ICD-10-CM

## 2024-01-11 DIAGNOSIS — K219 Gastro-esophageal reflux disease without esophagitis: Secondary | ICD-10-CM | POA: Insufficient documentation

## 2024-01-11 DIAGNOSIS — Y990 Civilian activity done for income or pay: Secondary | ICD-10-CM | POA: Diagnosis not present

## 2024-01-11 DIAGNOSIS — J449 Chronic obstructive pulmonary disease, unspecified: Secondary | ICD-10-CM | POA: Insufficient documentation

## 2024-01-11 MED ORDER — NAPROXEN 375 MG PO TABS: 375.0000 mg | ORAL_TABLET | Freq: Two times a day (BID) | ORAL | 0 refills | Status: AC

## 2024-01-11 MED ORDER — NAPROXEN 500 MG PO TABS: 500.0000 mg | ORAL_TABLET | Freq: Two times a day (BID) | ORAL | 0 refills | Status: DC

## 2024-01-11 MED ORDER — KETOROLAC TROMETHAMINE 15 MG/ML IJ SOLN
15.0000 mg | Freq: Once | INTRAMUSCULAR | Status: AC
Start: 2024-01-11 — End: 2024-01-11
  Administered 2024-01-11: 15 mg via INTRAMUSCULAR
  Filled 2024-01-11: qty 1

## 2024-01-11 MED ORDER — LIDOCAINE 5 % EX PTCH: 1.0000 | MEDICATED_PATCH | CUTANEOUS | 0 refills | Status: AC

## 2024-01-11 MED ORDER — LIDOCAINE 5 % EX PTCH
1.0000 | MEDICATED_PATCH | CUTANEOUS | Status: DC
  Administered 2024-01-11: 1 via TRANSDERMAL
  Filled 2024-01-11: qty 1

## 2024-01-11 MED ORDER — METHOCARBAMOL 500 MG PO TABS
500.0000 mg | ORAL_TABLET | Freq: Once | ORAL | Status: AC
Start: 2024-01-11 — End: 2024-01-11
  Administered 2024-01-11: 500 mg via ORAL
  Filled 2024-01-11: qty 1

## 2024-01-11 MED ORDER — METHOCARBAMOL 500 MG PO TABS: 500.0000 mg | ORAL_TABLET | Freq: Four times a day (QID) | ORAL | 0 refills | Status: AC | PRN

## 2024-01-11 NOTE — ED Triage Notes (Signed)
 Pt arrived via POV c/o left side flank and hip pain. Pt reports it does not feel like a kidney stone. Pt reports pain is more in her left hip and goes down her leg.

## 2024-01-11 NOTE — ED Triage Notes (Signed)
 Pt reports pain has been present since the 4th of July. Pt denies injury.

## 2024-01-11 NOTE — ED Provider Notes (Signed)
 [facility] AT [facility] Provider Note   CSN: [id number] Arrival date & time: 01/11/24  1432     Patient presents with: Flank Pain   Barbara Snow is a 58 y.o. female.  She has PMH of TIA, hypertension, COPD, GERD, obesity.  Presents to ER for evaluation of left hip pain ongoing since July 4.  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  She had gradually increased pulling sensation to the left buttock area that is worse with walking.  She tried Tylenol  without significant relief.  She has fever chills, no urinary symptoms, no nausea or vomiting.  No hematuria.  Pain in her back or abdomen.  She is not having unintentional weight loss, no history of IVDA.  No saddle anesthesia or paresthesia, no bowel or bladder incontinence.  Pain does not radiate.    Flank Pain       Prior to Admission medications   Medication Sig Start Date End Date Taking? Authorizing Provider  albuterol  (VENTOLIN  HFA) 108 (90 Base) MCG/ACT inhaler Inhale 1-2 puffs into the lungs every 6 (six) hours as needed for wheezing or shortness of breath. 09/16/22   Randol Simmonds, MD  amLODipine -olmesartan  (AZOR ) 10-40 MG tablet Take 1 tablet by mouth daily. 09/21/23   Zarwolo, Gloria, FNP  aspirin  EC 81 MG tablet Take 81 mg by mouth daily. Swallow whole.    [provider]  atorvastatin  (LIPITOR) 40 MG tablet Take 1 tablet by mouth once daily 11/23/23   Zarwolo, Gloria, FNP  gabapentin  (NEURONTIN ) 100 MG capsule Take 1 capsule by mouth at bedtime 10/25/23   Zarwolo, Gloria, FNP  levothyroxine  (SYNTHROID ) 50 MCG tablet Take 1 tablet (50 mcg total) by mouth daily. 09/04/23   Zarwolo, Gloria, FNP  metroNIDAZOLE  (FLAGYL ) 500 MG tablet Take 1 tablet (500 mg total) by mouth 2 (two) times daily. 05/28/23   Ozan, Jennifer, DO  pantoprazole  (PROTONIX ) 40 MG tablet Take 1 tablet (40 mg total) by mouth 2 (two) times daily. 06/01/22   Golda Lynwood PARAS, MD   potassium chloride  (KLOR-CON  M) 10 MEQ tablet Take 1 tablet (10 mEq total) by mouth 2 (two) times daily. 08/23/23   Zarwolo, Gloria, FNP  Vitamin D , Ergocalciferol , (DRISDOL ) 1.25 MG (50000 UNIT) CAPS capsule Take 1 capsule (50,000 Units total) by mouth every 7 (seven) days. 09/04/23   Zarwolo, Gloria, FNP    Allergies: Patient has no known allergies.    Review of Systems  Genitourinary:  Positive for flank pain.    Updated Vital Signs BP (!) 147/98 (BP Location: Right Arm)   Pulse 81   Temp 98.6 F (37 C) (Oral)   Resp 19   Ht 5' 6 (1.676 m)   Wt 128 kg   SpO2 98%   BMI 45.55 kg/m   Physical Exam Vitals and nursing note reviewed.  Constitutional:      General: She is not in acute distress.    Appearance: She is well-developed.  HENT:     Head: Normocephalic and atraumatic.     Mouth/Throat:     Mouth: Mucous membranes are moist.  Eyes:     Conjunctiva/sclera: Conjunctivae normal.  Cardiovascular:     Rate and Rhythm: Normal rate and regular rhythm.     Heart sounds: No murmur heard. Pulmonary:     Effort: Pulmonary effort is normal. No respiratory distress.     Breath sounds: Normal breath sounds.  Abdominal:     Palpations: Abdomen is soft.     Tenderness: There is no abdominal tenderness.  Musculoskeletal:        General: No swelling.     Cervical back: Neck supple.     Comments: Of left straight leg raise, tenderness to left lateral buttock.  No induration, no overlying skin changes.  No crepitus.  Normal range of motion of left hip knee and ankle.  Patient can bear weight and ambulate though gait is slightly antalgic.  DP and PT pulses on the left foot are intact.  Skin:    General: Skin is warm and dry.     Capillary Refill: Capillary refill takes less than 2 seconds.  Neurological:     General: No focal deficit present.     Mental Status: She is alert and oriented to person, place, and time.     Sensory: No sensory deficit.     Motor: No weakness.   Psychiatric:        Mood and Affect: Mood normal.     (all labs ordered are listed, but only abnormal results are displayed) Labs Reviewed - No data to display  EKG: None  Radiology: No results found.   Procedures   Medications Ordered in the ED - No data to display                                  Medical Decision Making Differential diagnosis includes but limited to muscle strain, contusion, fracture, other  ED course: Patient with a left buttock pain for a week and a half without discrete injury.  She is able to bear weight and ambulate though her gait is slightly antalgic.  There is no overlying skin changes, no induration or fluctuance to suggest there is a deep space abscess.  She has normal range of motion of the left hip and this does not suggest a septic arthritis.  She has no lumbar pain to suggest any spinal etiology.  Pain is worse with walking.  She was given Toradol  and methocarbamol  for discomfort and is feeling much better we will discharge with short course of NSAIDs and methocarbamol  and is advised on follow-up return precautions, given a work note to help her rest advised follow with PCP and/orthopedics.  I did offer x-ray the patient has no bony tenderness, had a discrete injury and she does not feel this is necessary.  I feel this is reasonable course of action at this time.  Risk Prescription drug management.        Final diagnoses:  None    ED Discharge Orders     None          Suellen Sherran DELENA DEVONNA 01/12/24 1309    Freddi Hamilton, MD 01/14/24 1517

## 2024-01-11 NOTE — Discharge Instructions (Addendum)
 You are seen in the ER today for evaluation of left hip pain.  This is likely muscle strain versus tendinitis versus bursitis.  We will treat you with muscle relaxers, lidocaine  patches, naproxen  and over-the-counter Tylenol .

## 2024-02-24 ENCOUNTER — Ambulatory Visit: Admitting: Family Medicine

## 2024-02-29 ENCOUNTER — Other Ambulatory Visit: Payer: Self-pay | Admitting: Family Medicine

## 2024-02-29 DIAGNOSIS — G629 Polyneuropathy, unspecified: Secondary | ICD-10-CM

## 2024-04-04 ENCOUNTER — Other Ambulatory Visit: Payer: Self-pay | Admitting: Family Medicine

## 2024-04-04 DIAGNOSIS — G629 Polyneuropathy, unspecified: Secondary | ICD-10-CM

## 2024-06-05 ENCOUNTER — Other Ambulatory Visit: Payer: Self-pay | Admitting: Family Medicine

## 2024-06-05 DIAGNOSIS — E785 Hyperlipidemia, unspecified: Secondary | ICD-10-CM

## 2024-06-05 DIAGNOSIS — G629 Polyneuropathy, unspecified: Secondary | ICD-10-CM

## 2024-06-12 ENCOUNTER — Telehealth: Payer: Self-pay | Admitting: Family Medicine

## 2024-06-12 DIAGNOSIS — G479 Sleep disorder, unspecified: Secondary | ICD-10-CM | POA: Insufficient documentation

## 2024-06-12 DIAGNOSIS — E785 Hyperlipidemia, unspecified: Secondary | ICD-10-CM

## 2024-06-12 DIAGNOSIS — G629 Polyneuropathy, unspecified: Secondary | ICD-10-CM

## 2024-06-12 DIAGNOSIS — E782 Mixed hyperlipidemia: Secondary | ICD-10-CM

## 2024-06-12 DIAGNOSIS — E559 Vitamin D deficiency, unspecified: Secondary | ICD-10-CM

## 2024-06-12 DIAGNOSIS — R7301 Impaired fasting glucose: Secondary | ICD-10-CM

## 2024-06-12 DIAGNOSIS — E038 Other specified hypothyroidism: Secondary | ICD-10-CM

## 2024-06-12 MED ORDER — GABAPENTIN 100 MG PO CAPS: 100.0000 mg | ORAL_CAPSULE | Freq: Every day | ORAL | 1 refills | Status: AC

## 2024-06-12 MED ORDER — HYDROXYZINE PAMOATE 25 MG PO CAPS: 25.0000 mg | ORAL_CAPSULE | Freq: Every evening | ORAL | 1 refills | Status: AC | PRN

## 2024-06-12 MED ORDER — ATORVASTATIN CALCIUM 40 MG PO TABS: 40.0000 mg | ORAL_TABLET | Freq: Every day | ORAL | 1 refills | Status: AC

## 2024-06-12 NOTE — Assessment & Plan Note (Signed)
 A prescription for Hydroxyzine  25 mg has been sent to your pharmacy. Take 30 minutes to 1 hour before bedtime to help with sleep. Monitor for excessive drowsiness; if needed, reduce to 12.5 mg (half a tablet).

## 2024-06-12 NOTE — Assessment & Plan Note (Signed)
 The patient is inquiring about weight-loss options. She was informed that she is not a candidate for phentermine given her history of TIA. She is encouraged to contact her insurance to verify which weight-loss medications are covered. She is also encouraged to implement lifestyle changes, including following a heart-healthy diet and increasing physical activity.

## 2024-06-12 NOTE — Progress Notes (Signed)
 Virtual Visit via Video Note  I connected with Barbara Snow on 06/12/2024 at 10:00 AM EST by a video enabled telemedicine application and verified that I am speaking with the correct person using two identifiers.  Patient Location: Home Provider Location: Office/Clinic  I discussed the limitations, risks, security, and privacy concerns of performing an evaluation and management service by video and the availability of in person appointments. I also discussed with the patient that there may be a patient responsible charge related to this service. The patient expressed understanding and agreed to proceed.  Subjective: PCP: Edman Meade PEDLAR, FNP  No chief complaint on file.  HPI The patient reports having trouble sleeping lately. She states that she goes to bed around 10-11 p.m. but wakes up around 3 a.m. and is unable to fall back asleep.  ROS: Per HPI Current Medications[1]  Observations/Objective: There were no vitals filed for this visit. Physical Exam Patient is well-developed, well-nourished in no acute distress.  Resting comfortably at home.  Head is normocephalic, atraumatic.  No labored breathing.  Speech is clear and coherent with logical content.  Patient is alert and oriented at baseline.   Assessment and Plan: Sleep disturbance Assessment & Plan: A prescription for Hydroxyzine  25 mg has been sent to your pharmacy. Take 30 minutes to 1 hour before bedtime to help with sleep. Monitor for excessive drowsiness; if needed, reduce to 12.5 mg (half a tablet).   Orders: -     hydrOXYzine  Pamoate; Take 1 capsule (25 mg total) by mouth at bedtime as needed.  Dispense: 30 capsule; Refill: 1  Morbid obesity (HCC) Assessment & Plan: The patient is inquiring about weight-loss options. She was informed that she is not a candidate for phentermine given her history of TIA. She is encouraged to contact her insurance to verify which weight-loss medications are covered. She is  also encouraged to implement lifestyle changes, including following a heart-healthy diet and increasing physical activity.    Peripheral neuritis -     Gabapentin ; Take 1 capsule (100 mg total) by mouth at bedtime.  Dispense: 30 capsule; Refill: 1  Hyperlipidemia, unspecified hyperlipidemia type -     Atorvastatin  Calcium ; Take 1 tablet (40 mg total) by mouth daily.  Dispense: 90 tablet; Refill: 1  IFG (impaired fasting glucose) -     Hemoglobin A1c  Vitamin D  deficiency -     VITAMIN D  25 Hydroxy (Vit-D Deficiency, Fractures)  TSH (thyroid -stimulating hormone deficiency) -     TSH + free T4  Mixed hyperlipidemia -     Lipid panel -     CMP14+EGFR -     CBC with Differential/Platelet  Note: This chart has been completed using [employer] (consulting) software, and while attempts have been made to ensure accuracy, certain words and phrases may not be transcribed as intended.    Follow Up Instructions: No follow-ups on file.   I discussed the assessment and treatment plan with the patient. The patient was provided an opportunity to ask questions, and all were answered. The patient agreed with the plan and demonstrated an understanding of the instructions.   The patient was advised to call back or seek an in-person evaluation if the symptoms worsen or if the condition fails to improve as anticipated.  The above assessment and management plan was discussed with the patient. The patient verbalized understanding of and has agreed to the management plan.   Meade PEDLAR Edman, FNP     [1]  Current  Outpatient Medications:    hydrOXYzine  (VISTARIL ) 25 MG capsule, Take 1 capsule (25 mg total) by mouth at bedtime as needed., Disp: 30 capsule, Rfl: 1   albuterol  (VENTOLIN  HFA) 108 (90 Base) MCG/ACT inhaler, Inhale 1-2 puffs into the lungs every 6 (six) hours as needed for wheezing or shortness of breath., Disp: 8 g, Rfl: 0   amLODipine -olmesartan  (AZOR ) 10-40 MG tablet, Take 1 tablet by  mouth daily., Disp: 90 tablet, Rfl: 3   aspirin  EC 81 MG tablet, Take 81 mg by mouth daily. Swallow whole., Disp: , Rfl:    atorvastatin  (LIPITOR) 40 MG tablet, Take 1 tablet (40 mg total) by mouth daily., Disp: 90 tablet, Rfl: 1   gabapentin  (NEURONTIN ) 100 MG capsule, Take 1 capsule (100 mg total) by mouth at bedtime., Disp: 30 capsule, Rfl: 1   levothyroxine  (SYNTHROID ) 50 MCG tablet, Take 1 tablet (50 mcg total) by mouth daily., Disp: 30 tablet, Rfl: 5   lidocaine  (LIDODERM ) 5 %, Place 1 patch onto the skin daily. Remove & Discard patch within 12 hours or as directed by MD, Disp: 30 patch, Rfl: 0   methocarbamol  (ROBAXIN ) 500 MG tablet, Take 1 tablet (500 mg total) by mouth every 6 (six) hours as needed for muscle spasms., Disp: 20 tablet, Rfl: 0   metroNIDAZOLE  (FLAGYL ) 500 MG tablet, Take 1 tablet (500 mg total) by mouth 2 (two) times daily., Disp: 14 tablet, Rfl: 0   naproxen  (NAPROSYN ) 375 MG tablet, Take 1 tablet (375 mg total) by mouth 2 (two) times daily., Disp: 20 tablet, Rfl: 0   pantoprazole  (PROTONIX ) 40 MG tablet, Take 1 tablet (40 mg total) by mouth 2 (two) times daily., Disp: 30 tablet, Rfl: 5   potassium chloride  (KLOR-CON  M) 10 MEQ tablet, Take 1 tablet (10 mEq total) by mouth 2 (two) times daily., Disp: 20 tablet, Rfl: 0   Vitamin D , Ergocalciferol , (DRISDOL ) 1.25 MG (50000 UNIT) CAPS capsule, Take 1 capsule (50,000 Units total) by mouth every 7 (seven) days., Disp: 20 capsule, Rfl: 1

## 2024-07-10 ENCOUNTER — Telehealth: Payer: Self-pay | Admitting: Pharmacy Technician

## 2024-07-10 ENCOUNTER — Other Ambulatory Visit (HOSPITAL_COMMUNITY): Payer: Self-pay

## 2024-07-10 ENCOUNTER — Other Ambulatory Visit: Payer: Self-pay | Admitting: Family Medicine

## 2024-07-10 DIAGNOSIS — I10 Essential (primary) hypertension: Secondary | ICD-10-CM

## 2024-07-10 MED ORDER — AMLODIPINE BESYLATE 10 MG PO TABS: 10.0000 mg | ORAL_TABLET | Freq: Every day | ORAL | 1 refills | Status: AC

## 2024-07-10 MED ORDER — OLMESARTAN MEDOXOMIL 40 MG PO TABS: 40.0000 mg | ORAL_TABLET | Freq: Every day | ORAL | 1 refills | Status: AC

## 2024-07-10 NOTE — Telephone Encounter (Signed)
 Individual prescriptions for amlodipine  10 mg daily and olmesartan  40 mg daily have been sent to the patients pharmacy.

## 2024-07-10 NOTE — Telephone Encounter (Addendum)
 Pharmacy Patient Advocate Encounter   Received notification from Onbase CMM KEY that prior authorization for amLODIPine -Olmesartan  10-40MG  tablets is required/requested.   Insurance verification completed.   The patient is insured through [employer].   Per test claim: Irbesartan, Losartan, or Amlodipin and Olmesartan  individually are preferred by the insurance. If suggested medication is appropriate, Please send in a new RX and discontinue this one. If not, please advise as to why it's not appropriate so that we may request a Prior Authorization. Please note, some preferred medications may still require a PA.  If the suggested medications have not been trialed and there are no contraindications to their use, the PA will not be submitted, as it will not be approved.  CMM Key: [id number]

## 2024-07-11 NOTE — Telephone Encounter (Signed)
 Thanks

## 2024-11-22 ENCOUNTER — Ambulatory Visit: Payer: Self-pay | Admitting: Nurse Practitioner
